# Patient Record
Sex: Female | Born: 1955 | Race: Black or African American | Hispanic: No | Marital: Married | Smoking: Never smoker
Health system: Southern US, Community
[De-identification: ages and names within clinical notes are randomized; demographics above are authoritative.]

## PROBLEM LIST (undated history)

## (undated) DIAGNOSIS — Z8739 Personal history of other diseases of the musculoskeletal system and connective tissue: Secondary | ICD-10-CM

## (undated) DIAGNOSIS — J45909 Unspecified asthma, uncomplicated: Secondary | ICD-10-CM

## (undated) DIAGNOSIS — Z1371 Encounter for nonprocreative screening for genetic disease carrier status: Secondary | ICD-10-CM

## (undated) DIAGNOSIS — D759 Disease of blood and blood-forming organs, unspecified: Secondary | ICD-10-CM

## (undated) DIAGNOSIS — N189 Chronic kidney disease, unspecified: Secondary | ICD-10-CM

## (undated) DIAGNOSIS — D693 Immune thrombocytopenic purpura: Secondary | ICD-10-CM

## (undated) DIAGNOSIS — Z803 Family history of malignant neoplasm of breast: Secondary | ICD-10-CM

## (undated) HISTORY — DX: Immune thrombocytopenic purpura: D69.3

## (undated) HISTORY — PX: HAND SURGERY: SHX662

## (undated) HISTORY — DX: Personal history of other diseases of the musculoskeletal system and connective tissue: Z87.39

## (undated) HISTORY — PX: BREAST EXCISIONAL BIOPSY: SUR124

## (undated) HISTORY — PX: TUBAL LIGATION: SHX77

## (undated) HISTORY — DX: Chronic kidney disease, unspecified: N18.9

## (undated) HISTORY — DX: Encounter for nonprocreative screening for genetic disease carrier status: Z13.71

## (undated) HISTORY — DX: Family history of malignant neoplasm of breast: Z80.3

---

## 1989-08-11 HISTORY — PX: OTHER SURGICAL HISTORY: SHX169

## 2004-05-11 ENCOUNTER — Ambulatory Visit: Payer: Self-pay | Admitting: Internal Medicine

## 2004-06-24 ENCOUNTER — Ambulatory Visit: Payer: Self-pay | Admitting: Internal Medicine

## 2004-07-11 ENCOUNTER — Ambulatory Visit: Payer: Self-pay | Admitting: Internal Medicine

## 2004-08-21 ENCOUNTER — Ambulatory Visit: Payer: Self-pay | Admitting: Internal Medicine

## 2004-09-11 ENCOUNTER — Ambulatory Visit: Payer: Self-pay | Admitting: Internal Medicine

## 2004-09-13 ENCOUNTER — Ambulatory Visit: Payer: Self-pay | Admitting: Unknown Physician Specialty

## 2004-10-21 ENCOUNTER — Ambulatory Visit: Payer: Self-pay | Admitting: Internal Medicine

## 2004-11-09 ENCOUNTER — Ambulatory Visit: Payer: Self-pay | Admitting: Internal Medicine

## 2004-12-19 ENCOUNTER — Ambulatory Visit: Payer: Self-pay | Admitting: Internal Medicine

## 2005-01-09 ENCOUNTER — Ambulatory Visit: Payer: Self-pay | Admitting: Internal Medicine

## 2005-02-21 ENCOUNTER — Ambulatory Visit: Payer: Self-pay | Admitting: Internal Medicine

## 2005-03-11 ENCOUNTER — Ambulatory Visit: Payer: Self-pay | Admitting: Internal Medicine

## 2005-05-22 ENCOUNTER — Ambulatory Visit: Payer: Self-pay | Admitting: Internal Medicine

## 2005-06-11 ENCOUNTER — Ambulatory Visit: Payer: Self-pay | Admitting: Internal Medicine

## 2005-08-20 ENCOUNTER — Ambulatory Visit: Payer: Self-pay | Admitting: Internal Medicine

## 2005-09-11 ENCOUNTER — Ambulatory Visit: Payer: Self-pay | Admitting: Internal Medicine

## 2005-10-13 ENCOUNTER — Ambulatory Visit: Payer: Self-pay | Admitting: Unknown Physician Specialty

## 2005-11-18 ENCOUNTER — Ambulatory Visit: Payer: Self-pay | Admitting: Internal Medicine

## 2005-12-09 ENCOUNTER — Ambulatory Visit: Payer: Self-pay | Admitting: Internal Medicine

## 2006-02-27 ENCOUNTER — Ambulatory Visit: Payer: Self-pay | Admitting: Internal Medicine

## 2006-03-11 ENCOUNTER — Ambulatory Visit: Payer: Self-pay | Admitting: Internal Medicine

## 2006-05-28 ENCOUNTER — Ambulatory Visit: Payer: Self-pay | Admitting: Internal Medicine

## 2006-06-11 ENCOUNTER — Ambulatory Visit: Payer: Self-pay | Admitting: Internal Medicine

## 2006-09-03 ENCOUNTER — Ambulatory Visit: Payer: Self-pay | Admitting: Internal Medicine

## 2006-09-11 ENCOUNTER — Ambulatory Visit: Payer: Self-pay | Admitting: Internal Medicine

## 2006-10-26 ENCOUNTER — Ambulatory Visit: Payer: Self-pay | Admitting: Unknown Physician Specialty

## 2006-11-23 ENCOUNTER — Ambulatory Visit: Payer: Self-pay | Admitting: Internal Medicine

## 2006-12-10 ENCOUNTER — Ambulatory Visit: Payer: Self-pay | Admitting: Internal Medicine

## 2007-02-09 ENCOUNTER — Ambulatory Visit: Payer: Self-pay | Admitting: Internal Medicine

## 2007-02-22 ENCOUNTER — Ambulatory Visit: Payer: Self-pay | Admitting: Internal Medicine

## 2007-03-12 ENCOUNTER — Ambulatory Visit: Payer: Self-pay | Admitting: Internal Medicine

## 2007-06-21 ENCOUNTER — Ambulatory Visit: Payer: Self-pay | Admitting: Internal Medicine

## 2007-07-12 ENCOUNTER — Ambulatory Visit: Payer: Self-pay | Admitting: Internal Medicine

## 2007-10-19 ENCOUNTER — Ambulatory Visit: Payer: Self-pay | Admitting: Internal Medicine

## 2007-11-01 ENCOUNTER — Ambulatory Visit: Payer: Self-pay | Admitting: Unknown Physician Specialty

## 2007-11-10 ENCOUNTER — Ambulatory Visit: Payer: Self-pay | Admitting: Internal Medicine

## 2008-02-09 ENCOUNTER — Ambulatory Visit: Payer: Self-pay | Admitting: Internal Medicine

## 2008-02-22 ENCOUNTER — Ambulatory Visit: Payer: Self-pay | Admitting: Internal Medicine

## 2008-03-11 ENCOUNTER — Ambulatory Visit: Payer: Self-pay | Admitting: Internal Medicine

## 2008-05-31 ENCOUNTER — Ambulatory Visit: Payer: Self-pay | Admitting: Internal Medicine

## 2008-06-11 ENCOUNTER — Ambulatory Visit: Payer: Self-pay | Admitting: Internal Medicine

## 2008-08-29 ENCOUNTER — Ambulatory Visit: Payer: Self-pay | Admitting: Internal Medicine

## 2008-09-06 ENCOUNTER — Ambulatory Visit: Payer: Self-pay | Admitting: Internal Medicine

## 2008-09-11 ENCOUNTER — Ambulatory Visit: Payer: Self-pay | Admitting: Internal Medicine

## 2008-09-18 HISTORY — PX: COLONOSCOPY: SHX174

## 2008-10-09 ENCOUNTER — Ambulatory Visit: Payer: Self-pay | Admitting: Internal Medicine

## 2008-11-05 ENCOUNTER — Ambulatory Visit: Payer: Self-pay | Admitting: Unknown Physician Specialty

## 2008-11-06 ENCOUNTER — Ambulatory Visit: Payer: Self-pay | Admitting: Unknown Physician Specialty

## 2008-11-09 ENCOUNTER — Ambulatory Visit: Payer: Self-pay | Admitting: Internal Medicine

## 2008-11-13 ENCOUNTER — Ambulatory Visit: Payer: Self-pay | Admitting: Unknown Physician Specialty

## 2008-12-09 ENCOUNTER — Ambulatory Visit: Payer: Self-pay | Admitting: Internal Medicine

## 2009-01-09 ENCOUNTER — Ambulatory Visit: Payer: Self-pay | Admitting: Internal Medicine

## 2009-02-08 ENCOUNTER — Ambulatory Visit: Payer: Self-pay | Admitting: Internal Medicine

## 2009-03-11 ENCOUNTER — Ambulatory Visit: Payer: Self-pay | Admitting: Internal Medicine

## 2009-04-11 ENCOUNTER — Ambulatory Visit: Payer: Self-pay | Admitting: Internal Medicine

## 2009-05-11 ENCOUNTER — Ambulatory Visit: Payer: Self-pay | Admitting: Internal Medicine

## 2009-06-11 ENCOUNTER — Ambulatory Visit: Payer: Self-pay | Admitting: Internal Medicine

## 2009-07-11 ENCOUNTER — Ambulatory Visit: Payer: Self-pay | Admitting: Internal Medicine

## 2009-08-11 ENCOUNTER — Ambulatory Visit: Payer: Self-pay | Admitting: Internal Medicine

## 2009-09-11 ENCOUNTER — Ambulatory Visit: Payer: Self-pay | Admitting: Internal Medicine

## 2009-10-09 ENCOUNTER — Ambulatory Visit: Payer: Self-pay | Admitting: Internal Medicine

## 2009-11-09 ENCOUNTER — Ambulatory Visit: Payer: Self-pay | Admitting: Internal Medicine

## 2009-12-03 ENCOUNTER — Ambulatory Visit: Payer: Self-pay | Admitting: Unknown Physician Specialty

## 2009-12-09 ENCOUNTER — Ambulatory Visit: Payer: Self-pay | Admitting: Internal Medicine

## 2010-01-09 ENCOUNTER — Ambulatory Visit: Payer: Self-pay | Admitting: Internal Medicine

## 2010-01-23 ENCOUNTER — Ambulatory Visit: Payer: Self-pay | Admitting: Internal Medicine

## 2010-02-08 ENCOUNTER — Ambulatory Visit: Payer: Self-pay | Admitting: Internal Medicine

## 2010-03-11 ENCOUNTER — Ambulatory Visit: Payer: Self-pay | Admitting: Internal Medicine

## 2010-04-11 ENCOUNTER — Ambulatory Visit: Payer: Self-pay | Admitting: Internal Medicine

## 2010-05-11 ENCOUNTER — Ambulatory Visit: Payer: Self-pay | Admitting: Internal Medicine

## 2010-06-11 ENCOUNTER — Ambulatory Visit: Payer: Self-pay | Admitting: Internal Medicine

## 2010-07-11 ENCOUNTER — Ambulatory Visit: Payer: Self-pay | Admitting: Internal Medicine

## 2010-08-11 ENCOUNTER — Ambulatory Visit: Payer: Self-pay | Admitting: Internal Medicine

## 2010-09-11 ENCOUNTER — Ambulatory Visit: Payer: Self-pay | Admitting: Internal Medicine

## 2010-10-10 ENCOUNTER — Ambulatory Visit: Payer: Self-pay | Admitting: Internal Medicine

## 2010-11-10 ENCOUNTER — Ambulatory Visit: Payer: Self-pay | Admitting: Internal Medicine

## 2010-12-10 ENCOUNTER — Ambulatory Visit: Payer: Self-pay | Admitting: Internal Medicine

## 2010-12-11 ENCOUNTER — Ambulatory Visit: Payer: Self-pay | Admitting: Unknown Physician Specialty

## 2011-01-10 ENCOUNTER — Ambulatory Visit: Payer: Self-pay | Admitting: Internal Medicine

## 2011-02-09 ENCOUNTER — Ambulatory Visit: Payer: Self-pay | Admitting: Internal Medicine

## 2011-03-12 ENCOUNTER — Ambulatory Visit: Payer: Self-pay | Admitting: Internal Medicine

## 2011-04-12 ENCOUNTER — Ambulatory Visit: Payer: Self-pay | Admitting: Internal Medicine

## 2011-05-12 ENCOUNTER — Ambulatory Visit: Payer: Self-pay | Admitting: Internal Medicine

## 2011-06-12 ENCOUNTER — Ambulatory Visit: Payer: Self-pay | Admitting: Internal Medicine

## 2011-07-12 ENCOUNTER — Ambulatory Visit: Payer: Self-pay | Admitting: Internal Medicine

## 2011-08-12 ENCOUNTER — Ambulatory Visit: Payer: Self-pay | Admitting: Internal Medicine

## 2011-08-21 LAB — PLATELET COUNT: Platelet: 123 10*3/uL — ABNORMAL LOW (ref 150–440)

## 2011-09-12 ENCOUNTER — Ambulatory Visit: Payer: Self-pay | Admitting: Internal Medicine

## 2011-09-23 LAB — CBC CANCER CENTER
Basophil %: 1.2 %
Comment - H1-Com1: NORMAL
Eosinophil #: 0.2 x10 3/mm (ref 0.0–0.7)
Eosinophil %: 3.9 %
HCT: 39.3 % (ref 35.0–47.0)
Lymphocyte #: 1.8 x10 3/mm (ref 1.0–3.6)
Lymphocyte %: 30.9 %
Lymphocytes: 37 %
MCH: 30.3 pg (ref 26.0–34.0)
MCHC: 32.6 g/dL (ref 32.0–36.0)
Monocyte #: 0.4 x10 3/mm (ref 0.0–0.7)
Neutrophil %: 57 %
Platelet: 123 x10 3/mm — ABNORMAL LOW (ref 150–440)
RBC: 4.23 10*6/uL (ref 3.80–5.20)
Segmented Neutrophils: 53 %

## 2011-10-10 ENCOUNTER — Ambulatory Visit: Payer: Self-pay | Admitting: Internal Medicine

## 2011-10-22 LAB — CANCER CTR PLATELET CT: Platelet: 142 x10 3/mm — ABNORMAL LOW (ref 150–440)

## 2011-11-10 ENCOUNTER — Ambulatory Visit: Payer: Self-pay | Admitting: Internal Medicine

## 2011-12-10 ENCOUNTER — Ambulatory Visit: Payer: Self-pay | Admitting: Internal Medicine

## 2011-12-16 ENCOUNTER — Ambulatory Visit: Payer: Self-pay | Admitting: Unknown Physician Specialty

## 2011-12-22 ENCOUNTER — Ambulatory Visit: Payer: Self-pay | Admitting: Unknown Physician Specialty

## 2011-12-23 LAB — CBC CANCER CENTER
Basophil %: 0.8 %
HCT: 35.6 % (ref 35.0–47.0)
Lymphocyte #: 2.6 x10 3/mm (ref 1.0–3.6)
Lymphocyte %: 43.2 %
MCH: 30.8 pg (ref 26.0–34.0)
MCHC: 33.7 g/dL (ref 32.0–36.0)
Monocyte #: 0.6 x10 3/mm (ref 0.2–0.9)
Monocyte %: 9.3 %
Neutrophil %: 41.6 %
Platelet: 123 x10 3/mm — ABNORMAL LOW (ref 150–440)
RDW: 13.5 % (ref 11.5–14.5)
WBC: 6 x10 3/mm (ref 3.6–11.0)

## 2012-01-10 ENCOUNTER — Ambulatory Visit: Payer: Self-pay | Admitting: Internal Medicine

## 2012-02-09 ENCOUNTER — Ambulatory Visit: Payer: Self-pay | Admitting: Internal Medicine

## 2012-02-27 LAB — CANCER CTR PLATELET CT: Platelet: 36 x10 3/mm — ABNORMAL LOW (ref 150–440)

## 2012-03-02 LAB — CANCER CTR PLATELET CT: Platelet: 94 x10 3/mm — ABNORMAL LOW (ref 150–440)

## 2012-03-09 LAB — CANCER CTR PLATELET CT: Platelet: 151 x10 3/mm (ref 150–440)

## 2012-03-11 ENCOUNTER — Ambulatory Visit: Payer: Self-pay | Admitting: Internal Medicine

## 2012-03-30 ENCOUNTER — Ambulatory Visit: Payer: Self-pay | Admitting: Internal Medicine

## 2012-03-30 LAB — CBC CANCER CENTER
Basophil #: 0.1 x10 3/mm (ref 0.0–0.1)
Eosinophil #: 0.3 x10 3/mm (ref 0.0–0.7)
HCT: 36.8 % (ref 35.0–47.0)
HGB: 12.3 g/dL (ref 12.0–16.0)
Lymphocyte %: 30.7 %
MCH: 30.6 pg (ref 26.0–34.0)
MCHC: 33.4 g/dL (ref 32.0–36.0)
Neutrophil %: 55.6 %
Platelet: 97 x10 3/mm — ABNORMAL LOW (ref 150–440)
RDW: 13.7 % (ref 11.5–14.5)
WBC: 5.5 x10 3/mm (ref 3.6–11.0)

## 2012-03-30 LAB — COMPREHENSIVE METABOLIC PANEL
Alkaline Phosphatase: 105 U/L (ref 50–136)
Anion Gap: 8 (ref 7–16)
Bilirubin,Total: 0.4 mg/dL (ref 0.2–1.0)
Calcium, Total: 9.4 mg/dL (ref 8.5–10.1)
Chloride: 106 mmol/L (ref 98–107)
Creatinine: 1.13 mg/dL (ref 0.60–1.30)
EGFR (African American): >60
Glucose: 119 mg/dL — ABNORMAL HIGH (ref 65–99)
Osmolality: 285 (ref 275–301)
Potassium: 3.9 mmol/L (ref 3.5–5.1)
SGOT(AST): 28 U/L (ref 15–37)
Sodium: 142 mmol/L (ref 136–145)
Total Protein: 7.2 g/dL (ref 6.4–8.2)

## 2012-04-11 ENCOUNTER — Ambulatory Visit: Payer: Self-pay | Admitting: Internal Medicine

## 2012-04-27 LAB — CBC CANCER CENTER
Basophil %: 1.6 %
Eosinophil #: 0.2 x10 3/mm (ref 0.0–0.7)
Eosinophil %: 4.4 %
HGB: 12.4 g/dL (ref 12.0–16.0)
Lymphocyte %: 25.6 %
Monocyte %: 12.8 %
Neutrophil %: 55.6 %
RBC: 3.98 10*6/uL (ref 3.80–5.20)
WBC: 5.3 x10 3/mm (ref 3.6–11.0)

## 2012-05-11 ENCOUNTER — Ambulatory Visit: Payer: Self-pay | Admitting: Internal Medicine

## 2012-05-11 LAB — CANCER CTR PLATELET CT: Platelet: 170 x10 3/mm (ref 150–440)

## 2012-06-08 LAB — CANCER CTR PLATELET CT: Platelet: 106 x10 3/mm — ABNORMAL LOW (ref 150–440)

## 2012-06-11 ENCOUNTER — Ambulatory Visit: Payer: Self-pay | Admitting: Internal Medicine

## 2012-07-11 ENCOUNTER — Ambulatory Visit: Payer: Self-pay | Admitting: Internal Medicine

## 2012-08-11 ENCOUNTER — Ambulatory Visit: Payer: Self-pay | Admitting: Internal Medicine

## 2012-09-11 ENCOUNTER — Ambulatory Visit: Payer: Self-pay | Admitting: Internal Medicine

## 2012-10-05 LAB — CBC CANCER CENTER
Basophil #: 0.1 x10 3/mm (ref 0.0–0.1)
Basophil %: 1.2 %
Eosinophil #: 0.3 x10 3/mm (ref 0.0–0.7)
Eosinophil %: 4.7 %
HCT: 36.2 % (ref 35.0–47.0)
HGB: 12.3 g/dL (ref 12.0–16.0)
Lymphocyte #: 1.7 x10 3/mm (ref 1.0–3.6)
MCH: 30.2 pg (ref 26.0–34.0)
MCHC: 33.9 g/dL (ref 32.0–36.0)
MCV: 89 fL (ref 80–100)
RBC: 4.06 10*6/uL (ref 3.80–5.20)
RDW: 13.4 % (ref 11.5–14.5)
WBC: 5.9 x10 3/mm (ref 3.6–11.0)

## 2012-10-09 ENCOUNTER — Ambulatory Visit: Payer: Self-pay | Admitting: Internal Medicine

## 2012-11-02 LAB — CANCER CTR PLATELET CT: Platelet: 85 x10 3/mm — ABNORMAL LOW (ref 150–440)

## 2012-11-09 ENCOUNTER — Ambulatory Visit: Payer: Self-pay | Admitting: Internal Medicine

## 2012-12-09 ENCOUNTER — Ambulatory Visit: Payer: Self-pay | Admitting: Internal Medicine

## 2012-12-21 ENCOUNTER — Ambulatory Visit: Payer: Self-pay | Admitting: Unknown Physician Specialty

## 2013-01-09 ENCOUNTER — Ambulatory Visit: Payer: Self-pay | Admitting: Internal Medicine

## 2013-02-22 ENCOUNTER — Ambulatory Visit: Payer: Self-pay | Admitting: Internal Medicine

## 2013-02-22 LAB — CANCER CTR PLATELET CT: Platelet: 108 x10 3/mm — ABNORMAL LOW (ref 150–440)

## 2013-02-24 ENCOUNTER — Ambulatory Visit: Payer: Self-pay | Admitting: Physical Medicine and Rehabilitation

## 2013-03-11 ENCOUNTER — Ambulatory Visit: Payer: Self-pay | Admitting: Internal Medicine

## 2013-03-29 LAB — CBC CANCER CENTER
Basophil #: 0.1 x10 3/mm (ref 0.0–0.1)
Basophil %: 1.3 %
Eosinophil %: 2.4 %
HCT: 35.9 % (ref 35.0–47.0)
Lymphocyte #: 2.3 x10 3/mm (ref 1.0–3.6)
Lymphocyte %: 34.5 %
MCH: 29.9 pg (ref 26.0–34.0)
MCV: 88 fL (ref 80–100)
Neutrophil #: 3.5 x10 3/mm (ref 1.4–6.5)
Neutrophil %: 51.9 %
Platelet: 84 x10 3/mm — ABNORMAL LOW (ref 150–440)
RBC: 4.07 10*6/uL (ref 3.80–5.20)

## 2013-04-11 ENCOUNTER — Ambulatory Visit: Payer: Self-pay | Admitting: Internal Medicine

## 2013-05-11 ENCOUNTER — Ambulatory Visit: Payer: Self-pay | Admitting: Internal Medicine

## 2013-06-11 ENCOUNTER — Ambulatory Visit: Payer: Self-pay | Admitting: Internal Medicine

## 2013-06-28 LAB — CANCER CTR PLATELET CT: Platelet: 231 x10 3/mm (ref 150–440)

## 2013-07-11 ENCOUNTER — Ambulatory Visit: Payer: Self-pay | Admitting: Internal Medicine

## 2013-08-09 LAB — CANCER CTR PLATELET CT: Platelet: 124 x10 3/mm — ABNORMAL LOW (ref 150–440)

## 2013-08-11 ENCOUNTER — Ambulatory Visit: Payer: Self-pay | Admitting: Internal Medicine

## 2013-09-19 ENCOUNTER — Ambulatory Visit: Payer: Self-pay | Admitting: Internal Medicine

## 2013-09-20 LAB — CANCER CTR PLATELET CT: PLATELETS: 100 x10 3/mm — AB (ref 150–440)

## 2013-10-09 ENCOUNTER — Ambulatory Visit: Payer: Self-pay | Admitting: Internal Medicine

## 2013-10-20 ENCOUNTER — Ambulatory Visit: Payer: Self-pay | Admitting: Emergency Medicine

## 2013-10-20 LAB — PREGNANCY, URINE: Pregnancy Test, Urine: NEGATIVE m[IU]/mL

## 2013-10-26 ENCOUNTER — Other Ambulatory Visit: Payer: Self-pay | Admitting: Orthopedic Surgery

## 2013-11-01 LAB — CANCER CTR PLATELET CT: PLATELETS: 87 x10 3/mm — AB (ref 150–440)

## 2013-11-09 ENCOUNTER — Ambulatory Visit: Payer: Self-pay | Admitting: Internal Medicine

## 2013-11-15 ENCOUNTER — Encounter (HOSPITAL_COMMUNITY): Payer: Self-pay | Admitting: Pharmacy Technician

## 2013-11-18 ENCOUNTER — Encounter (HOSPITAL_COMMUNITY): Payer: Self-pay

## 2013-11-18 ENCOUNTER — Encounter (HOSPITAL_COMMUNITY)
Admission: RE | Admit: 2013-11-18 | Discharge: 2013-11-18 | Disposition: A | Discharge status: Home / Self Care | Payer: 59 | Source: Ambulatory Visit | Attending: Orthopedic Surgery | Admitting: Orthopedic Surgery

## 2013-11-18 DIAGNOSIS — Z01810 Encounter for preprocedural cardiovascular examination: Secondary | ICD-10-CM | POA: Insufficient documentation

## 2013-11-18 DIAGNOSIS — Z01812 Encounter for preprocedural laboratory examination: Secondary | ICD-10-CM | POA: Insufficient documentation

## 2013-11-18 HISTORY — DX: Disease of blood and blood-forming organs, unspecified: D75.9

## 2013-11-18 HISTORY — DX: Unspecified asthma, uncomplicated: J45.909

## 2013-11-18 LAB — SURGICAL PCR SCREEN
MRSA, PCR: NEGATIVE
STAPHYLOCOCCUS AUREUS: NEGATIVE

## 2013-11-18 LAB — URINALYSIS, ROUTINE W REFLEX MICROSCOPIC
Bilirubin Urine: NEGATIVE
GLUCOSE, UA: NEGATIVE mg/dL
Hgb urine dipstick: NEGATIVE
Ketones, ur: NEGATIVE mg/dL
Nitrite: NEGATIVE
PH: 6 (ref 5.0–8.0)
Protein, ur: NEGATIVE mg/dL
SPECIFIC GRAVITY, URINE: 1.016 (ref 1.005–1.030)
Urobilinogen, UA: 0.2 mg/dL (ref 0.0–1.0)

## 2013-11-18 LAB — COMPREHENSIVE METABOLIC PANEL
ALT: 27 U/L (ref 0–35)
AST: 25 U/L (ref 0–37)
Albumin: 3.7 g/dL (ref 3.5–5.2)
Alkaline Phosphatase: 80 U/L (ref 39–117)
BUN: 18 mg/dL (ref 6–23)
CO2: 25 meq/L (ref 19–32)
CREATININE: 1.03 mg/dL (ref 0.50–1.10)
Calcium: 10 mg/dL (ref 8.4–10.5)
Chloride: 104 mEq/L (ref 96–112)
GFR, EST AFRICAN AMERICAN: 70 mL/min — AB (ref >90)
GFR, EST NON AFRICAN AMERICAN: 60 mL/min — AB (ref >90)
GLUCOSE: 83 mg/dL (ref 70–99)
Potassium: 4 mEq/L (ref 3.7–5.3)
SODIUM: 142 meq/L (ref 137–147)
TOTAL PROTEIN: 6.9 g/dL (ref 6.0–8.3)
Total Bilirubin: 0.4 mg/dL (ref 0.3–1.2)

## 2013-11-18 LAB — CBC WITH DIFFERENTIAL/PLATELET
BASOS PCT: 1 % (ref 0–1)
Basophils Absolute: 0.1 10*3/uL (ref 0.0–0.1)
Eosinophils Absolute: 0.2 10*3/uL (ref 0.0–0.7)
Eosinophils Relative: 4 % (ref 0–5)
HEMATOCRIT: 32.9 % — AB (ref 36.0–46.0)
Hemoglobin: 11.7 g/dL — ABNORMAL LOW (ref 12.0–15.0)
LYMPHS ABS: 2 10*3/uL (ref 0.7–4.0)
Lymphocytes Relative: 37 % (ref 12–46)
MCH: 30.5 pg (ref 26.0–34.0)
MCHC: 35.6 g/dL (ref 30.0–36.0)
MCV: 85.7 fL (ref 78.0–100.0)
MONO ABS: 0.6 10*3/uL (ref 0.1–1.0)
MONOS PCT: 10 % (ref 3–12)
NEUTROS ABS: 2.6 10*3/uL (ref 1.7–7.7)
Neutrophils Relative %: 48 % (ref 43–77)
PLATELETS: 84 10*3/uL — AB (ref 150–400)
RBC: 3.84 MIL/uL — ABNORMAL LOW (ref 3.87–5.11)
RDW: 14.1 % (ref 11.5–15.5)
WBC: 5.5 10*3/uL (ref 4.0–10.5)

## 2013-11-18 LAB — APTT: aPTT: 27 seconds (ref 24–37)

## 2013-11-18 LAB — PROTIME-INR
INR: 0.99 (ref 0.00–1.49)
Prothrombin Time: 12.9 seconds (ref 11.6–15.2)

## 2013-11-18 LAB — ABO/RH: ABO/RH(D): O POS

## 2013-11-18 LAB — URINE MICROSCOPIC-ADD ON

## 2013-11-18 NOTE — Progress Notes (Addendum)
Pt has dx of ITP--she just saw her hematologist, Dr. Heinz Knucklesobt Gittin, MD from [facility] [place] [facility] I called platelet count (84)  results back to Dr. Luiz BlareGraves' office.   DA

## 2013-11-21 NOTE — Progress Notes (Addendum)
Anesthesia Chart Review: Patient is a 58 year old female scheduled for right THA, anterior approach on 11/28/13 by Dr. Luiz BlareGraves.   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 Hematologist is Dr. Benita Gutterobert Gittin. No PCP is listed.  EKG on 11/18/13 showed NSR.  CXR on 11/18/13 showed no acute cardiopulmonary disease.  Preoperative labs noted. PLT count 84K. H/H 11.7/32.9. PT/PTT WNL.  UA showed moderate leukocytes, negative nitrites.  I spoke with Darl PikesSusan at Dr. Luiz BlareGraves' office earlier this week.  I notified her of patient's UA and PLT results. They have already spoken with patient who reported that usual PLT count runs ~ 80-120K.  I also received records from Dr. Neale BurlyGitten, last visit was in 06/2013.  Her ITP has been steroid responsive in the past. Her last two PLT count results there were 100K on 09/20/13 and 87K on 11/01/13. Dr. Luiz BlareGraves is comfortable proceeding with PLT count at current level.  I updated anesthesiologist Dr. Chaney MallingHodierne.     Velna Ochsllison Chrisie Jankovich, PA-C [facility]esthesiology Phone [phone] 11/24/2013 3:41 PM

## 2013-11-27 MED ORDER — CEFAZOLIN SODIUM-DEXTROSE 2-3 GM-% IV SOLR: 2.0000 g | INTRAVENOUS | Status: DC

## 2013-11-27 MED ORDER — POVIDONE-IODINE 7.5 % EX SOLN
Freq: Once | CUTANEOUS | Status: DC
Start: 2013-11-27 — End: 2013-11-28
  Filled 2013-11-27: qty 118

## 2013-11-28 ENCOUNTER — Encounter (HOSPITAL_COMMUNITY): Payer: 59 | Admitting: Vascular Surgery

## 2013-11-28 ENCOUNTER — Inpatient Hospital Stay (HOSPITAL_COMMUNITY): Payer: 59 | Admitting: Anesthesiology

## 2013-11-28 ENCOUNTER — Encounter (HOSPITAL_COMMUNITY): Payer: Self-pay | Admitting: *Deleted

## 2013-11-28 ENCOUNTER — Inpatient Hospital Stay (HOSPITAL_COMMUNITY)
Admission: RE | Admit: 2013-11-28 | Discharge: 2013-11-30 | DRG: 470 | Disposition: A | Discharge status: Home / Self Care | Payer: 59 | Source: Ambulatory Visit | Attending: Orthopedic Surgery | Admitting: Orthopedic Surgery

## 2013-11-28 ENCOUNTER — Inpatient Hospital Stay (HOSPITAL_COMMUNITY): Payer: 59

## 2013-11-28 ENCOUNTER — Encounter (HOSPITAL_COMMUNITY)
Admission: RE | Disposition: A | Discharge status: Home / Self Care | Payer: Self-pay | Source: Ambulatory Visit | Attending: Orthopedic Surgery

## 2013-11-28 DIAGNOSIS — M1610 Unilateral primary osteoarthritis, unspecified hip: Principal | ICD-10-CM | POA: Diagnosis present

## 2013-11-28 DIAGNOSIS — D696 Thrombocytopenia, unspecified: Secondary | ICD-10-CM | POA: Diagnosis present

## 2013-11-28 DIAGNOSIS — M169 Osteoarthritis of hip, unspecified: Principal | ICD-10-CM | POA: Diagnosis present

## 2013-11-28 DIAGNOSIS — J45909 Unspecified asthma, uncomplicated: Secondary | ICD-10-CM | POA: Diagnosis present

## 2013-11-28 DIAGNOSIS — M1611 Unilateral primary osteoarthritis, right hip: Secondary | ICD-10-CM | POA: Diagnosis present

## 2013-11-28 DIAGNOSIS — D62 Acute posthemorrhagic anemia: Secondary | ICD-10-CM | POA: Diagnosis not present

## 2013-11-28 HISTORY — PX: TOTAL HIP ARTHROPLASTY: SHX124

## 2013-11-28 LAB — CBC
HCT: 31.2 % — ABNORMAL LOW (ref 36.0–46.0)
HEMOGLOBIN: 11 g/dL — AB (ref 12.0–15.0)
MCH: 30.2 pg (ref 26.0–34.0)
MCHC: 35.3 g/dL (ref 30.0–36.0)
MCV: 85.7 fL (ref 78.0–100.0)
Platelets: 66 10*3/uL — ABNORMAL LOW (ref 150–400)
RBC: 3.64 MIL/uL — AB (ref 3.87–5.11)
RDW: 13.7 % (ref 11.5–15.5)
WBC: 14.9 10*3/uL — ABNORMAL HIGH (ref 4.0–10.5)

## 2013-11-28 LAB — PREPARE RBC (CROSSMATCH)

## 2013-11-28 SURGERY — ARTHROPLASTY, HIP, TOTAL, ANTERIOR APPROACH
Anesthesia: General | Site: Hip | Laterality: Right

## 2013-11-28 MED ORDER — DEXAMETHASONE SODIUM PHOSPHATE 10 MG/ML IJ SOLN
10.0000 mg | Freq: Three times a day (TID) | INTRAMUSCULAR | Status: AC
  Filled 2013-11-28 (×3): qty 1

## 2013-11-28 MED ORDER — OXYCODONE-ACETAMINOPHEN 5-325 MG PO TABS: 1.0000 | ORAL_TABLET | Freq: Four times a day (QID) | ORAL | Status: DC | PRN

## 2013-11-28 MED ORDER — TRANEXAMIC ACID 100 MG/ML IV SOLN
1000.0000 mg | INTRAVENOUS | Status: DC
  Filled 2013-11-28: qty 10

## 2013-11-28 MED ORDER — TRANEXAMIC ACID 100 MG/ML IV SOLN
1000.0000 mg | INTRAVENOUS | Status: DC | PRN
  Administered 2013-11-28: 1000 mg via INTRAVENOUS

## 2013-11-28 MED ORDER — METHOCARBAMOL 100 MG/ML IJ SOLN
500.0000 mg | Freq: Four times a day (QID) | INTRAMUSCULAR | Status: DC | PRN
  Filled 2013-11-28: qty 5

## 2013-11-28 MED ORDER — POLYETHYLENE GLYCOL 3350 17 G PO PACK: 17.0000 g | PACK | Freq: Every day | ORAL | Status: DC | PRN

## 2013-11-28 MED ORDER — 0.9 % SODIUM CHLORIDE (POUR BTL) OPTIME
TOPICAL | Status: DC | PRN
  Administered 2013-11-28: 1000 mL

## 2013-11-28 MED ORDER — CEFAZOLIN SODIUM-DEXTROSE 2-3 GM-% IV SOLR
2.0000 g | Freq: Four times a day (QID) | INTRAVENOUS | Status: AC
  Administered 2013-11-28 – 2013-11-29 (×2): 2 g via INTRAVENOUS
  Filled 2013-11-28 (×3): qty 50

## 2013-11-28 MED ORDER — LIDOCAINE HCL (CARDIAC) 20 MG/ML IV SOLN
INTRAVENOUS | Status: DC | PRN
  Administered 2013-11-28: 60 mg via INTRAVENOUS

## 2013-11-28 MED ORDER — FERROUS SULFATE 325 (65 FE) MG PO TABS
325.0000 mg | ORAL_TABLET | Freq: Two times a day (BID) | ORAL | Status: DC
  Administered 2013-11-29 – 2013-11-30 (×3): 325 mg via ORAL
  Filled 2013-11-28 (×5): qty 1

## 2013-11-28 MED ORDER — DOCUSATE SODIUM 100 MG PO CAPS
100.0000 mg | ORAL_CAPSULE | Freq: Two times a day (BID) | ORAL | Status: DC
  Administered 2013-11-28 – 2013-11-30 (×4): 100 mg via ORAL
  Filled 2013-11-28 (×5): qty 1

## 2013-11-28 MED ORDER — PROPOFOL 10 MG/ML IV BOLUS
INTRAVENOUS | Status: AC
Start: 2013-11-28 — End: 2013-11-28
  Filled 2013-11-28: qty 20

## 2013-11-28 MED ORDER — DEXAMETHASONE 4 MG PO TABS
10.0000 mg | ORAL_TABLET | Freq: Three times a day (TID) | ORAL | Status: AC
  Administered 2013-11-28 – 2013-11-29 (×3): 10 mg via ORAL
  Filled 2013-11-28 (×3): qty 1

## 2013-11-28 MED ORDER — ONDANSETRON HCL 4 MG/2ML IJ SOLN: 4.0000 mg | Freq: Four times a day (QID) | INTRAMUSCULAR | Status: DC | PRN

## 2013-11-28 MED ORDER — FENTANYL CITRATE 0.05 MG/ML IJ SOLN
INTRAMUSCULAR | Status: DC | PRN
  Administered 2013-11-28: 100 ug via INTRAVENOUS
  Administered 2013-11-28: 50 ug via INTRAVENOUS
  Administered 2013-11-28: 100 ug via INTRAVENOUS
  Administered 2013-11-28: 150 ug via INTRAVENOUS
  Administered 2013-11-28: 100 ug via INTRAVENOUS

## 2013-11-28 MED ORDER — HYDROMORPHONE HCL PF 1 MG/ML IJ SOLN
0.5000 mg | INTRAMUSCULAR | Status: DC | PRN
  Administered 2013-11-28: 0.5 mg via INTRAVENOUS

## 2013-11-28 MED ORDER — ROCURONIUM BROMIDE 100 MG/10ML IV SOLN
INTRAVENOUS | Status: DC | PRN
  Administered 2013-11-28 (×2): 10 mg via INTRAVENOUS
  Administered 2013-11-28: 20 mg via INTRAVENOUS
  Administered 2013-11-28: 10 mg via INTRAVENOUS
  Administered 2013-11-28: 50 mg via INTRAVENOUS

## 2013-11-28 MED ORDER — MENTHOL 3 MG MT LOZG
1.0000 | LOZENGE | OROMUCOSAL | Status: DC | PRN
  Administered 2013-11-29: 3 mg via ORAL
  Filled 2013-11-28 (×2): qty 9

## 2013-11-28 MED ORDER — PHENOL 1.4 % MT LIQD: 1.0000 | OROMUCOSAL | Status: DC | PRN

## 2013-11-28 MED ORDER — SODIUM CHLORIDE 0.9 % IV SOLN
INTRAVENOUS | Status: DC
Start: 2013-11-28 — End: 2013-11-30
  Administered 2013-11-28: 21:00:00 via INTRAVENOUS

## 2013-11-28 MED ORDER — OXYCODONE HCL 5 MG/5ML PO SOLN: 5.0000 mg | Freq: Once | ORAL | Status: DC | PRN

## 2013-11-28 MED ORDER — LACTATED RINGERS IV SOLN
INTRAVENOUS | Status: DC | PRN
  Administered 2013-11-28 (×4): via INTRAVENOUS

## 2013-11-28 MED ORDER — METHOCARBAMOL 500 MG PO TABS
500.0000 mg | ORAL_TABLET | Freq: Four times a day (QID) | ORAL | Status: DC | PRN
  Administered 2013-11-28 – 2013-11-30 (×3): 500 mg via ORAL
  Filled 2013-11-28 (×3): qty 1

## 2013-11-28 MED ORDER — ACETAMINOPHEN 650 MG RE SUPP: 650.0000 mg | Freq: Four times a day (QID) | RECTAL | Status: DC | PRN

## 2013-11-28 MED ORDER — HYDROCHLOROTHIAZIDE 25 MG PO TABS
25.0000 mg | ORAL_TABLET | Freq: Every day | ORAL | Status: DC | PRN
  Filled 2013-11-28: qty 1

## 2013-11-28 MED ORDER — ALUM & MAG HYDROXIDE-SIMETH 200-200-20 MG/5ML PO SUSP: 30.0000 mL | ORAL | Status: DC | PRN

## 2013-11-28 MED ORDER — ONDANSETRON HCL 4 MG/2ML IJ SOLN: 4.0000 mg | Freq: Once | INTRAMUSCULAR | Status: DC | PRN

## 2013-11-28 MED ORDER — HYDROMORPHONE HCL PF 1 MG/ML IJ SOLN
0.2500 mg | INTRAMUSCULAR | Status: DC | PRN
  Administered 2013-11-28 (×4): 0.5 mg via INTRAVENOUS

## 2013-11-28 MED ORDER — HYDROMORPHONE HCL PF 1 MG/ML IJ SOLN
INTRAMUSCULAR | Status: AC
  Administered 2013-11-28: 0.5 mg via INTRAVENOUS
  Filled 2013-11-28: qty 1

## 2013-11-28 MED ORDER — GLYCOPYRROLATE 0.2 MG/ML IJ SOLN
INTRAMUSCULAR | Status: DC | PRN
  Administered 2013-11-28: .4 mg via INTRAVENOUS

## 2013-11-28 MED ORDER — PROPOFOL 10 MG/ML IV BOLUS
INTRAVENOUS | Status: DC | PRN
  Administered 2013-11-28: 120 mg via INTRAVENOUS

## 2013-11-28 MED ORDER — OXYCODONE-ACETAMINOPHEN 5-325 MG PO TABS
1.0000 | ORAL_TABLET | ORAL | Status: DC | PRN
  Administered 2013-11-28 – 2013-11-29 (×2): 1 via ORAL
  Administered 2013-11-29 – 2013-11-30 (×3): 2 via ORAL
  Filled 2013-11-28 (×2): qty 2
  Filled 2013-11-28: qty 1
  Filled 2013-11-28 (×2): qty 2

## 2013-11-28 MED ORDER — PROMETHAZINE HCL 25 MG/ML IJ SOLN: 12.5000 mg | Freq: Four times a day (QID) | INTRAMUSCULAR | Status: DC | PRN

## 2013-11-28 MED ORDER — HYDROMORPHONE HCL PF 1 MG/ML IJ SOLN: 1.0000 mg | INTRAMUSCULAR | Status: DC | PRN

## 2013-11-28 MED ORDER — NEOSTIGMINE METHYLSULFATE 1 MG/ML IJ SOLN
INTRAMUSCULAR | Status: DC | PRN
  Administered 2013-11-28: 3 mg via INTRAVENOUS

## 2013-11-28 MED ORDER — ONDANSETRON HCL 4 MG/2ML IJ SOLN
INTRAMUSCULAR | Status: DC | PRN
  Administered 2013-11-28: 4 mg via INTRAVENOUS

## 2013-11-28 MED ORDER — FENTANYL CITRATE 0.05 MG/ML IJ SOLN
INTRAMUSCULAR | Status: AC
  Filled 2013-11-28: qty 5

## 2013-11-28 MED ORDER — ARTIFICIAL TEARS OP OINT
TOPICAL_OINTMENT | OPHTHALMIC | Status: DC | PRN
  Administered 2013-11-28: 1 via OPHTHALMIC

## 2013-11-28 MED ORDER — METHOCARBAMOL 750 MG PO TABS: 750.0000 mg | ORAL_TABLET | Freq: Three times a day (TID) | ORAL | Status: DC | PRN

## 2013-11-28 MED ORDER — ACETAMINOPHEN 10 MG/ML IV SOLN
INTRAVENOUS | Status: AC
  Administered 2013-11-28: 1000 mg via INTRAVENOUS
  Filled 2013-11-28: qty 100

## 2013-11-28 MED ORDER — ALBUMIN HUMAN 5 % IV SOLN
INTRAVENOUS | Status: DC | PRN
  Administered 2013-11-28 (×3): via INTRAVENOUS

## 2013-11-28 MED ORDER — SODIUM CHLORIDE 0.9 % IV SOLN
INTRAVENOUS | Status: DC | PRN
  Administered 2013-11-28: 16:00:00 via INTRAVENOUS

## 2013-11-28 MED ORDER — LACTATED RINGERS IV SOLN
INTRAVENOUS | Status: DC
  Administered 2013-11-28: 11:00:00 via INTRAVENOUS

## 2013-11-28 MED ORDER — CEFAZOLIN SODIUM-DEXTROSE 2-3 GM-% IV SOLR
INTRAVENOUS | Status: AC
  Administered 2013-11-28: 2 g via INTRAVENOUS
  Filled 2013-11-28: qty 50

## 2013-11-28 MED ORDER — BISACODYL 5 MG PO TBEC: 5.0000 mg | DELAYED_RELEASE_TABLET | Freq: Every day | ORAL | Status: DC | PRN

## 2013-11-28 MED ORDER — MIDAZOLAM HCL 5 MG/5ML IJ SOLN
INTRAMUSCULAR | Status: DC | PRN
  Administered 2013-11-28: 2 mg via INTRAVENOUS

## 2013-11-28 MED ORDER — OXYCODONE HCL 5 MG PO TABS: 5.0000 mg | ORAL_TABLET | Freq: Once | ORAL | Status: DC | PRN

## 2013-11-28 MED ORDER — ACETAMINOPHEN 10 MG/ML IV SOLN
1000.0000 mg | Freq: Once | INTRAVENOUS | Status: AC
  Administered 2013-11-28: 1000 mg via INTRAVENOUS

## 2013-11-28 MED ORDER — MIDAZOLAM HCL 2 MG/2ML IJ SOLN
INTRAMUSCULAR | Status: AC
  Filled 2013-11-28: qty 2

## 2013-11-28 MED ORDER — ONDANSETRON HCL 4 MG PO TABS: 4.0000 mg | ORAL_TABLET | Freq: Four times a day (QID) | ORAL | Status: DC | PRN

## 2013-11-28 MED ORDER — ASPIRIN EC 325 MG PO TBEC
325.0000 mg | DELAYED_RELEASE_TABLET | Freq: Two times a day (BID) | ORAL | Status: DC
  Administered 2013-11-29 – 2013-11-30 (×3): 325 mg via ORAL
  Filled 2013-11-28 (×5): qty 1

## 2013-11-28 MED ORDER — BUPIVACAINE HCL (PF) 0.5 % IJ SOLN
INTRAMUSCULAR | Status: DC | PRN
  Administered 2013-11-28: 20 mL

## 2013-11-28 MED ORDER — ACETAMINOPHEN 325 MG PO TABS: 650.0000 mg | ORAL_TABLET | Freq: Four times a day (QID) | ORAL | Status: DC | PRN

## 2013-11-28 SURGICAL SUPPLY — 51 items
BANDAGE GAUZE ELAST BULKY 4 IN (GAUZE/BANDAGES/DRESSINGS) IMPLANT
BENZOIN TINCTURE PRP APPL 2/3 (GAUZE/BANDAGES/DRESSINGS) ×2 IMPLANT
BLADE SAW SGTL 18X1.27X75 (BLADE) ×2 IMPLANT
BLADE SURG ROTATE 9660 (MISCELLANEOUS) IMPLANT
BNDG COHESIVE 6X5 TAN STRL LF (GAUZE/BANDAGES/DRESSINGS) IMPLANT
CAPT HIP PF COP ×2 IMPLANT
CELLS DAT CNTRL 66122 CELL SVR (MISCELLANEOUS) ×1 IMPLANT
CLSR STERI-STRIP ANTIMIC 1/2X4 (GAUZE/BANDAGES/DRESSINGS) IMPLANT
COVER SURGICAL LIGHT HANDLE (MISCELLANEOUS) ×2 IMPLANT
DRAPE C-ARM 42X72 X-RAY (DRAPES) ×2 IMPLANT
DRAPE STERI IOBAN 125X83 (DRAPES) ×2 IMPLANT
DRAPE U-SHAPE 47X51 STRL (DRAPES) ×6 IMPLANT
DRSG AQUACEL AG ADV 3.5X10 (GAUZE/BANDAGES/DRESSINGS) ×2 IMPLANT
DRSG MEPILEX BORDER 4X8 (GAUZE/BANDAGES/DRESSINGS) ×2 IMPLANT
DURAPREP 26ML APPLICATOR (WOUND CARE) ×2 IMPLANT
ELECT BLADE 4.0 EZ CLEAN MEGAD (MISCELLANEOUS)
ELECT CAUTERY BLADE 6.4 (BLADE) ×2 IMPLANT
ELECT REM PT RETURN 9FT ADLT (ELECTROSURGICAL) ×2
ELECTRODE BLDE 4.0 EZ CLN MEGD (MISCELLANEOUS) IMPLANT
ELECTRODE REM PT RTRN 9FT ADLT (ELECTROSURGICAL) ×1 IMPLANT
GAUZE XEROFORM 1X8 LF (GAUZE/BANDAGES/DRESSINGS) ×2 IMPLANT
GLOVE BIOGEL PI IND STRL 8 (GLOVE) ×2 IMPLANT
GLOVE BIOGEL PI INDICATOR 8 (GLOVE) ×2
GLOVE ECLIPSE 7.5 STRL STRAW (GLOVE) ×4 IMPLANT
GOWN STRL REUS W/ TWL LRG LVL3 (GOWN DISPOSABLE) ×2 IMPLANT
GOWN STRL REUS W/ TWL XL LVL3 (GOWN DISPOSABLE) ×2 IMPLANT
GOWN STRL REUS W/TWL LRG LVL3 (GOWN DISPOSABLE) ×2
GOWN STRL REUS W/TWL XL LVL3 (GOWN DISPOSABLE) ×2
GUIDEWIRE BEAD TIP (WIRE) ×2 IMPLANT
HOOD PEEL AWAY FACE SHEILD DIS (HOOD) ×4 IMPLANT
KIT BASIN OR (CUSTOM PROCEDURE TRAY) ×2 IMPLANT
KIT ROOM TURNOVER OR (KITS) ×2 IMPLANT
MANIFOLD NEPTUNE II (INSTRUMENTS) ×2 IMPLANT
NS IRRIG 1000ML POUR BTL (IV SOLUTION) ×2 IMPLANT
PACK TOTAL JOINT (CUSTOM PROCEDURE TRAY) ×2 IMPLANT
PAD ARMBOARD 7.5X6 YLW CONV (MISCELLANEOUS) ×4 IMPLANT
RTRCTR WOUND ALEXIS 18CM MED (MISCELLANEOUS) ×2
SPONGE LAP 18X18 X RAY DECT (DISPOSABLE) IMPLANT
STAPLER VISISTAT 35W (STAPLE) IMPLANT
SUCTION FRAZIER TIP 10 FR DISP (SUCTIONS) ×2 IMPLANT
SUT MNCRL AB 3-0 PS2 18 (SUTURE) IMPLANT
SUT VIC AB 0 CT1 27 (SUTURE) ×1
SUT VIC AB 0 CT1 27XBRD ANBCTR (SUTURE) ×1 IMPLANT
SUT VIC AB 1 CT1 27 (SUTURE) ×4
SUT VIC AB 1 CT1 27XBRD ANBCTR (SUTURE) ×4 IMPLANT
SUT VIC AB 2-0 CT1 27 (SUTURE) ×1
SUT VIC AB 2-0 CT1 TAPERPNT 27 (SUTURE) ×1 IMPLANT
TOWEL OR 17X24 6PK STRL BLUE (TOWEL DISPOSABLE) ×2 IMPLANT
TOWEL OR 17X26 10 PK STRL BLUE (TOWEL DISPOSABLE) ×2 IMPLANT
TRAY FOLEY CATH 16FR SILVER (SET/KITS/TRAYS/PACK) IMPLANT
WATER STERILE IRR 1000ML POUR (IV SOLUTION) ×4 IMPLANT

## 2013-11-28 NOTE — Discharge Instructions (Signed)
Total Hip Replacement °Care After °Refer to this sheet in the next few weeks. These instructions provide you with information on caring for yourself after your procedure. Your caregiver also may give you specific instructions. Your treatment has been planned according to the most current medical practices, but problems sometimes occur. Call your caregiver if you have any problems or questions after your procedure. °HOME CARE INSTRUCTIONS  °Your caregiver will give you specific precautions for certain types of movement. Additional instructions include: °· Take over-the-counter or prescription medicines for pain, discomfort, or fever only as directed by your caregiver. °· Take quick showers (3 5 min) rather than bathe until your caregiver tells you that you can take baths again. °· Avoid lifting until your caregiver instructs you otherwise. °· Use a raised toilet seat and avoid sitting in low chairs as instructed by your caregiver. °· Use crutches or a walker as instructed by your caregiver. °SEEK MEDICAL CARE IF: °· You have difficulty breathing. °· Your wound is red, swollen, or has become increasingly painful. °· You have pus draining from your wound. °· You have a bad smell coming from your wound. °· You have persistent bleeding from your wound. °· Your wound breaks open after sutures (stitches) or staples have been removed. °SEEK IMMEDIATE MEDICAL CARE IF:  °· You have a fever. °· You have a rash. °· You have pain or swelling in your calf or thigh. °· You have shortness of breath or chest pain. °MAKE SURE YOU: °· Understand these instructions. °· Will watch your condition. °· Will get help right away if you are not doing well or get worse. °Document Released: 02/14/2005 Document Revised: 01/27/2012 Document Reviewed: 09/14/2011 °ExitCare® Patient Information ©2014 [employer]. ° °

## 2013-11-28 NOTE — Anesthesia Postprocedure Evaluation (Signed)
  Anesthesia Post-op Note  Patient: Victoria Morgan  Procedure(s) Performed: Procedure(s) with comments: TOTAL HIP ARTHROPLASTY ANTERIOR APPROACH (Right) - Right total hip arthroplasty anterior approach  Patient Location: PACU  Anesthesia Type:General  Level of Consciousness: awake and alert   Airway and Oxygen Therapy: Patient Spontanous Breathing and Patient connected to nasal cannula oxygen  Post-op Pain: mild  Post-op Assessment: Post-op Vital signs reviewed, Patient's Cardiovascular Status Stable, Respiratory Function Stable, Patent Airway, No signs of Nausea or vomiting and Pain level controlled  Post-op Vital Signs: Reviewed and stable  Last Vitals:  Filed Vitals:   11/28/13 1842  BP: 146/80  Pulse: 52  Temp:   Resp: 14    Complications: No apparent anesthesia complications

## 2013-11-28 NOTE — Anesthesia Procedure Notes (Signed)
Procedure Name: Intubation Date/Time: 11/28/2013 1:06 PM Performed by: Tyrone NineSAUVE, Kenard Morawski F Pre-anesthesia Checklist: Patient identified, Timeout performed, Emergency Drugs available, Suction available and Patient being monitored Patient Re-evaluated:Patient Re-evaluated prior to inductionOxygen Delivery Method: Circle system utilized Preoxygenation: Pre-oxygenation with 100% oxygen Intubation Type: IV induction Ventilation: Mask ventilation without difficulty Laryngoscope Size: Mac and 3 Grade View: Grade II Tube type: Oral Tube size: 7.5 mm Number of attempts: 1 Airway Equipment and Method: Stylet Placement Confirmation: ETT inserted through vocal cords under direct vision,  breath sounds checked- equal and bilateral and positive ETCO2 Secured at: 23 cm Tube secured with: Tape Dental Injury: Teeth and Oropharynx as per pre-operative assessment

## 2013-11-28 NOTE — Transfer of Care (Signed)
Immediate Anesthesia Transfer of Care Note  Patient: Victoria Morgan  Procedure(s) Performed: Procedure(s) with comments: TOTAL HIP ARTHROPLASTY ANTERIOR APPROACH (Right) - Right total hip arthroplasty anterior approach  Patient Location: PACU  Anesthesia Type:General  Level of Consciousness: awake, alert , oriented and patient cooperative  Airway & Oxygen Therapy: Patient Spontanous Breathing and Patient connected to nasal cannula oxygen  Post-op Assessment: Report given to PACU RN and Post -op Vital signs reviewed and stable  Post vital signs: Reviewed and stable  Complications: No apparent anesthesia complications

## 2013-11-28 NOTE — Anesthesia Preprocedure Evaluation (Addendum)
Anesthesia Evaluation  Patient identified by MRN, date of birth, ID band Patient awake    Reviewed: Allergy & Precautions, H&P , NPO status , Patient's Chart, lab work & pertinent test results  Airway Mallampati: II TM Distance: >3 FB Neck ROM: Full    Dental  (+) Teeth Intact, Dental Advisory Given   Pulmonary asthma ,  11-18-13 Chest x-ray IMPRESSION: No acute cardiopulmonary disease. breath sounds clear to auscultation  Pulmonary exam normal       Cardiovascular Exercise Tolerance: Good Rhythm:Regular Rate:Normal  18-Nov-2013  Normal sinus rhythm Normal ECG No old tracing to compare   Neuro/Psych negative neurological ROS     GI/Hepatic negative GI ROS, Neg liver ROS,   Endo/Other    Renal/GU negative Renal ROS     Musculoskeletal  (+) Arthritis -, Osteoarthritis,    Abdominal Normal abdominal exam  (+) + obese,   Peds  Hematology negative hematology ROS (+) Hx of Spleenectomy   11/18/2013 16:03 WBC: 5.5 RBC: 3.84 (L) Hemoglobin: 11.7 (L) HCT: 32.9 (L) MCV: 85.7 MCH: 30.5 MCHC: 35.6 RDW: 14.1 Platelets: 84 (L)        Anesthesia Other Findings   Reproductive/Obstetrics negative OB ROS                      Anesthesia Physical Anesthesia Plan  ASA: III  Anesthesia Plan: General   Post-op Pain Management:    Induction: Intravenous  Airway Management Planned: Oral ETT  Additional Equipment:   Intra-op Plan:   Post-operative Plan: Extubation in OR  Informed Consent: I have reviewed the patients History and Physical, chart, labs and discussed the procedure including the risks, benefits and alternatives for the proposed anesthesia with the patient or authorized representative who has indicated his/her understanding and acceptance.   Dental advisory given  Plan Discussed with: CRNA and Anesthesiologist  Anesthesia Plan Comments: (DJD R. Hip ITP chronic Platelet count  84,000  Plan GA with oral ETT  Kipp Broodavid Joslin, MD)       Anesthesia Quick Evaluation

## 2013-11-28 NOTE — Brief Op Note (Signed)
11/28/2013  4:16 PM  PATIENT:  Victoria Morgan  58 y.o. female  PRE-OPERATIVE DIAGNOSIS:  Severe degenerative joint disease right hip  POST-OPERATIVE DIAGNOSIS:  Severe degenerative joint disease right hip  PROCEDURE:  Procedure(s) with comments: TOTAL HIP ARTHROPLASTY ANTERIOR APPROACH (Right) - Right total hip arthroplasty anterior approach  SURGEON:  Surgeon(s) and Role:    * Harvie JuniorJohn L Skylinn Vialpando, MD - Primary  PHYSICIAN ASSISTANT:   ASSISTANTS: bethune   ANESTHESIA:   general  EBL:  Total I/O In: 3750 [I.V.:3000; IV Piggyback:750] Out: 3300 [Urine:1400; Blood:1900]  BLOOD ADMINISTERED:none  DRAINS: none   LOCAL MEDICATIONS USED:  MARCAINE     SPECIMEN:  No Specimen  DISPOSITION OF SPECIMEN:  N/A  COUNTS:  YES  TOURNIQUET:  * No tourniquets in log *  DICTATION: .Other Dictation: Dictation Number [id number]  PLAN OF CARE: Admit to inpatient   PATIENT DISPOSITION:  PACU - hemodynamically stable.   Delay start of Pharmacological VTE agent (>24hrs) due to surgical blood loss or risk of bleeding: no

## 2013-11-28 NOTE — H&P (Signed)
TOTAL HIP ADMISSION H&P  Patient is admitted for right total hip arthroplasty.  Subjective:  Chief Complaint: right hip pain  HPI: Victoria Morgan, 58 y.o. female, has a history of pain and functional disability in the right hip(s) due to arthritis and patient has failed non-surgical conservative treatments for greater than 12 weeks to include NSAID's and/or analgesics, weight reduction as appropriate and activity modification.  Onset of symptoms was gradual starting 8 years ago with gradually worsening course since that time.The patient noted no past surgery on the right hip(s).  Patient currently rates pain in the right hip at 9 out of 10 with activity. Patient has night pain, worsening of pain with activity and weight bearing, trendelenberg gait, pain that interfers with activities of daily living, pain with passive range of motion, crepitus and joint swelling. Patient has evidence of subchondral cysts, subchondral sclerosis, joint subluxation and joint space narrowing by imaging studies. This condition presents safety issues increasing the risk of falls. This patient has had failure of conservative care.  There is no current active infection.  There are no active problems to display for this patient.  Past Medical History  Diagnosis Date  . Asthma   . Blood dyscrasia     ITP   dx in 1990    Past Surgical History  Procedure Laterality Date  . Spleenectomy      x 2  . Tubal ligation    . Ctr      right hand    Prescriptions prior to admission  Medication Sig Dispense Refill  . acetaminophen (TYLENOL ARTHRITIS PAIN) 650 MG CR tablet Take 650 mg by mouth 2 (two) times daily as needed for pain.      . Calcium Carbonate-Vitamin D (CALCIUM + D PO) Take 1 tablet by mouth daily.      Marland Kitchen GARCINIA CAMBOGIA-CHROMIUM PO Take 1 tablet by mouth daily.      . hydrochlorothiazide (HYDRODIURIL) 25 MG tablet Take 25 mg by mouth daily as needed (for fluid).      . Melatonin 10 MG CAPS  Take 10 mg by mouth at bedtime.      . meloxicam (MOBIC) 15 MG tablet Take 7.5 mg by mouth 2 (two) times daily.      . Multiple Vitamin (MULTIVITAMIN WITH MINERALS) TABS tablet Take 1 tablet by mouth daily.      . Naphazoline HCl (CLEAR EYES OP) Place 1 drop into both eyes daily.      . traMADol (ULTRAM) 50 MG tablet Take 50 mg by mouth 2 (two) times daily.      . cetirizine (ZYRTEC) 10 MG tablet Take 10 mg by mouth daily.       Allergies  Allergen Reactions  . Aspirin     History  Substance Use Topics  . Smoking status: Never Smoker   . Smokeless tobacco: Not on file  . Alcohol Use: Yes     Comment: occasionally    History reviewed. No pertinent family history.   ROS ROS: I have reviewed the patient's review of systems thoroughly and there are no positive responses as relates to the HPI. Objective:  Physical Exam  Vital signs in last 24 hours: Temp:  [97.9 F (36.6 C)] 97.9 F (36.6 C) (04/20 1025) Pulse Rate:  [58] 58 (04/20 1025) Resp:  [20] 20 (04/20 1025) BP: (146)/(67) 146/67 mmHg (04/20 1025) SpO2:  [100 %] 100 % (04/20 1025) Weight:  [219 lb (99.338 kg)] 219 lb (99.338 kg) (04/20 1025) Well-developed  well-nourished patient in no acute distress. Alert and oriented x3 HEENT:within normal limits Cardiac: Regular rate and rhythm Pulmonary: Lungs clear to auscultation Abdomen: Soft and nontender.  Normal active bowel sounds  Musculoskeletal: r hip: limited rom and pain a;ll rom Labs: Recent Results (from the past 2160 hour(s))  URINALYSIS, ROUTINE W REFLEX MICROSCOPIC     Status: Abnormal   Collection Time    11/18/13  3:58 PM      Result Value Ref Range   Color, Urine YELLOW  YELLOW   APPearance HAZY (*) CLEAR   Specific Gravity, Urine 1.016  1.005 - 1.030   pH 6.0  5.0 - 8.0   Glucose, UA NEGATIVE  NEGATIVE mg/dL   Hgb urine dipstick NEGATIVE  NEGATIVE   Bilirubin Urine NEGATIVE  NEGATIVE   Ketones, ur NEGATIVE  NEGATIVE mg/dL   Protein, ur NEGATIVE   NEGATIVE mg/dL   Urobilinogen, UA 0.2  0.0 - 1.0 mg/dL   Nitrite NEGATIVE  NEGATIVE   Leukocytes, UA MODERATE (*) NEGATIVE  URINE MICROSCOPIC-ADD ON     Status: None   Collection Time    11/18/13  3:58 PM      Result Value Ref Range   Squamous Epithelial / LPF RARE  RARE   WBC, UA 3-6  <3 WBC/hpf   Bacteria, UA RARE  RARE  APTT     Status: None   Collection Time    11/18/13  4:03 PM      Result Value Ref Range   aPTT 27  24 - 37 seconds  CBC WITH DIFFERENTIAL     Status: Abnormal   Collection Time    11/18/13  4:03 PM      Result Value Ref Range   WBC 5.5  4.0 - 10.5 K/uL   RBC 3.84 (*) 3.87 - 5.11 MIL/uL   Hemoglobin 11.7 (*) 12.0 - 15.0 g/dL   HCT 32.9 (*) 36.0 - 46.0 %   MCV 85.7  78.0 - 100.0 fL   MCH 30.5  26.0 - 34.0 pg   MCHC 35.6  30.0 - 36.0 g/dL   RDW 14.1  11.5 - 15.5 %   Platelets 84 (*) 150 - 400 K/uL   Comment: SPECIMEN CHECKED FOR CLOTS     REPEATED TO VERIFY     PLATELET COUNT CONFIRMED BY SMEAR     CORRECTED ON 04/10 AT 1717: PREVIOUSLY REPORTED AS 84   Neutrophils Relative % 48  43 - 77 %   Lymphocytes Relative 37  12 - 46 %   Monocytes Relative 10  3 - 12 %   Eosinophils Relative 4  0 - 5 %   Basophils Relative 1  0 - 1 %   Neutro Abs 2.6  1.7 - 7.7 K/uL   Lymphs Abs 2.0  0.7 - 4.0 K/uL   Monocytes Absolute 0.6  0.1 - 1.0 K/uL   Eosinophils Absolute 0.2  0.0 - 0.7 K/uL   Basophils Absolute 0.1  0.0 - 0.1 K/uL  COMPREHENSIVE METABOLIC PANEL     Status: Abnormal   Collection Time    11/18/13  4:03 PM      Result Value Ref Range   Sodium 142  137 - 147 mEq/L   Potassium 4.0  3.7 - 5.3 mEq/L   Chloride 104  96 - 112 mEq/L   CO2 25  19 - 32 mEq/L   Glucose, Bld 83  70 - 99 mg/dL   BUN 18  6 - 23 mg/dL  Creatinine, Ser 1.03  0.50 - 1.10 mg/dL   Calcium 10.0  8.4 - 10.5 mg/dL   Total Protein 6.9  6.0 - 8.3 g/dL   Albumin 3.7  3.5 - 5.2 g/dL   AST 25  0 - 37 U/L   ALT 27  0 - 35 U/L   Alkaline Phosphatase 80  39 - 117 U/L   Total Bilirubin 0.4   0.3 - 1.2 mg/dL   GFR calc non Af Amer 60 (*) >90 mL/min   GFR calc Af Amer 70 (*) >90 mL/min   Comment: (NOTE)     The eGFR has been calculated using the CKD EPI equation.     This calculation has not been validated in all clinical situations.     eGFR's persistently <90 mL/min signify possible Chronic Kidney     Disease.  PROTIME-INR     Status: None   Collection Time    11/18/13  4:03 PM      Result Value Ref Range   Prothrombin Time 12.9  11.6 - 15.2 seconds   INR 0.99  0.00 - 1.49  SURGICAL PCR SCREEN     Status: None   Collection Time    11/18/13  4:05 PM      Result Value Ref Range   MRSA, PCR NEGATIVE  NEGATIVE   Staphylococcus aureus NEGATIVE  NEGATIVE   Comment:            The Xpert SA Assay (FDA     approved for NASAL specimens     in patients over 72 years of age),     is one component of     a comprehensive surveillance     program.  Test performance has     been validated by [employer] for patients greater     than or equal to 78 year old.     It is not intended     to diagnose infection nor to     guide or monitor treatment.  TYPE AND SCREEN     Status: None   Collection Time    11/18/13  4:12 PM      Result Value Ref Range   ABO/RH(D) O POS     Antibody Screen NEG     Sample Expiration 12/02/2013    ABO/RH     Status: None   Collection Time    11/18/13  4:12 PM      Result Value Ref Range   ABO/RH(D) O POS      Estimated body mass index is 36.44 kg/(m^2) as calculated from the following:   Height as of 11/18/13: _0  (1.651 m).   Weight as of this encounter: 219 lb (99.338 kg).   Imaging Review Plain radiographs demonstrate severe degenerative joint disease of the right hip(s). The bone quality appears to be good for age and reported activity level.  Assessment/Plan:  End stage arthritis, right hip(s)  The patient history, physical examination, clinical judgement of the provider and imaging studies are consistent with end stage  degenerative joint disease of the right hip(s) and total hip arthroplasty is deemed medically necessary. The treatment options including medical management, injection therapy, arthroscopy and arthroplasty were discussed at length. The risks and benefits of total hip arthroplasty were presented and reviewed. The risks due to aseptic loosening, infection, stiffness, dislocation/subluxation,  thromboembolic complications and other imponderables were discussed.  The patient acknowledged the explanation, agreed to proceed with the plan and consent  was signed. Patient is being admitted for inpatient treatment for surgery, pain control, PT, OT, prophylactic antibiotics, VTE prophylaxis, progressive ambulation and ADL's and discharge planning.The patient is planning to be discharged home with home health services

## 2013-11-29 ENCOUNTER — Encounter (HOSPITAL_COMMUNITY): Payer: Self-pay | Admitting: Orthopedic Surgery

## 2013-11-29 LAB — CBC
HCT: 29.2 % — ABNORMAL LOW (ref 36.0–46.0)
Hemoglobin: 10.4 g/dL — ABNORMAL LOW (ref 12.0–15.0)
MCH: 30.4 pg (ref 26.0–34.0)
MCHC: 35.6 g/dL (ref 30.0–36.0)
MCV: 85.4 fL (ref 78.0–100.0)
Platelets: 77 10*3/uL — ABNORMAL LOW (ref 150–400)
RBC: 3.42 MIL/uL — AB (ref 3.87–5.11)
RDW: 13.9 % (ref 11.5–15.5)
WBC: 13.6 10*3/uL — AB (ref 4.0–10.5)

## 2013-11-29 LAB — BASIC METABOLIC PANEL
BUN: 10 mg/dL (ref 6–23)
CHLORIDE: 106 meq/L (ref 96–112)
CO2: 23 meq/L (ref 19–32)
CREATININE: 0.79 mg/dL (ref 0.50–1.10)
Calcium: 9.4 mg/dL (ref 8.4–10.5)
GFR calc Af Amer: >90 mL/min (ref >90)
GFR calc non Af Amer: >90 mL/min (ref >90)
Glucose, Bld: 116 mg/dL — ABNORMAL HIGH (ref 70–99)
Potassium: 4.2 mEq/L (ref 3.7–5.3)
SODIUM: 141 meq/L (ref 137–147)

## 2013-11-29 LAB — PROTIME-INR
INR: 1.15 (ref 0.00–1.49)
Prothrombin Time: 14.5 seconds (ref 11.6–15.2)

## 2013-11-29 NOTE — Progress Notes (Signed)
Patient came up form PACU with 2nd unit of prbc infusing at 1930. Second unit of blood ended at 2050. Patient tolerated transfusion well. Unable to document stop time on charting due to previous documentation. VS post transfusion 97.8, 123/73, 56, 18. Will continue to monitor.

## 2013-11-29 NOTE — Care Management Note (Signed)
CARE MANAGEMENT NOTE 11/29/2013  Patient:  JONES-DEGREAFFENREIDT,Mirabel   Account Number:  [id number]  Date Initiated:  11/29/2013  Documentation initiated by:  Vance PeperBRADY,Karol Liendo  Subjective/Objective Assessment:   [age] yr old female s/p right anterior hip replacement.     Action/Plan:   Case manager spoke with patient concerning home health and DME needs at discharge. Patient preoperatively setup with [facility], no changes. Patient states she has rolling walker, will need tub transfer bench and 3in1.   Anticipated DC Date:  11/30/2013   Anticipated DC Plan:  HOME W HOME HEALTH SERVICES      DC Planning Services  CM consult      PAC Choice  DURABLE MEDICAL EQUIPMENT  HOME HEALTH   Choice offered to / List presented to:  C-1 Patient   DME arranged  3-N-1  TUB BENCH      DME agency  [employer]     HH arranged  HH-2 PT      HH agency  [facility]   Status of service:  Completed, signed off Medicare Important Message given?   (If response is "NO", the following Medicare IM given date fields will be blank) Date Medicare IM given:   Date Additional Medicare IM given:    Discharge Disposition:  HOME W HOME HEALTH SERVICES  Per UR Regulation:

## 2013-11-29 NOTE — Evaluation (Signed)
Physical Therapy Evaluation Patient Details Name: Victoria PillarShelia Morgan MRN: [id number] DOB: 02/23/1956 Today's Date: 11/29/2013   History of Present Illness  Pt is s/p R THA - Direct anterior approach  Clinical Impression  Patient is s/p right total hip arthroplasty resulting in functional limitations due to the deficits listed below (see PT Problem List). Pt tolerate treatment well and ambulated up to 125 feet with min guard for safety. Gait training, education, and therapeutic exercises provided. Patient will benefit from skilled PT to increase their independence and safety with mobility to allow discharge to the venue listed below.       Follow Up Recommendations Home health PT;Supervision for mobility/OOB    Equipment Recommendations  3in1 (PT)    Recommendations for Other Services OT consult     Precautions / Restrictions Precautions Precautions: None Precaution Comments: Direct anterior approach - no precautions Restrictions Weight Bearing Restrictions: Yes RLE Weight Bearing: Weight bearing as tolerated      Mobility  Bed Mobility               General bed mobility comments:  (Pt up in chair upon OT arrival)  Transfers Overall transfer level: Needs assistance Equipment used: Rolling walker (2 wheeled);1 person hand held assist Transfers: Sit to/from UGI CorporationStand;Stand Pivot Transfers Sit to Stand: Min guard Stand pivot transfers: Min guard       General transfer comment: VC's for safety and sequencing w/ RW as well as hand placement during transfers.  Ambulation/Gait Ambulation/Gait assistance: Min guard Ambulation Distance (Feet): 125 Feet Assistive device: Rolling walker (2 wheeled) Gait Pattern/deviations: Step-to pattern;Step-through pattern;Decreased step length - left;Decreased stance time - right;Decreased weight shift to right;Antalgic     General Gait Details: Pt ambulating generally well. Verbal cues for sequencing of gait and RW, pt able to  demonstrate quickly. PT focused on increasing left step length for step-through gait pattern.   Stairs            Wheelchair Mobility    Modified Rankin (Stroke Patients Only)       Balance Overall balance assessment: No apparent balance deficits (not formally assessed) Sitting-balance support: No upper extremity supported;Feet supported Sitting balance-Leahy Scale: Good Sitting balance - Comments: sits edge of recliner; good limits of stability   Standing balance support: No upper extremity supported Standing balance-Leahy Scale: Fair Standing balance comment: Able to stand without RW momentarily                              Pertinent Vitals/Pain 2/10 pain Pt repositioned in chair for comfort    Home Living Family/patient expects to be discharged to:: Private residence Living Arrangements: Spouse/significant other Available Help at Discharge: Family;Available 24 hours/day Type of Home: House Home Access: Stairs to enter Entrance Stairs-Rails: Right;Left Entrance Stairs-Number of Steps: 3 STE or  6 STE from garage. Home Layout: One level Home Equipment: Cane - single point;Walker - 2 wheels      Prior Function Level of Independence: Independent with assistive device(s)   Gait / Transfers Assistance Needed: Used SPC for mobility at home and work  ADL's / [employer] CorporationHomemaking Assistance Needed: Ocassional A for donning/doffing shoes/socks        Hand Dominance   Dominant Hand: Right    Extremity/Trunk Assessment   Upper Extremity Assessment: Overall WFL for tasks assessed           Lower Extremity Assessment: Defer to PT evaluation RLE Deficits / Details: Decreased strength/rom  of R hip    Cervical / Trunk Assessment: Normal  Communication   Communication: No difficulties  Cognition Arousal/Alertness: Awake/alert Behavior During Therapy: WFL for tasks assessed/performed Overall Cognitive Status: Within Functional Limits for tasks assessed                       General Comments General comments (skin integrity, edema, etc.): Pt educated on safe mobility techniques and exercises. Answered questions concerning d/c home and support she may need.    Exercises Total Joint Exercises Ankle Circles/Pumps: AROM;Both;10 reps;Seated Gluteal Sets: AROM;Both;10 reps;Seated Straight Leg Raises: AAROM;Right;10 reps;Seated Long Arc Quad: AROM;Right;10 reps;Seated      Assessment/Plan    PT Assessment Patient needs continued PT services  PT Diagnosis Difficulty walking;Abnormality of gait;Acute pain   PT Problem List Decreased strength;Decreased range of motion;Decreased activity tolerance;Decreased balance;Decreased mobility;Decreased knowledge of use of DME;Pain  PT Treatment Interventions DME instruction;Gait training;Stair training;Functional mobility training;Therapeutic activities;Therapeutic exercise;Balance training;Neuromuscular re-education;Patient/family education;Modalities   PT Goals (Current goals can be found in the Care Plan section) Acute Rehab PT Goals Patient Stated Goal: Home when able w/ family PRN assist PT Goal Formulation: With patient Time For Goal Achievement: 12/06/13 Potential to Achieve Goals: Good    Frequency 7X/week   Barriers to discharge        Co-evaluation               End of Session Equipment Utilized During Treatment: Gait belt Activity Tolerance: Patient tolerated treatment well Patient left: in chair;with call bell/phone within reach;with family/visitor present Nurse Communication: Mobility status         Time: 1131-1156 PT Time Calculation (min): 25 min   Charges:   PT Evaluation $Initial PT Evaluation Tier I: 1 Procedure PT Treatments $Gait Training: 8-22 mins   PT G CodesSunday Spillers:        Boykin Baetz Secor Bir[place]bour, [place] CarolinaPT [phone]   Berton MountLogan S Cathern Tahir 11/29/2013, 2:43 PM

## 2013-11-29 NOTE — Evaluation (Signed)
Occupational Therapy Evaluation Patient Details Name: Victoria Morgan MRN: [id number] DOB: 09/25/1955 Today's Date: 11/29/2013    History of Present Illness Pt is s/p R THA - Direct anterior approach   Clinical Impression   Pt presents s/p R THA w/ deficits in areas of ADL's and functional mobility/transfers. She should benefit from acute OT to assist in maximizing independence w/ ADL's and functional transfers prior to d/c home w/ family PRN assist. Will perform tub transfers and review a/e use next visit.    Follow Up Recommendations  No OT follow up    Equipment Recommendations  Other (comment);None recommended by OT (Pt reports that she has A/E and DME. Will need 3:1 if she does not have one at home.)    Recommendations for Other Services       Precautions / Restrictions Precautions Precautions: None Precaution Comments: Direct anterior approach - no precautions Restrictions Weight Bearing Restrictions: Yes RLE Weight Bearing: Weight bearing as tolerated      Mobility Bed Mobility               General bed mobility comments:  (Pt up in chair upon OT arrival)  Transfers Overall transfer level: Needs assistance Equipment used: Rolling walker (2 wheeled);1 person hand held assist Transfers: Sit to/from [employer] Sit to Stand: Min guard Stand pivot transfers: Min guard       General transfer comment: VC's for safety and sequencing w/ RW as well as hand placement during transfers.    Balance Overall balance assessment: No apparent balance deficits (not formally assessed)                                          ADL Overall ADL's : Needs assistance/impaired     Grooming: Wash/dry hands;Wash/dry face;Oral care;Set up;Sitting   Upper Body Bathing: Set up;Sitting   Lower Body Bathing: Minimal assistance;Sit to/from stand   Upper Body Dressing : Set up;Sitting   Lower Body Dressing: Min  guard;Supervision/safety;Sit to/from stand;With adaptive equipment Lower Body Dressing Details (indicate cue type and reason): Pt used long handled a/e for don/doffing socks today. She has hip kit at home. Toilet Transfer: Supervision/safety;Ambulation;BSC;RW Toilet Transfer Details (indicate cue type and reason): 3:1 over toilet in bathroom today. Toileting- Clothing Manipulation and Hygiene: Supervision/safety;Sit to/from stand;Cueing for safety;Cueing for sequencing       Functional mobility during ADLs: Supervision/safety;Rolling walker;Cueing for safety;Cueing for sequencing General ADL Comments: Pt is motivated and plans to d/c home w/ family PRN assist. She has a hip kit and should benfit from acute OT to address deficits in LB dressing, tub/toilet transfers and a/e education.     Vision  Wears glasses PRN.                   Perception     Praxis      Pertinent Vitals/Pain Pt denies pain upon assessment, stating "It feels better now than it did before the surgery, less pain." Re: R hip.     Hand Dominance Right   Extremity/Trunk Assessment Upper Extremity Assessment Upper Extremity Assessment: Overall WFL for tasks assessed   Lower Extremity Assessment Lower Extremity Assessment: Defer to PT evaluation   Cervical / Trunk Assessment Cervical / Trunk Assessment: Normal   Communication Communication Communication: No difficulties   Cognition Arousal/Alertness: Awake/alert Behavior During Therapy: WFL for tasks assessed/performed Overall Cognitive Status: Within Functional Limits for  tasks assessed                     General Comments       Exercises       Shoulder Instructions      Home Living Family/patient expects to be discharged to:: Private residence Living Arrangements: Spouse/significant other Available Help at Discharge: Family;Available 24 hours/day Type of Home: House Home Access: Stairs to enter [employer] of Steps: 3  STE or  6 STE from garage. Entrance Stairs-Rails: Right;Left Home Layout: One level     Bathroom Shower/Tub: Tub/shower unit Shower/tub characteristics: Curtain Biochemist, clinical:  (Comfort height toilet)     Home Equipment: Cane - single point;Walker - 2 wheels          Prior Functioning/Environment Level of Independence: Independent with assistive device(s)  Gait / Transfers Assistance Needed: Used SPC for mobility at home and work ADL's / [employer] Assistance Needed: Ocassional A for donning/doffing shoes/socks        OT Diagnosis: Generalized weakness;Acute pain   OT Problem List: Decreased knowledge of use of DME or AE;Decreased activity tolerance   OT Treatment/Interventions: Self-care/ADL training;DME and/or AE instruction;Patient/family education;Therapeutic activities    OT Goals(Current goals can be found in the care plan section) Acute Rehab OT Goals Patient Stated Goal: Home when able w/ family PRN assist OT Goal Formulation: With patient Time For Goal Achievement: 12/06/13 Potential to Achieve Goals: Good  OT Frequency: Min 2X/week   Barriers to D/C:            Co-evaluation              End of Session Equipment Utilized During Treatment: Gait belt;Rolling walker Nurse Communication: Mobility status  Activity Tolerance: Patient tolerated treatment well Patient left: in chair;with call bell/phone within reach;with family/visitor present   Time: 0940-1006 OT Time Calculation (min): 26 min Charges:  OT General Charges $OT Visit: 1 Procedure OT Evaluation $Initial OT Evaluation Tier I: 1 Procedure OT Treatments $Self Care/Home Management : 23-37 mins G-Codes:    Josedaniel Haye B Markian Glockner Dec 11, 2013, 11:47 AM

## 2013-11-29 NOTE — Progress Notes (Signed)
Physical Therapy Treatment Patient Details Name: Victoria PillarShelia Morgan MRN: [id number] DOB: 12/13/1955 Today's Date: 11/29/2013    History of Present Illness Pt is s/p R THA - Direct anterior approach    PT Comments    Pt tolerated treatment well and has completed stair training, safely demonstrating ability to navigate steps for d/c home. Education and exercises reviewed and pt has no further questions at this time concerning mobility. Expect pt to continue to progress well with HHPT focusing on improving independence with functional mobility. Feel that pt is appropriate for d/c when medically able.   Follow Up Recommendations  Home health PT;Supervision for mobility/OOB     Equipment Recommendations  3in1 (PT)    Recommendations for Other Services OT consult     Precautions / Restrictions Precautions Precautions: None Precaution Comments: Direct anterior approach - no precautions Restrictions Weight Bearing Restrictions: Yes RLE Weight Bearing: Weight bearing as tolerated    Mobility  Bed Mobility               General bed mobility comments: Pt in reclining chair when PT entered room  Transfers Overall transfer level: Needs assistance Equipment used: Rolling walker (2 wheeled) Transfers: Sit to/from Stand Sit to Stand: Supervision Stand pivot transfers: Min guard       General transfer comment: Supervision for safety. No cues needed. Pt safely performed with correct technique.  Ambulation/Gait Ambulation/Gait assistance: Supervision Ambulation Distance (Feet): 85 Feet Assistive device: Rolling walker (2 wheeled) Gait Pattern/deviations: Step-through pattern;Decreased step length - left;Decreased stance time - right;Antalgic;Decreased weight shift to right     General Gait Details: Pt continues to ambulate generally well. PT focused on decreasing left lateral list and increase WS to right during right stance phase. Cues for right knee flexion and  right heel strike.    Stairs Stairs: Yes Stairs assistance: Min guard Stair Management: Two rails;Step to pattern;Forwards Number of Stairs: 2 (x2) General stair comments: Pt ascend/descend 2 steps x 2 with min guard. cues for sequencing. pt able to demonstrate safely and reports the technique she was taught today is similar to how she has been navigating steps prior to surgery. She reports she feels safe with this task and has no further questions.  Wheelchair Mobility    Modified Rankin (Stroke Patients Only)       Balance Overall balance assessment: No apparent balance deficits (not formally assessed)                                  Cognition Arousal/Alertness: Awake/alert Behavior During Therapy: WFL for tasks assessed/performed Overall Cognitive Status: Within Functional Limits for tasks assessed                      Exercises Total Joint Exercises Ankle Circles/Pumps: AROM;Both;10 reps;Seated    General Comments General comments (skin integrity, edema, etc.): Reviewed stair navigation and exercises. Discussed use of ice intermittently as a modality for swelling and pain.      Pertinent Vitals/Pain 2/10 pain Pt repositioned in chair for comfort    Home Living Family/patient expects to be discharged to:: Private residence Living Arrangements: Spouse/significant other Available Help at Discharge: Family;Available 24 hours/day Type of Home: House Home Access: Stairs to enter Entrance Stairs-Rails: Right;Left Home Layout: One level Home Equipment: Cane - single point;Walker - 2 wheels      Prior Function Level of Independence: Independent with assistive device(s)  Gait /  Transfers Assistance Needed: Used SPC for mobility at home and work ADL's / [employer] CorporationHomemaking Assistance Needed: Ocassional A for donning/doffing shoes/socks     PT Goals (current goals can now be found in the care plan section) Acute Rehab PT Goals Patient Stated Goal: "Get  better so I can walk and exercise again" PT Goal Formulation: With patient Time For Goal Achievement: 12/06/13 Potential to Achieve Goals: Good Progress towards PT goals: Progressing toward goals    Frequency  7X/week    PT Plan Current plan remains appropriate    Co-evaluation             End of Session Equipment Utilized During Treatment: Gait belt Activity Tolerance: Patient tolerated treatment well Patient left: in chair;with call bell/phone within reach     Time: [id number] PT Time Calculation (min): 15 min  Charges:  $Gait Training: 8-22 mins                    G Codes:      Victoria SpillersLogan Secor Morgan, [place]PT [phone]  Berton MountLogan S Taziah Morgan 11/29/2013, 3:06 PM

## 2013-11-29 NOTE — Op Note (Signed)
NAMKelton Pillar:  JONES-DEGREAFFENREIDT, Tyshawn ACCOUNT NO.:  [id number]  MEDICAL RECORD NO.:  [id number]  LOCATION:  [id number]                        FACILITY:  [facility]  PHYSICIAN:  Harvie JuniorJohn L. Maryjayne Kleven, M.D.   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  PRINCIPAL PROCEDURE:  Right total hip replacement with a Corail stem, size 10, a 52-mm Pinnacle cup with a +4 neutral polyethylene liner, and a 36-mm +0 ceramic 36-mm hip ball.  SURGEON:  Harvie JuniorJohn L. Tulsi Crossett, M.D.  ASSISTANT:  Marshia LyJames Bethune, P.A.  ANESTHESIA:  General.  BRIEF HISTORY:  Ms. Ula LingoJones-Degreaffenreidt is a 58 year old female with a long history of having had severe pain in her right hip.  She presented to our office with severe lateralization and collapse, and after long discussion about treatment options, we felt that the only reasonable course of action would be right total hip replacement.  We discussed anterior versus posterior approach, but given her young age, we felt that anterior approach with a ceramic hip will be appropriate.  The patient did have known low platelet count preoperatively and she had seen her hematologist, who had no significant recommendations a month prior to surgery.  She was brought to the operating room for total hip replacement.  DESCRIPTION OF PROCEDURE:  The patient was taken to the operating room. After adequate anesthesia was obtained with general anesthetic, the patient was placed supine on the operating table, moved onto the Hana bed and preoperative images were taken.  Once this was done, the right hip was prepped and draped in usual sterile fashion, and following this, a small incision was made  for an anterior approach to the hip subcutaneous tissue down the level of the tensor fascia.  The tensor fascia was divided in line with its fibers and retractors were put in place superior to the femoral neck, one inferior to the neck.  At this point, the circumflex vessels were identified and cauterized, and the fascia from the vastus was opened slightly distally.  There was significant amount of oozing encountered in the dissection through the fat layer and we were careful to really gain hemostasis in all these areas.  Once this was completed, attention was turned towards the back to the hip and a rent was made between the anterior capsule and superior capsule, and this was taken down the shoulder of the hip and around anteriorly towards the lesser trochanter.  Once this was done, the head was identified, traction was placed and some levering of the head was done.  A corkscrew was then advanced into the femoral head and the external rotation was undertaken and a hip skid was used to lateralize and dislocate the hip.  Hip was then put back in place, and provisional neck cut was made, and the head was removed.  Attention was then turned to the acetabulum, which was sequentially reamed to a level of 51 and a 52-mm Pinnacle cup with central hole only was used and this was put with 20 degrees of  anteversion and 45 degrees of lateral opening.  Once this put in place, a hole eliminator was used and a +4 neutral liner was then placed at this point.  Once this was completed, attention was turned towards this stem side.  The plate was held in neutral and a femoral hook was used.  The leg was then externally rotated to 90 and moved down and over.  The trochanteric retractor was put in place.  The posterior hip capsule was released as well as the piriformis and this gave access into the hip, sequentially rasped to a level of 10, 10 went in quite a lot.  We tried to put 11 and 11 was too long, went  back to the 10, sunk it in and used a calcar planer and then did a trial reduction, which was pretty close.  At this point, we countersunk the hip a little bit more, calcar plane down the little bit further and then put in a size 10 Corail stem with a +0 36-mm ceramic hip ball and got a reduction, got our final films at this point and this showed excellent alignment and fit and fill of the stem with symmetrical leg length.  At this point, the wound was copiously irrigated, suctioned dry.  The anterior capsule was closed with 1 Vicryl running, the tensor fascia was closed with 0 Vicryl running and the skin with 0 and 2-0 Vicryl and skin staples. Sterile compressive dressing was applied and the patient was taken to the recovery room.  The estimated blood loss for the procedure was 1900 mL, which we had discussed with Anesthesia intraoperatively.  She did seem to be a little bit oozy.  Her intraoperative hemoglobin was 9 and we felt satisfied with that, but a repeat towards the very end of the case showed her hemoglobin had dropped to 7, so I do think that this was real based on her low platelet count, the oozy nature of the case, so we are going to start transfusing her in the recovery room and we will keep a close monitoring of that.     Harvie JuniorJohn L. Tamre Cass, M.D.     Ranae PlumberJLG/MEDQ  D:  11/28/2013  T:  11/29/2013  Job:  [id number]

## 2013-11-29 NOTE — Progress Notes (Signed)
Subjective: 1 Day Post-Op Procedure(s) (LRB): TOTAL HIP ARTHROPLASTY ANTERIOR APPROACH (Right) Patient reports pain as 3 on 0-10 scale.  No dizziness or SOB. Up to chair.   Objective: Vital signs in last 24 hours: Temp:  [97.3 F (36.3 C)-98.7 F (37.1 C)] 98.7 F (37.1 C) (04/21 0645) Pulse Rate:  [47-75] 73 (04/21 0645) Resp:  [11-20] 18 (04/21 0645) BP: (119-170)/(55-83) 128/55 mmHg (04/21 0645) SpO2:  [98 %-100 %] 98 % (04/21 0645) Weight:  [99.338 kg (219 lb)] 99.338 kg (219 lb) (04/20 1025)  Intake/Output from previous day: 04/20 0701 - 04/21 0700 In: 6010 [P.O.:360; I.V.:4900; IV Piggyback:750] Out: 7250 [Urine:5350; Blood:1900] Intake/Output this shift:     Recent Labs  11/28/13 2305 11/29/13 0550  HGB 11.0* 10.4*    Recent Labs  11/28/13 2305 11/29/13 0550  WBC 14.9* 13.6*  RBC 3.64* 3.42*  HCT 31.2* 29.2*  PLT 66* 77*    Recent Labs  11/29/13 0550  NA 141  K 4.2  CL 106  CO2 23  BUN 10  CREATININE 0.79  GLUCOSE 116*  CALCIUM 9.4    Recent Labs  11/29/13 0550  INR 1.15  Right knee exam:  Neurovascular intact Sensation intact distally Intact pulses distally Dorsiflexion/Plantar flexion intact Incision: dressing C/D/I Compartment soft  Assessment/Plan: 1 Day Post-Op Procedure(s) (LRB): TOTAL HIP ARTHROPLASTY ANTERIOR APPROACH (Right) Thrombocytopenia    Stable ABLA   Given 2 units of PRBC's yesterday in PACU    Plan: Cont oral Iron Up with therapy Discharge home with home health tomorrow. Check CBC/platelets in AM ASA 325mg  BID with Scd's for VTE prophylaxis WBAT Matthew FolksJames G Kearia Yin 11/29/2013, 9:33 AM

## 2013-11-29 NOTE — Progress Notes (Signed)
Utilization review completed.  

## 2013-11-30 LAB — POCT I-STAT 4, (NA,K, GLUC, HGB,HCT)
Glucose, Bld: 125 mg/dL — ABNORMAL HIGH (ref 70–99)
Glucose, Bld: 130 mg/dL — ABNORMAL HIGH (ref 70–99)
HCT: 21 % — ABNORMAL LOW (ref 36.0–46.0)
HEMATOCRIT: 27 % — AB (ref 36.0–46.0)
HEMOGLOBIN: 9.2 g/dL — AB (ref 12.0–15.0)
Hemoglobin: 7.1 g/dL — ABNORMAL LOW (ref 12.0–15.0)
POTASSIUM: 3.3 meq/L — AB (ref 3.7–5.3)
Potassium: 3.4 mEq/L — ABNORMAL LOW (ref 3.7–5.3)
SODIUM: 140 meq/L (ref 137–147)
SODIUM: 140 meq/L (ref 137–147)

## 2013-11-30 LAB — CBC
HCT: 25.7 % — ABNORMAL LOW (ref 36.0–46.0)
HEMOGLOBIN: 9.1 g/dL — AB (ref 12.0–15.0)
MCH: 30.1 pg (ref 26.0–34.0)
MCHC: 35.4 g/dL (ref 30.0–36.0)
MCV: 85.1 fL (ref 78.0–100.0)
Platelets: 117 10*3/uL — ABNORMAL LOW (ref 150–400)
RBC: 3.02 MIL/uL — AB (ref 3.87–5.11)
RDW: 14.2 % (ref 11.5–15.5)
WBC: 23.4 10*3/uL — ABNORMAL HIGH (ref 4.0–10.5)

## 2013-11-30 LAB — PROTIME-INR
INR: 1.05 (ref 0.00–1.49)
PROTHROMBIN TIME: 13.5 s (ref 11.6–15.2)

## 2013-11-30 MED ORDER — ASPIRIN EC 325 MG PO TBEC: 325.0000 mg | DELAYED_RELEASE_TABLET | Freq: Two times a day (BID) | ORAL | Status: DC

## 2013-11-30 NOTE — Progress Notes (Signed)
Physical Therapy Treatment Patient Details Name: Victoria PillarShelia Jones-Degreaffenreidt MRN: [id number] DOB: 03/03/1956 Today's Date: 11/30/2013    History of Present Illness Pt is s/p R THA - Direct anterior approach    PT Comments    Pt has completed stair training and is ambulating with supervision. Pt has strong family support and will benefit from continued skilled PT in home setting to improve independence with functional mobility. All education and exercises have been reviewed and pt has no further questions at this time. Pt adequate for d/c from PT standpoint.  Follow Up Recommendations  Home health PT;Supervision for mobility/OOB     Equipment Recommendations  3in1 (PT)    Recommendations for Other Services OT consult     Precautions / Restrictions Precautions Precautions: None Precaution Comments: Direct anterior approach - no precautions Restrictions Weight Bearing Restrictions: Yes RLE Weight Bearing: Weight bearing as tolerated    Mobility  Bed Mobility                  Transfers Overall transfer level: Modified independent Equipment used: Rolling walker (2 wheeled) Transfers: Sit to/from Stand Sit to Stand: Modified independent (Device/Increase time)         General transfer comment: Mod I with sit<>stand from recliner. Pt requires extra time but demonstrates safe technique.  Ambulation/Gait Ambulation/Gait assistance: Supervision Ambulation Distance (Feet): 85 Feet Assistive device: Rolling walker (2 wheeled) Gait Pattern/deviations: Step-through pattern;Decreased step length - left;Decreased stance time - right;Antalgic;Decreased weight shift to right     General Gait Details: Gait training focusing on increased left step length, advancing RLE forward and prventing exaggerated left lateral list.  Overall, ambulating safely with no loss of balance. Supervision only for safety   Stairs Stairs: Yes Stairs assistance: Supervision Stair Management:  Two rails;Step to pattern;Forwards Number of Stairs: 2 General stair comments: Daughter was present and educated on how pt is to safely navigate steps. Pt needed min cues for sequencing and was able to teach back correctly. Pt and daugther have no further questions about this task and state they feel comfortable with ascend/descend steps.  Wheelchair Mobility    Modified Rankin (Stroke Patients Only)       Balance                                    Cognition Arousal/Alertness: Awake/alert Behavior During Therapy: WFL for tasks assessed/performed Overall Cognitive Status: Within Functional Limits for tasks assessed                      Exercises      General Comments General comments (skin integrity, edema, etc.): Answered a list of questions pt has writtent for PT concerning mobility at home. Pt educated on daily mobility and exercise to progress strength and functional mobility at home.      Pertinent Vitals/Pain No numerical pain value- states she is a little more sore today. Pt repositioned in chair for comfort    Home Living                      Prior Function            PT Goals (current goals can now be found in the care plan section) Progress towards PT goals: Progressing toward goals    Frequency  7X/week    PT Plan Current plan remains appropriate    Co-evaluation  End of Session Equipment Utilized During Treatment: Gait belt Activity Tolerance: Patient tolerated treatment well Patient left: in chair;with call bell/phone within reach     Time: 1046-1056 PT Time Calculation (min): 10 min  Charges:  $Gait Training: 8-22 mins                    G Codes:      Sunday SpillersLogan Secor Limestone[place]bour, [place]PT [phone]  Berton MountLogan S Lynford Espinoza 11/30/2013, 11:42 AM

## 2013-11-30 NOTE — Progress Notes (Signed)
OT Cancellation and Discharge Note  Patient Details Name: Victoria Morgan MRN: [id number] DOB: 05/18/1956   Cancelled Treatment:    Reason Eval/Treat Not Completed: Patient declined, no reason specified. OT made visit this date to address tub transfer and LB ADLs. Pt reports no concerns and she has a tub bench at home. Pt doing wellw with PT at supervision level. Acute OT to sign off at this time.   Rae LipsLeeann M Kempton Milne [phone] 11/30/2013, 12:06 PM

## 2013-11-30 NOTE — Progress Notes (Signed)
Subjective: 2 Days Post-Op Procedure(s) (LRB): TOTAL HIP ARTHROPLASTY ANTERIOR APPROACH (Right) Patient reports pain as mild.    Objective: Vital signs in last 24 hours: Temp:  [97.9 F (36.6 C)-98.7 F (37.1 C)] 97.9 F (36.6 C) (04/22 0628) Pulse Rate:  [82-91] 82 (04/22 0628) Resp:  [18] 18 (04/22 0628) BP: (124-130)/(55-59) 130/59 mmHg (04/22 0628) SpO2:  [97 %-99 %] 99 % (04/22 0628)  Intake/Output from previous day: 04/21 0701 - 04/22 0700 In: 1080 [P.O.:1080] Out: 400 [Urine:400] Intake/Output this shift:     Recent Labs  11/28/13 2305 11/29/13 0550 11/30/13 0432  HGB 11.0* 10.4* 9.1*    Recent Labs  11/29/13 0550 11/30/13 0432  WBC 13.6* 23.4*  RBC 3.42* 3.02*  HCT 29.2* 25.7*  PLT 77* 117*    Recent Labs  11/29/13 0550  NA 141  K 4.2  CL 106  CO2 23  BUN 10  CREATININE 0.79  GLUCOSE 116*  CALCIUM 9.4    Recent Labs  11/29/13 0550 11/30/13 0432  INR 1.15 1.05    Neurologically intact ABD soft Neurovascular intact Sensation intact distally Intact pulses distally Dorsiflexion/Plantar flexion intact No cellulitis present Compartment soft  Assessment/Plan: 2 Days Post-Op Procedure(s) (LRB): TOTAL HIP ARTHROPLASTY ANTERIOR APPROACH (Right) Advance diet Up with therapy Discharge home with home health after therapy today  Harvie JuniorJohn L Carthel Castille 11/30/2013, 7:12 AM

## 2013-11-30 NOTE — Discharge Summary (Signed)
Patient ID: Victoria Morgan MRN: [id number] DOB/AGE: 56-Jun-1957 56 y.o.  Admit date: 11/28/2013 Discharge date: 11/30/2013  Admission Diagnoses:  Principal Problem:   Osteoarthritis of right hip Active Problems:   Acute blood loss anemia   Thrombocytopenia, unspecified   Discharge Diagnoses:  Same  Past Medical History  Diagnosis Date  . Asthma   . Blood dyscrasia     ITP   dx in 1990  . Arthritis     Surgeries: Procedure(s):RIGHT TOTAL HIP ARTHROPLASTY ANTERIOR APPROACH on 11/28/2013      Discharged Condition: Improved  Hospital Course: Victoria Morgan is an 58 y.o. female who was admitted 11/28/2013 for operative treatment ofOsteoarthritis of right hip. Patient has severe unremitting pain that affects sleep, daily activities, and work/hobbies. After pre-op clearance the patient was taken to the operating room on 11/28/2013 and underwent  Procedure(s):Right TOTAL HIP ARTHROPLASTY ANTERIOR APPROACH.    Patient was given perioperative antibiotics: Anti-infectives   Start     Dose/Rate Route Frequency Ordered Stop   11/28/13 2100  ceFAZolin (ANCEF) IVPB 2 g/50 mL premix     2 g 100 mL/hr over 30 Minutes Intravenous Every 6 hours 11/28/13 1955 11/29/13 0449   11/28/13 1050  ceFAZolin (ANCEF) 2-3 GM-% IVPB SOLR    Comments:  Elliott, Beth   : cabinet override      11/28/13 1050 11/28/13 1252   11/27/13 1137  ceFAZolin (ANCEF) IVPB 2 g/50 mL premix  Status:  Discontinued     2 g 100 mL/hr over 30 Minutes Intravenous On call to O.R. 11/27/13 1137 11/28/13 1955       Patient was given sequential compression devices, early ambulation, and chemoprophylaxis to prevent DVT.She had significant blood loss intra op and hgb was found to be 7.0.  2 units of pRBC'S were transfused in the recovery room.  Hgb and platelet counts were followed daily and the pt was prescribed iron while in the hospital.  Patient benefited maximally from hospital stay and there were  no complications.    Recent vital signs: Patient Vitals for the past 24 hrs:  BP Temp Pulse Resp SpO2  11/30/13 0628 130/59 mmHg 97.9 F (36.6 C) 82 18 99 %  11/29/13 2111 125/56 mmHg 98.7 F (37.1 C) 84 18 99 %     Recent laboratory studies:  Recent Labs  11/29/13 0550 11/30/13 0432  WBC 13.6* 23.4*  HGB 10.4* 9.1*  HCT 29.2* 25.7*  PLT 77* 117*  NA 141  --   K 4.2  --   CL 106  --   CO2 23  --   BUN 10  --   CREATININE 0.79  --   GLUCOSE 116*  --   INR 1.15 1.05  CALCIUM 9.4  --      Discharge Medications:     Medication List    STOP taking these medications       meloxicam 15 MG tablet  Commonly known as:  MOBIC      TAKE these medications       aspirin EC 325 MG tablet  Take 1 tablet (325 mg total) by mouth 2 (two) times daily after a meal. Take x 3 weeks post op to decrease risk of blood clots.     CALCIUM + D PO  Take 1 tablet by mouth daily.     cetirizine 10 MG tablet  Commonly known as:  ZYRTEC  Take 10 mg by mouth daily.     CLEAR EYES OP  Place  1 drop into both eyes daily.     GARCINIA CAMBOGIA-CHROMIUM PO  Take 1 tablet by mouth daily.     hydrochlorothiazide 25 MG tablet  Commonly known as:  HYDRODIURIL  Take 25 mg by mouth daily as needed (for fluid).     Melatonin 10 MG Caps  Take 10 mg by mouth at bedtime.     methocarbamol 750 MG tablet  Commonly known as:  ROBAXIN-750  Take 1 tablet (750 mg total) by mouth every 8 (eight) hours as needed for muscle spasms.     multivitamin with minerals Tabs tablet  Take 1 tablet by mouth daily.     oxyCODONE-acetaminophen 5-325 MG per tablet  Commonly known as:  PERCOCET/ROXICET  Take 1-2 tablets by mouth every 6 (six) hours as needed for severe pain.     traMADol 50 MG tablet  Commonly known as:  ULTRAM  Take 50 mg by mouth 2 (two) times daily.     TYLENOL ARTHRITIS PAIN 650 MG CR tablet  Generic drug:  acetaminophen  Take 650 mg by mouth 2 (two) times daily as needed for pain.         Diagnostic Studies: Dg Chest 2 View  11/18/2013   CLINICAL DATA:  Preop right hip surgery.  EXAM: CHEST  2 VIEW  COMPARISON:  None.  FINDINGS: Heart is upper limits normal in size. Lungs are clear. No effusions. No acute bony abnormality. 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  IMPRESSION: No acute cardiopulmonary disease.   Electronically Signed   By: Charlett NoseKevin  Dover M.D.   On: 11/18/2013 16:42   Dg Hip Operative Right  11/28/2013   CLINICAL DATA:  Hip replacement.  EXAM: DG OPERATIVE RIGHT HIP  TECHNIQUE: A single spot fluoroscopic AP image of the right hip is submitted.  COMPARISON:  None.  FINDINGS: We are provided with 2 fluoroscopic spot views of the right hip. Images demonstrate a total arthroplasty device in place. No fracture or dislocation is identified.  IMPRESSION: Right hip replacement.  No acute finding.   Electronically Signed   By: Drusilla Kannerhomas  Dalessio M.D.   On: 11/28/2013 16:43   Dg Pelvis Portable  11/28/2013   CLINICAL DATA:  postop  EXAM: PORTABLE PELVIS 1-2 VIEWS  COMPARISON:  DG HIP OPERATIVE*R* dated 11/28/2013  FINDINGS: Patient is status post right hip arthroplasty. Hardware native osseous structures are intact. Postsurgical changes are appreciated within the soft tissues of the right hip.  IMPRESSION: Patient is status post total right hip arthroplasty.   Electronically Signed   By: Salome HolmesHector  Cooper M.D.   On: 11/28/2013 18:01   Dg Hip Portable 1 View Right  11/28/2013   CLINICAL DATA:  Postop right total hip.  EXAM: PORTABLE RIGHT HIP - 1 VIEW  COMPARISON:  Intraoperative imaging performed today.  FINDINGS: Cross-table lateral view is limited. There appears to be normal alignment. No visible hardware or bony complicating feature.  IMPRESSION: Normal alignment on the cross-table lateral view.   Electronically Signed   By: Charlett NoseKevin  Dover M.D.   On: 11/28/2013 17:40    Disposition: Home with HHPT      Discharge Orders   Future Orders Complete  By Expires   Call MD / Call 911  As directed    Constipation Prevention  As directed    Diet general  As directed    Follow the hip precautions as taught in Physical Therapy  As directed    Increase activity slowly as  tolerated  As directed    Weight bearing as tolerated  As directed    Questions:     Laterality:  right   Extremity:  Lower   Weight bearing as tolerated  As directed    Questions:     Laterality:  right   Extremity:  Lower      Follow-up Information   Follow up with GRAVES,JOHN L, MD. Schedule an appointment as soon as possible for a visit in 2 weeks.   Specialty:  Orthopedic Surgery   Contact information:   [address] [place] [address] [phone]       Follow up with [facility]. (Someone from [facility] will contact you concerning start date and time for physical therapy.)    Contact information:   [address] [place] [address] [phone]       Follow up with GRAVES,JOHN L, MD In 2 weeks.   Specialty:  Orthopedic Surgery   Contact information:   [address] [place] [address] [phone]        Signed: Matthew FolksJames G Andilyn Bettcher 11/30/2013, 3:38 PM

## 2013-12-01 LAB — TYPE AND SCREEN
ABO/RH(D): O POS
Antibody Screen: NEGATIVE
UNIT DIVISION: 0
Unit division: 0
Unit division: 0
Unit division: 0

## 2013-12-12 ENCOUNTER — Ambulatory Visit: Payer: Self-pay | Admitting: Internal Medicine

## 2013-12-12 LAB — CANCER CTR PLATELET CT: Platelet: 247 x10 3/mm (ref 150–440)

## 2013-12-28 LAB — CANCER CTR PLATELET CT: PLATELETS: 93 x10 3/mm — AB (ref 150–440)

## 2014-01-09 ENCOUNTER — Ambulatory Visit: Payer: Self-pay | Admitting: Internal Medicine

## 2014-01-10 ENCOUNTER — Ambulatory Visit: Payer: Self-pay | Admitting: Unknown Physician Specialty

## 2014-01-24 LAB — CANCER CTR PLATELET CT: Platelet: 48 x10 3/mm — ABNORMAL LOW (ref 150–440)

## 2014-01-27 LAB — PLATELET COUNT: PLATELETS: 62 10*3/uL — AB (ref 150–440)

## 2014-02-08 ENCOUNTER — Ambulatory Visit: Payer: Self-pay | Admitting: Internal Medicine

## 2014-03-05 ENCOUNTER — Ambulatory Visit: Payer: Self-pay | Admitting: Physician Assistant

## 2014-03-09 DIAGNOSIS — G8929 Other chronic pain: Secondary | ICD-10-CM | POA: Insufficient documentation

## 2014-03-09 DIAGNOSIS — M545 Low back pain, unspecified: Secondary | ICD-10-CM | POA: Insufficient documentation

## 2014-03-09 DIAGNOSIS — M5416 Radiculopathy, lumbar region: Secondary | ICD-10-CM | POA: Insufficient documentation

## 2014-03-09 DIAGNOSIS — Z96643 Presence of artificial hip joint, bilateral: Secondary | ICD-10-CM | POA: Insufficient documentation

## 2014-03-09 DIAGNOSIS — D573 Sickle-cell trait: Secondary | ICD-10-CM | POA: Insufficient documentation

## 2014-03-09 DIAGNOSIS — R609 Edema, unspecified: Secondary | ICD-10-CM | POA: Insufficient documentation

## 2014-03-09 DIAGNOSIS — L309 Dermatitis, unspecified: Secondary | ICD-10-CM | POA: Insufficient documentation

## 2014-03-09 DIAGNOSIS — E7800 Pure hypercholesterolemia, unspecified: Secondary | ICD-10-CM | POA: Insufficient documentation

## 2014-03-14 ENCOUNTER — Ambulatory Visit: Payer: Self-pay | Admitting: Internal Medicine

## 2014-03-14 LAB — CANCER CTR PLATELET CT: Platelet: 74 x10 3/mm — ABNORMAL LOW (ref 150–440)

## 2014-04-11 ENCOUNTER — Ambulatory Visit: Payer: Self-pay | Admitting: Internal Medicine

## 2014-04-25 ENCOUNTER — Other Ambulatory Visit: Payer: Self-pay | Admitting: Orthopedic Surgery

## 2014-05-01 NOTE — Pre-Procedure Instructions (Addendum)
Victoria Morgan  05/01/2014   Your procedure is scheduled on:  May 15 2014  Report to [facility] Admitting at 1030 AM.  Call this number if you have problems the morning of surgery: [phone]   Remember:   Do not eat food or drink liquids after midnight.   Take these medicines the morning of surgery with A SIP OF WATE, eye drops,pain med if needed  Stop taking Aspirin, Ibuprofen, BC's, Goody's, Aleve, Herbal medications, and Fish Oil,garcinia-cambogia, melatonin,vitamins,meloxicam on 05/10/14   Do not wear jewelry, make-up or nail polish.  Do not wear lotions, powders, or perfumes. You may wear deodorant.  Do not shave 48 hours prior to surgery. Men may shave face and neck.  Do not bring valuables to the hospital.  [facility] is not responsible  for any belongings or valuables.               Contacts, dentures or bridgework may not be worn into surgery.  Leave suitcase in the car. After surgery it may be brought to your room.  For patients admitted to the hospital, discharge time is determined by your treatment team.               Patients discharged the day of surgery will not be allowed to drive  home.    Special Instructions: .[place] - Preparing for Surgery  Before surgery, you can play an important role.  Because skin is not sterile, your skin needs to be as free of germs as possible.  You can reduce the number of germs on you skin by washing with CHG (chlorahexidine gluconate) soap before surgery.  CHG is an antiseptic cleaner which kills germs and bonds with the skin to continue killing germs even after washing.  Please DO NOT use if you have an allergy to CHG or antibacterial soaps.  If your skin becomes reddened/irritated stop using the CHG and inform your nurse when you arrive at Short Stay.  Do not shave (including legs and underarms) for at least 48 hours prior to the first CHG shower.  You may shave your face.  Please follow these  instructions carefully:   1.  Shower with CHG Soap the night before surgery and the                                morning of Surgery.  2.  If you choose to wash your hair, wash your hair first as usual with your normal shampoo.  3.  After you shampoo, rinse your hair and body thoroughly to remove the    Shampoo.  4.  Use CHG as you would any other liquid soap.  You can apply chg directly  to the skin and wash gently with scrungie or a clean washcloth.  5.  Apply the CHG Soap to your body ONLY FROM THE NECK DOWN.    Do not use on open wounds or open sores.  Avoid contact with your eyes,       ears, mouth and genitals (private parts).  Wash genitals (private parts) with your normal soap.  6.  Wash thoroughly, paying special attention to the area where your surgery will be performed.  7.  Thoroughly rinse your body with warm water from the neck down.  8.  DO NOT shower/wash with your normal soap after using and rinsing off  the CHG Soap.  9.  Pat yourself dry  with a clean towel.            10.  Wear clean pajamas.            11.  Place clean sheets on your bed the night of your first shower and do not sleep with pets.  Day of Surgery  Do not apply any lotions/deoderants the morning of surgery.  Please wear clean clothes to the hospital/surgery center.      Please read over the following fact sheets that you were given: Pain Booklet, Coughing and Deep Breathing, Blood Transfusion Information and Surgical Site Infection Prevention

## 2014-05-02 ENCOUNTER — Encounter (HOSPITAL_COMMUNITY): Payer: Self-pay

## 2014-05-02 ENCOUNTER — Encounter (HOSPITAL_COMMUNITY)
Admission: RE | Admit: 2014-05-02 | Discharge: 2014-05-02 | Disposition: A | Discharge status: Home / Self Care | Payer: 59 | Source: Ambulatory Visit | Attending: Orthopedic Surgery | Admitting: Orthopedic Surgery

## 2014-05-02 DIAGNOSIS — Z01812 Encounter for preprocedural laboratory examination: Secondary | ICD-10-CM | POA: Diagnosis present

## 2014-05-02 DIAGNOSIS — M1610 Unilateral primary osteoarthritis, unspecified hip: Secondary | ICD-10-CM | POA: Diagnosis present

## 2014-05-02 DIAGNOSIS — M169 Osteoarthritis of hip, unspecified: Secondary | ICD-10-CM | POA: Insufficient documentation

## 2014-05-02 LAB — CBC WITH DIFFERENTIAL/PLATELET
BASOS PCT: 1 % (ref 0–1)
Basophils Absolute: 0 10*3/uL (ref 0.0–0.1)
Eosinophils Absolute: 0.2 10*3/uL (ref 0.0–0.7)
Eosinophils Relative: 4 % (ref 0–5)
HEMATOCRIT: 33.7 % — AB (ref 36.0–46.0)
HEMOGLOBIN: 11.5 g/dL — AB (ref 12.0–15.0)
LYMPHS ABS: 1.6 10*3/uL (ref 0.7–4.0)
Lymphocytes Relative: 26 % (ref 12–46)
MCH: 29.8 pg (ref 26.0–34.0)
MCHC: 34.1 g/dL (ref 30.0–36.0)
MCV: 87.3 fL (ref 78.0–100.0)
MONO ABS: 0.6 10*3/uL (ref 0.1–1.0)
MONOS PCT: 9 % (ref 3–12)
NEUTROS ABS: 3.7 10*3/uL (ref 1.7–7.7)
Neutrophils Relative %: 60 % (ref 43–77)
Platelets: 67 10*3/uL — ABNORMAL LOW (ref 150–400)
RBC: 3.86 MIL/uL — AB (ref 3.87–5.11)
RDW: 14.6 % (ref 11.5–15.5)
WBC: 6.2 10*3/uL (ref 4.0–10.5)

## 2014-05-02 LAB — URINALYSIS, ROUTINE W REFLEX MICROSCOPIC
Bilirubin Urine: NEGATIVE
GLUCOSE, UA: NEGATIVE mg/dL
HGB URINE DIPSTICK: NEGATIVE
Ketones, ur: NEGATIVE mg/dL
LEUKOCYTES UA: NEGATIVE
Nitrite: NEGATIVE
PROTEIN: NEGATIVE mg/dL
SPECIFIC GRAVITY, URINE: 1.014 (ref 1.005–1.030)
Urobilinogen, UA: 0.2 mg/dL (ref 0.0–1.0)
pH: 6 (ref 5.0–8.0)

## 2014-05-02 LAB — COMPREHENSIVE METABOLIC PANEL
ALBUMIN: 4.1 g/dL (ref 3.5–5.2)
ALT: 26 U/L (ref 0–35)
AST: 29 U/L (ref 0–37)
Alkaline Phosphatase: 97 U/L (ref 39–117)
Anion gap: 9 (ref 5–15)
BILIRUBIN TOTAL: 0.4 mg/dL (ref 0.3–1.2)
BUN: 18 mg/dL (ref 6–23)
CO2: 27 mEq/L (ref 19–32)
CREATININE: 1.14 mg/dL — AB (ref 0.50–1.10)
Calcium: 9.9 mg/dL (ref 8.4–10.5)
Chloride: 103 mEq/L (ref 96–112)
GFR calc Af Amer: 62 mL/min — ABNORMAL LOW (ref >90)
GFR, EST NON AFRICAN AMERICAN: 53 mL/min — AB (ref >90)
Glucose, Bld: 83 mg/dL (ref 70–99)
Potassium: 4 mEq/L (ref 3.7–5.3)
Sodium: 139 mEq/L (ref 137–147)
Total Protein: 7.2 g/dL (ref 6.0–8.3)

## 2014-05-02 LAB — PROTIME-INR
INR: 1 (ref 0.00–1.49)
PROTHROMBIN TIME: 13.2 s (ref 11.6–15.2)

## 2014-05-02 LAB — SURGICAL PCR SCREEN
MRSA, PCR: NEGATIVE
Staphylococcus aureus: NEGATIVE

## 2014-05-02 LAB — APTT: APTT: 30 s (ref 24–37)

## 2014-05-03 NOTE — Progress Notes (Addendum)
Anesthesia Chart Review:  Pt is 58 year old female scheduled for L anterior hip replacement on 05/15/14 with Dr. Luiz Blare.   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 BMI 38.  Hematologist is Dr. Benita Gutter. PCP Dr. Mickey Farber.   EKG on 11/18/13 showed NSR.   CXR on 11/18/13 showed no acute cardiopulmonary disease.   Preoperative labs noted. PLT count 67K. H/H 11.5/33.7. PT/PTT WNL. UA normal.   In previous anesthesia chart review by Shonna Chock, dated 11/24/13 pre-op for R hip replacement notes patient reported that usual PLT count runs ~ 80-120K. In 11/2013, records from Dr. Neale Burly reviewed:  last visit was in 06/2013. Her ITP has been steroid responsive in the past. Her last two PLT count results there were 100K on 09/20/13 and 87K on 11/01/13.   Pt tolerated anesthesia without complications for previous R hip replacement 11/28/13.    Left message with Albin Felling in Dr. Luiz Blare' office regarding low platelets suggesting he speak with Dr. Neale Burly about any treatments the pt may need prior to surgery. Will recheck platelet count DOS.   Rica Mast, FNP-BC [facility] Short Stay Surgical Center/Anesthesiology Phone: [phone] 05/03/2014 3:23 PM  05/11/14: Shonna Chock, PA received call from Darl Pikes in Dr. Luiz Blare' office who reported pt was seen by Dr. Neale Burly today. Dr. Neale Burly requests pt have CBC day of surgery. Plt count originally ordered has been cancelled, CBC ordered.   Rica Mast, FNP-BC [facility] Short Stay Surgical Center/Anesthesiology Phone: [phone] 05/11/2014 2:41 PM

## 2014-05-08 IMAGING — CR DG CHEST 2V
2 series · 2 of 2 positions shown · non-contrast
Comparison: None.

CLINICAL DATA: Preop right hip surgery.

EXAM:
CHEST  2 VIEW

[w chest pa]
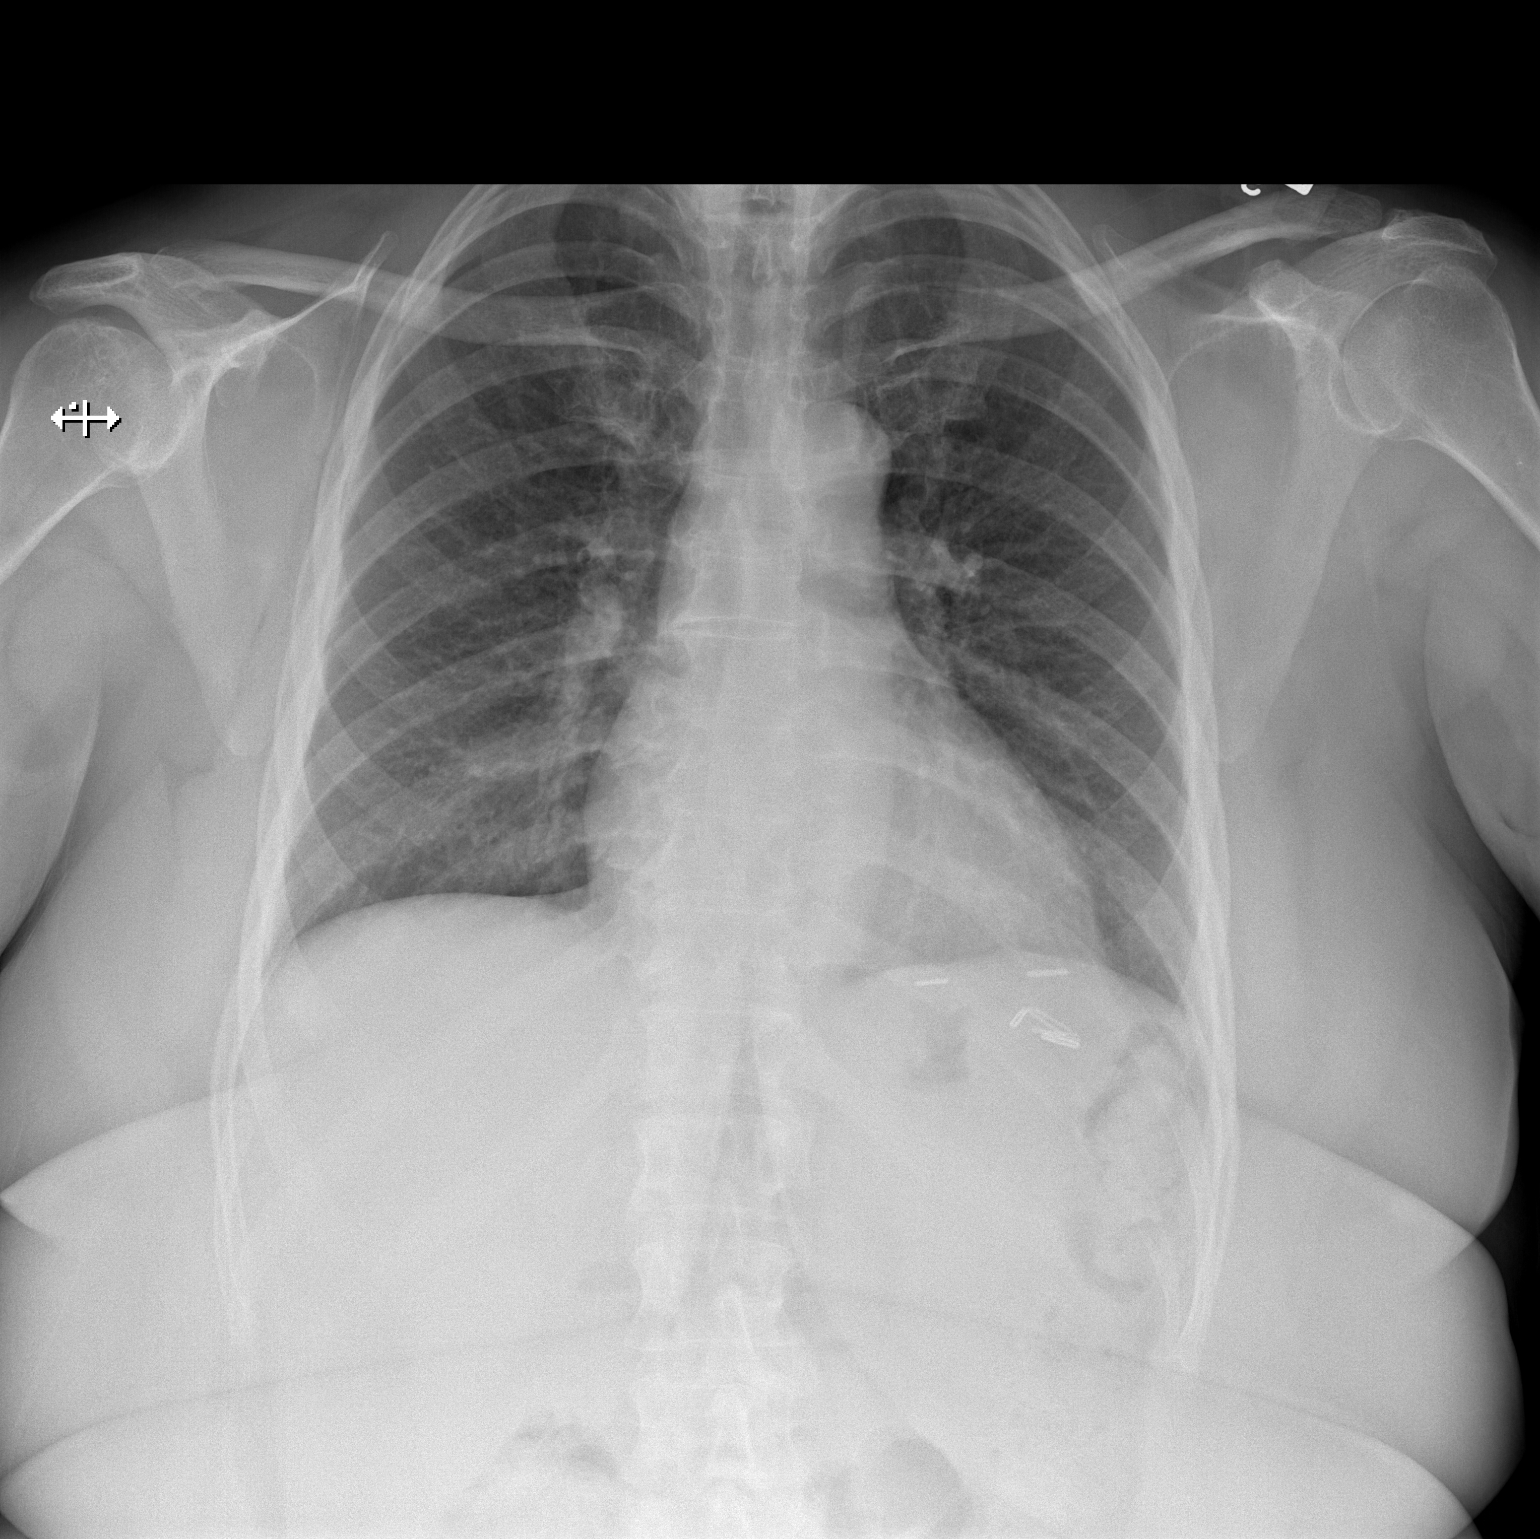

[w chest lat]
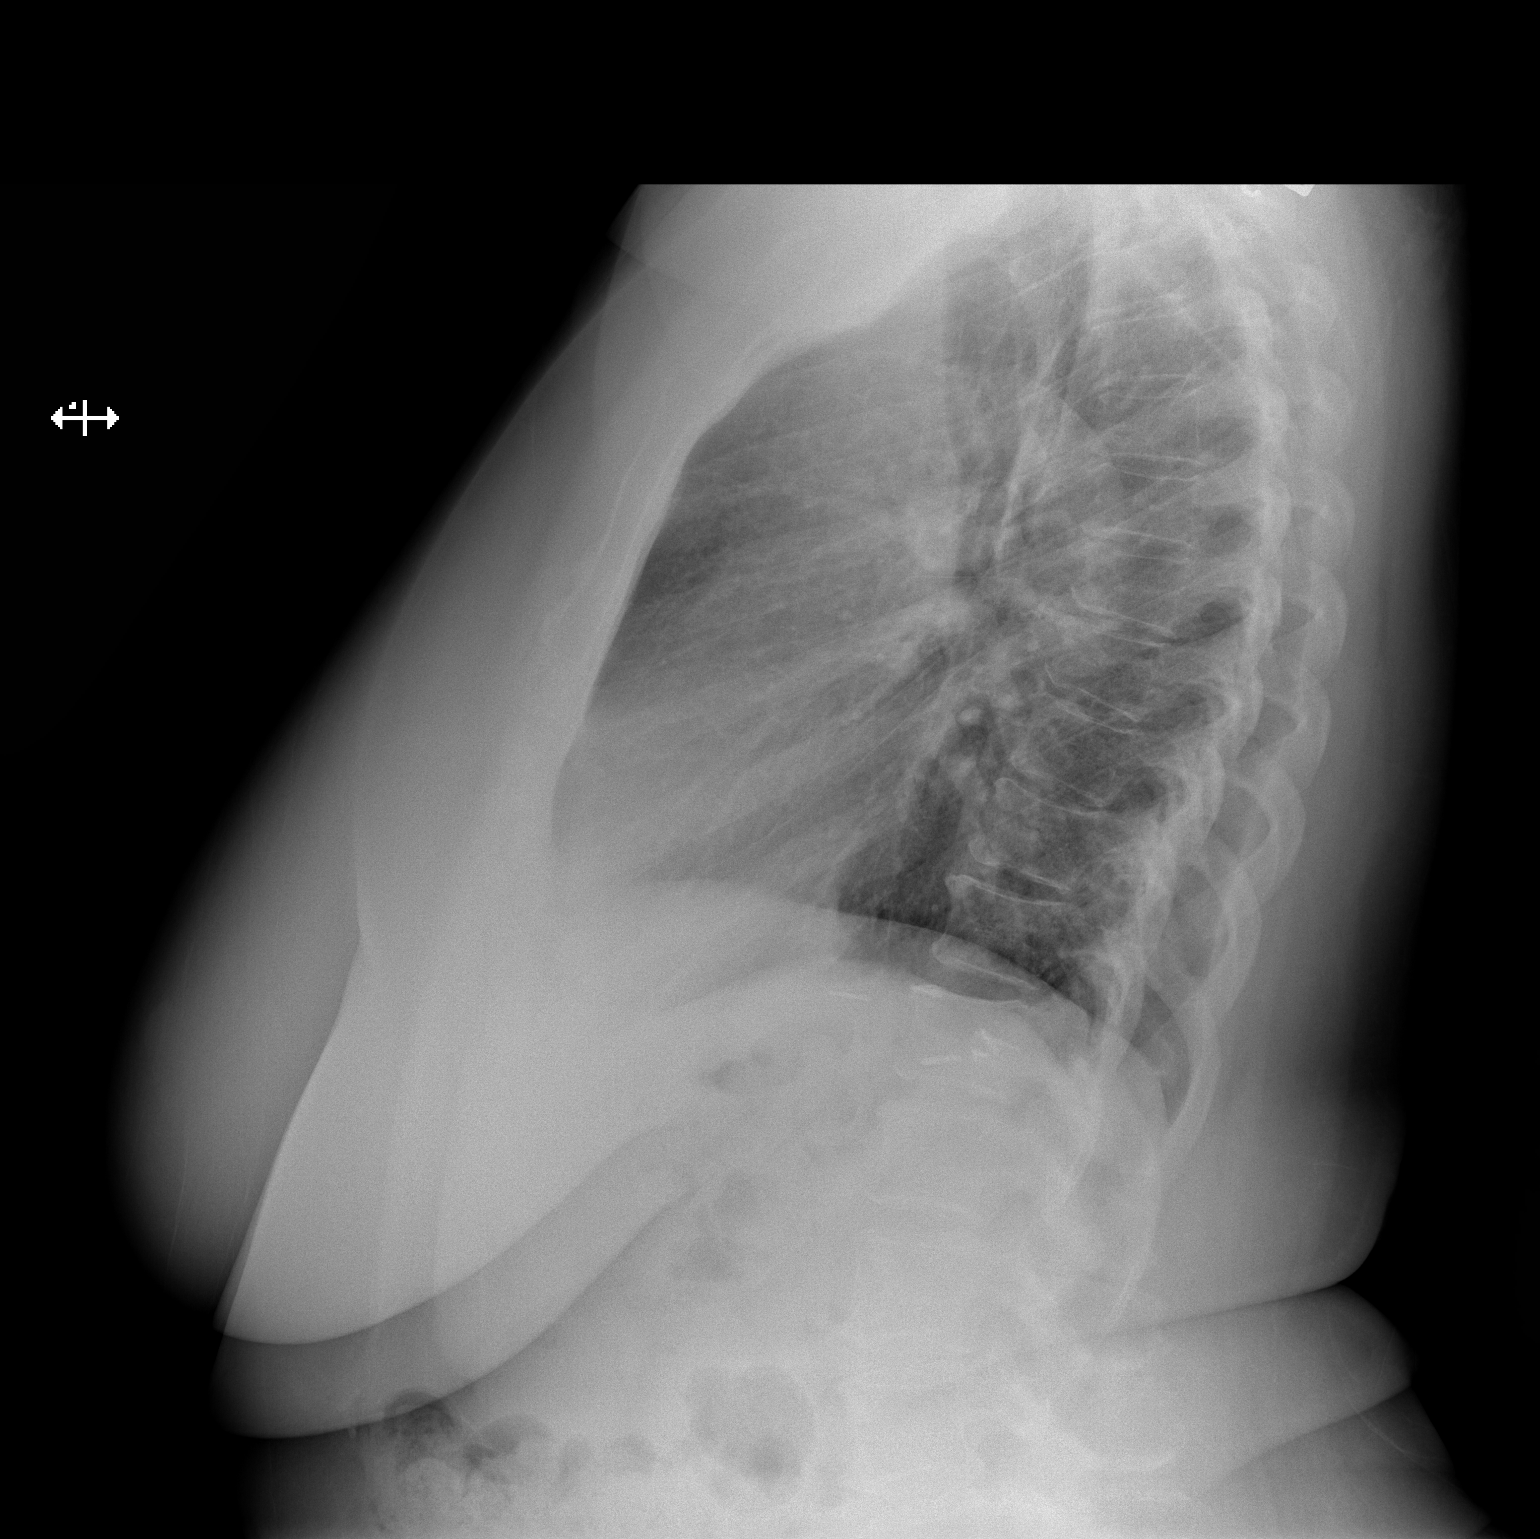

[2 of 2 positions shown; findings below may reference images not displayed]

FINDINGS: Heart is upper limits normal in size. Lungs are clear. No effusions.
No acute bony abnormality. Surgical clips in the left upper abdomen
likely from prior splenectomy.
IMPRESSION: No acute cardiopulmonary disease.

## 2014-05-09 LAB — CANCER CTR PLATELET CT: Platelet: 61 x10 3/mm — ABNORMAL LOW (ref 150–440)

## 2014-05-11 ENCOUNTER — Ambulatory Visit: Payer: Self-pay | Admitting: Internal Medicine

## 2014-05-12 LAB — PLATELET COUNT: Platelet: 140 10*3/uL — ABNORMAL LOW (ref 150–440)

## 2014-05-14 MED ORDER — CHLORHEXIDINE GLUCONATE 4 % EX LIQD
60.0000 mL | Freq: Once | CUTANEOUS | Status: DC
  Filled 2014-05-14: qty 60

## 2014-05-14 MED ORDER — CEFAZOLIN SODIUM-DEXTROSE 2-3 GM-% IV SOLR
2.0000 g | INTRAVENOUS | Status: AC
  Administered 2014-05-15: 2 g via INTRAVENOUS
  Filled 2014-05-14: qty 50

## 2014-05-15 ENCOUNTER — Encounter (HOSPITAL_COMMUNITY): Payer: 59 | Admitting: Vascular Surgery

## 2014-05-15 ENCOUNTER — Encounter (HOSPITAL_COMMUNITY)
Admission: RE | Disposition: A | Discharge status: Home Health | Payer: Self-pay | Source: Ambulatory Visit | Attending: Orthopedic Surgery

## 2014-05-15 ENCOUNTER — Encounter (HOSPITAL_COMMUNITY): Payer: Self-pay | Admitting: Anesthesiology

## 2014-05-15 ENCOUNTER — Inpatient Hospital Stay (HOSPITAL_COMMUNITY): Payer: 59

## 2014-05-15 ENCOUNTER — Inpatient Hospital Stay (HOSPITAL_COMMUNITY): Payer: 59 | Admitting: Vascular Surgery

## 2014-05-15 ENCOUNTER — Inpatient Hospital Stay (HOSPITAL_COMMUNITY)
Admission: RE | Admit: 2014-05-15 | Discharge: 2014-05-17 | DRG: 470 | Disposition: A | Discharge status: Home Health | Payer: 59 | Source: Ambulatory Visit | Attending: Orthopedic Surgery | Admitting: Orthopedic Surgery

## 2014-05-15 DIAGNOSIS — Z7952 Long term (current) use of systemic steroids: Secondary | ICD-10-CM

## 2014-05-15 DIAGNOSIS — M1612 Unilateral primary osteoarthritis, left hip: Principal | ICD-10-CM | POA: Diagnosis present

## 2014-05-15 DIAGNOSIS — Z6837 Body mass index (BMI) 37.0-37.9, adult: Secondary | ICD-10-CM | POA: Diagnosis not present

## 2014-05-15 DIAGNOSIS — Z96641 Presence of right artificial hip joint: Secondary | ICD-10-CM | POA: Diagnosis present

## 2014-05-15 DIAGNOSIS — D62 Acute posthemorrhagic anemia: Secondary | ICD-10-CM | POA: Diagnosis not present

## 2014-05-15 DIAGNOSIS — Z79899 Other long term (current) drug therapy: Secondary | ICD-10-CM | POA: Diagnosis not present

## 2014-05-15 DIAGNOSIS — J45909 Unspecified asthma, uncomplicated: Secondary | ICD-10-CM | POA: Diagnosis present

## 2014-05-15 DIAGNOSIS — D693 Immune thrombocytopenic purpura: Secondary | ICD-10-CM | POA: Diagnosis present

## 2014-05-15 DIAGNOSIS — D696 Thrombocytopenia, unspecified: Secondary | ICD-10-CM | POA: Diagnosis present

## 2014-05-15 DIAGNOSIS — Z7982 Long term (current) use of aspirin: Secondary | ICD-10-CM

## 2014-05-15 DIAGNOSIS — E669 Obesity, unspecified: Secondary | ICD-10-CM | POA: Diagnosis present

## 2014-05-15 HISTORY — PX: TOTAL HIP ARTHROPLASTY: SHX124

## 2014-05-15 LAB — CBC
HCT: 34.6 % — ABNORMAL LOW (ref 36.0–46.0)
Hemoglobin: 12.2 g/dL (ref 12.0–15.0)
MCH: 29.9 pg (ref 26.0–34.0)
MCHC: 35.3 g/dL (ref 30.0–36.0)
MCV: 84.8 fL (ref 78.0–100.0)
Platelets: 221 10*3/uL (ref 150–400)
RBC: 4.08 MIL/uL (ref 3.87–5.11)
RDW: 14.3 % (ref 11.5–15.5)
WBC: 11.7 10*3/uL — AB (ref 4.0–10.5)

## 2014-05-15 LAB — PREPARE RBC (CROSSMATCH)

## 2014-05-15 SURGERY — ARTHROPLASTY, HIP, TOTAL, ANTERIOR APPROACH
Anesthesia: General | Laterality: Left

## 2014-05-15 MED ORDER — ONDANSETRON HCL 4 MG/2ML IJ SOLN
INTRAMUSCULAR | Status: AC
  Filled 2014-05-15: qty 2

## 2014-05-15 MED ORDER — 0.9 % SODIUM CHLORIDE (POUR BTL) OPTIME
TOPICAL | Status: DC | PRN
  Administered 2014-05-15: 1000 mL

## 2014-05-15 MED ORDER — PROPOFOL 10 MG/ML IV BOLUS
INTRAVENOUS | Status: DC | PRN
  Administered 2014-05-15: 170 mg via INTRAVENOUS

## 2014-05-15 MED ORDER — ACETAMINOPHEN 650 MG RE SUPP: 650.0000 mg | Freq: Four times a day (QID) | RECTAL | Status: DC | PRN

## 2014-05-15 MED ORDER — ZOLPIDEM TARTRATE 5 MG PO TABS: 5.0000 mg | ORAL_TABLET | Freq: Every evening | ORAL | Status: DC | PRN

## 2014-05-15 MED ORDER — OXYCODONE-ACETAMINOPHEN 5-325 MG PO TABS: 1.0000 | ORAL_TABLET | Freq: Four times a day (QID) | ORAL | Status: DC | PRN

## 2014-05-15 MED ORDER — ONDANSETRON HCL 4 MG/2ML IJ SOLN: 4.0000 mg | Freq: Four times a day (QID) | INTRAMUSCULAR | Status: DC | PRN

## 2014-05-15 MED ORDER — ROCURONIUM BROMIDE 50 MG/5ML IV SOLN
INTRAVENOUS | Status: AC
  Filled 2014-05-15: qty 1

## 2014-05-15 MED ORDER — HYDROMORPHONE HCL 1 MG/ML IJ SOLN
INTRAMUSCULAR | Status: AC
  Filled 2014-05-15: qty 1

## 2014-05-15 MED ORDER — OXYCODONE-ACETAMINOPHEN 5-325 MG PO TABS
1.0000 | ORAL_TABLET | ORAL | Status: DC | PRN
  Administered 2014-05-15 – 2014-05-17 (×6): 2 via ORAL
  Filled 2014-05-15 (×6): qty 2

## 2014-05-15 MED ORDER — ONDANSETRON HCL 4 MG/2ML IJ SOLN
INTRAMUSCULAR | Status: DC | PRN
  Administered 2014-05-15: 4 mg via INTRAVENOUS

## 2014-05-15 MED ORDER — LIDOCAINE HCL (CARDIAC) 20 MG/ML IV SOLN
INTRAVENOUS | Status: DC | PRN
  Administered 2014-05-15: 70 mg via INTRAVENOUS

## 2014-05-15 MED ORDER — ONDANSETRON HCL 4 MG PO TABS
4.0000 mg | ORAL_TABLET | Freq: Four times a day (QID) | ORAL | Status: DC | PRN
Start: 2014-05-15 — End: 2014-05-17

## 2014-05-15 MED ORDER — BUPIVACAINE LIPOSOME 1.3 % IJ SUSP
20.0000 mL | INTRAMUSCULAR | Status: DC
  Filled 2014-05-15: qty 20

## 2014-05-15 MED ORDER — DOCUSATE SODIUM 100 MG PO CAPS
100.0000 mg | ORAL_CAPSULE | Freq: Two times a day (BID) | ORAL | Status: DC
  Administered 2014-05-15 – 2014-05-17 (×4): 100 mg via ORAL
  Filled 2014-05-15 (×5): qty 1

## 2014-05-15 MED ORDER — LORATADINE 10 MG PO TABS
10.0000 mg | ORAL_TABLET | Freq: Every day | ORAL | Status: DC
  Administered 2014-05-16: 10 mg via ORAL
  Filled 2014-05-15 (×3): qty 1

## 2014-05-15 MED ORDER — TRANEXAMIC ACID 100 MG/ML IV SOLN
1000.0000 mg | INTRAVENOUS | Status: AC
  Administered 2014-05-15: 1000 mg via INTRAVENOUS
  Filled 2014-05-15: qty 10

## 2014-05-15 MED ORDER — ROCURONIUM BROMIDE 100 MG/10ML IV SOLN
INTRAVENOUS | Status: DC | PRN
  Administered 2014-05-15: 10 mg via INTRAVENOUS
  Administered 2014-05-15: 5 mg via INTRAVENOUS
  Administered 2014-05-15: 50 mg via INTRAVENOUS
  Administered 2014-05-15 (×2): 10 mg via INTRAVENOUS

## 2014-05-15 MED ORDER — HYDROMORPHONE HCL 1 MG/ML IJ SOLN
0.2500 mg | INTRAMUSCULAR | Status: DC | PRN
  Administered 2014-05-15 (×6): 0.5 mg via INTRAVENOUS

## 2014-05-15 MED ORDER — PREDNISONE 10 MG PO TABS
10.0000 mg | ORAL_TABLET | Freq: Every day | ORAL | Status: DC
  Administered 2014-05-16 – 2014-05-17 (×2): 10 mg via ORAL
  Filled 2014-05-15 (×3): qty 1

## 2014-05-15 MED ORDER — LACTATED RINGERS IV SOLN
INTRAVENOUS | Status: DC | PRN
  Administered 2014-05-15 (×3): via INTRAVENOUS

## 2014-05-15 MED ORDER — PROMETHAZINE HCL 25 MG/ML IJ SOLN: 12.5000 mg | Freq: Four times a day (QID) | INTRAMUSCULAR | Status: DC | PRN

## 2014-05-15 MED ORDER — BUPIVACAINE LIPOSOME 1.3 % IJ SUSP
INTRAMUSCULAR | Status: DC | PRN
  Administered 2014-05-15: 20 mL

## 2014-05-15 MED ORDER — PHENYLEPHRINE HCL 10 MG/ML IJ SOLN
INTRAMUSCULAR | Status: DC | PRN
  Administered 2014-05-15 (×3): 40 ug via INTRAVENOUS

## 2014-05-15 MED ORDER — METHOCARBAMOL 1000 MG/10ML IJ SOLN
500.0000 mg | Freq: Four times a day (QID) | INTRAVENOUS | Status: DC | PRN
  Filled 2014-05-15: qty 5

## 2014-05-15 MED ORDER — FENTANYL CITRATE 0.05 MG/ML IJ SOLN
INTRAMUSCULAR | Status: AC
  Filled 2014-05-15: qty 5

## 2014-05-15 MED ORDER — SODIUM CHLORIDE 0.9 % IV SOLN
INTRAVENOUS | Status: DC
  Administered 2014-05-16: 06:00:00 via INTRAVENOUS

## 2014-05-15 MED ORDER — BUPIVACAINE HCL 0.5 % IJ SOLN
INTRAMUSCULAR | Status: DC | PRN
  Administered 2014-05-15: 20 mL

## 2014-05-15 MED ORDER — POLYETHYLENE GLYCOL 3350 17 G PO PACK
17.0000 g | PACK | Freq: Every day | ORAL | Status: DC | PRN
  Administered 2014-05-17: 17 g via ORAL
  Filled 2014-05-15: qty 1

## 2014-05-15 MED ORDER — ARTIFICIAL TEARS OP OINT
TOPICAL_OINTMENT | OPHTHALMIC | Status: AC
  Filled 2014-05-15: qty 3.5

## 2014-05-15 MED ORDER — GLYCOPYRROLATE 0.2 MG/ML IJ SOLN
INTRAMUSCULAR | Status: DC | PRN
  Administered 2014-05-15: .8 mg via INTRAVENOUS

## 2014-05-15 MED ORDER — MIDAZOLAM HCL 5 MG/5ML IJ SOLN
INTRAMUSCULAR | Status: DC | PRN
  Administered 2014-05-15: 1 mg via INTRAVENOUS

## 2014-05-15 MED ORDER — LACTATED RINGERS IV SOLN
INTRAVENOUS | Status: DC
  Administered 2014-05-15: 11:00:00 via INTRAVENOUS

## 2014-05-15 MED ORDER — PROPOFOL 10 MG/ML IV BOLUS
INTRAVENOUS | Status: AC
  Filled 2014-05-15: qty 20

## 2014-05-15 MED ORDER — OXYCODONE HCL 5 MG PO TABS
5.0000 mg | ORAL_TABLET | Freq: Once | ORAL | Status: AC
Start: 2014-05-15 — End: 2014-05-15
  Administered 2014-05-15: 5 mg via ORAL

## 2014-05-15 MED ORDER — MIDAZOLAM HCL 2 MG/2ML IJ SOLN
INTRAMUSCULAR | Status: AC
  Filled 2014-05-15: qty 2

## 2014-05-15 MED ORDER — ACETAMINOPHEN 325 MG PO TABS: 650.0000 mg | ORAL_TABLET | Freq: Four times a day (QID) | ORAL | Status: DC | PRN

## 2014-05-15 MED ORDER — ASPIRIN EC 325 MG PO TBEC
325.0000 mg | DELAYED_RELEASE_TABLET | Freq: Two times a day (BID) | ORAL | Status: DC
  Administered 2014-05-16 – 2014-05-17 (×3): 325 mg via ORAL
  Filled 2014-05-15 (×6): qty 1

## 2014-05-15 MED ORDER — PROMETHAZINE HCL 25 MG/ML IJ SOLN: 6.2500 mg | INTRAMUSCULAR | Status: DC | PRN

## 2014-05-15 MED ORDER — NEOSTIGMINE METHYLSULFATE 10 MG/10ML IV SOLN
INTRAVENOUS | Status: DC | PRN
  Administered 2014-05-15: 5 mg via INTRAVENOUS

## 2014-05-15 MED ORDER — ALUM & MAG HYDROXIDE-SIMETH 200-200-20 MG/5ML PO SUSP: 30.0000 mL | ORAL | Status: DC | PRN

## 2014-05-15 MED ORDER — ASPIRIN EC 325 MG PO TBEC: 325.0000 mg | DELAYED_RELEASE_TABLET | Freq: Two times a day (BID) | ORAL | Status: DC

## 2014-05-15 MED ORDER — FUROSEMIDE 20 MG PO TABS
20.0000 mg | ORAL_TABLET | Freq: Every day | ORAL | Status: DC | PRN
  Filled 2014-05-15: qty 1

## 2014-05-15 MED ORDER — GLYCOPYRROLATE 0.2 MG/ML IJ SOLN
INTRAMUSCULAR | Status: AC
  Filled 2014-05-15: qty 2

## 2014-05-15 MED ORDER — HYDROMORPHONE HCL 1 MG/ML IJ SOLN: 0.5000 mg | Freq: Once | INTRAMUSCULAR | Status: DC

## 2014-05-15 MED ORDER — FENTANYL CITRATE 0.05 MG/ML IJ SOLN
INTRAMUSCULAR | Status: DC | PRN
  Administered 2014-05-15 (×8): 50 ug via INTRAVENOUS

## 2014-05-15 MED ORDER — BISACODYL 5 MG PO TBEC: 5.0000 mg | DELAYED_RELEASE_TABLET | Freq: Every day | ORAL | Status: DC | PRN

## 2014-05-15 MED ORDER — METHOCARBAMOL 500 MG PO TABS
500.0000 mg | ORAL_TABLET | Freq: Four times a day (QID) | ORAL | Status: DC | PRN
  Administered 2014-05-15 – 2014-05-17 (×5): 500 mg via ORAL
  Filled 2014-05-15 (×5): qty 1

## 2014-05-15 MED ORDER — METHOCARBAMOL 750 MG PO TABS: 750.0000 mg | ORAL_TABLET | Freq: Three times a day (TID) | ORAL | Status: DC | PRN

## 2014-05-15 MED ORDER — DIPHENHYDRAMINE HCL 12.5 MG/5ML PO ELIX
12.5000 mg | ORAL_SOLUTION | ORAL | Status: DC | PRN
  Administered 2014-05-16: 25 mg via ORAL
  Filled 2014-05-15: qty 10

## 2014-05-15 MED ORDER — MENTHOL 3 MG MT LOZG
1.0000 | LOZENGE | OROMUCOSAL | Status: DC | PRN
  Administered 2014-05-16: 3 mg via ORAL
  Filled 2014-05-15 (×2): qty 9

## 2014-05-15 MED ORDER — CEFAZOLIN SODIUM-DEXTROSE 2-3 GM-% IV SOLR
2.0000 g | Freq: Four times a day (QID) | INTRAVENOUS | Status: AC
  Administered 2014-05-15 – 2014-05-16 (×2): 2 g via INTRAVENOUS
  Filled 2014-05-15 (×2): qty 50

## 2014-05-15 MED ORDER — SODIUM CHLORIDE 0.9 % IV SOLN: Freq: Once | INTRAVENOUS | Status: DC

## 2014-05-15 MED ORDER — HYDROMORPHONE HCL 1 MG/ML IJ SOLN: 1.0000 mg | INTRAMUSCULAR | Status: DC | PRN

## 2014-05-15 MED ORDER — METHOCARBAMOL 500 MG PO TABS
ORAL_TABLET | ORAL | Status: AC
  Filled 2014-05-15: qty 1

## 2014-05-15 MED ORDER — OXYCODONE HCL 5 MG PO TABS
ORAL_TABLET | ORAL | Status: AC
  Filled 2014-05-15: qty 1

## 2014-05-15 MED ORDER — PHENOL 1.4 % MT LIQD: 1.0000 | OROMUCOSAL | Status: DC | PRN

## 2014-05-15 SURGICAL SUPPLY — 54 items
BENZOIN TINCTURE PRP APPL 2/3 (GAUZE/BANDAGES/DRESSINGS) ×2 IMPLANT
BLADE SAW SGTL 18X1.27X75 (BLADE) ×2 IMPLANT
BLADE SURG ROTATE 9660 (MISCELLANEOUS) IMPLANT
BNDG COHESIVE 6X5 TAN STRL LF (GAUZE/BANDAGES/DRESSINGS) IMPLANT
BNDG GAUZE ELAST 4 BULKY (GAUZE/BANDAGES/DRESSINGS) IMPLANT
CAPT HIP PF COP ×2 IMPLANT
CELLS DAT CNTRL 66122 CELL SVR (MISCELLANEOUS) ×1 IMPLANT
CLSR STERI-STRIP ANTIMIC 1/2X4 (GAUZE/BANDAGES/DRESSINGS) ×2 IMPLANT
COVER SURGICAL LIGHT HANDLE (MISCELLANEOUS) ×2 IMPLANT
DRAPE C-ARM 42X72 X-RAY (DRAPES) ×2 IMPLANT
DRAPE STERI IOBAN 125X83 (DRAPES) ×2 IMPLANT
DRAPE U-SHAPE 47X51 STRL (DRAPES) ×6 IMPLANT
DRSG MEPILEX BORDER 4X8 (GAUZE/BANDAGES/DRESSINGS) ×2 IMPLANT
DURAPREP 26ML APPLICATOR (WOUND CARE) ×2 IMPLANT
ELECT BLADE 4.0 EZ CLEAN MEGAD (MISCELLANEOUS)
ELECT CAUTERY BLADE 6.4 (BLADE) ×2 IMPLANT
ELECT REM PT RETURN 9FT ADLT (ELECTROSURGICAL) ×2
ELECTRODE BLDE 4.0 EZ CLN MEGD (MISCELLANEOUS) IMPLANT
ELECTRODE REM PT RTRN 9FT ADLT (ELECTROSURGICAL) ×1 IMPLANT
GAUZE VASELINE 1X8 (GAUZE/BANDAGES/DRESSINGS) ×2 IMPLANT
GAUZE XEROFORM 1X8 LF (GAUZE/BANDAGES/DRESSINGS) ×2 IMPLANT
GLOVE BIOGEL M 7.0 STRL (GLOVE) ×2 IMPLANT
GLOVE BIOGEL PI IND STRL 7.5 (GLOVE) ×1 IMPLANT
GLOVE BIOGEL PI IND STRL 8 (GLOVE) ×2 IMPLANT
GLOVE BIOGEL PI INDICATOR 7.5 (GLOVE) ×1
GLOVE BIOGEL PI INDICATOR 8 (GLOVE) ×2
GLOVE ECLIPSE 7.5 STRL STRAW (GLOVE) ×10 IMPLANT
GOWN STRL REUS W/ TWL LRG LVL3 (GOWN DISPOSABLE) ×2 IMPLANT
GOWN STRL REUS W/ TWL XL LVL3 (GOWN DISPOSABLE) ×2 IMPLANT
GOWN STRL REUS W/TWL 2XL LVL3 (GOWN DISPOSABLE) ×2 IMPLANT
GOWN STRL REUS W/TWL LRG LVL3 (GOWN DISPOSABLE) ×2
GOWN STRL REUS W/TWL XL LVL3 (GOWN DISPOSABLE) ×2
HOOD PEEL AWAY FACE SHEILD DIS (HOOD) ×4 IMPLANT
KIT BASIN OR (CUSTOM PROCEDURE TRAY) ×2 IMPLANT
KIT ROOM TURNOVER OR (KITS) ×2 IMPLANT
MANIFOLD NEPTUNE II (INSTRUMENTS) ×2 IMPLANT
NS IRRIG 1000ML POUR BTL (IV SOLUTION) ×2 IMPLANT
PACK TOTAL JOINT (CUSTOM PROCEDURE TRAY) ×2 IMPLANT
PAD ARMBOARD 7.5X6 YLW CONV (MISCELLANEOUS) ×4 IMPLANT
RTRCTR WOUND ALEXIS 18CM MED (MISCELLANEOUS) ×2
SPONGE LAP 18X18 X RAY DECT (DISPOSABLE) IMPLANT
STAPLER VISISTAT 35W (STAPLE) IMPLANT
SUT ETHIBOND NAB CT1 #1 30IN (SUTURE) ×4 IMPLANT
SUT MNCRL AB 3-0 PS2 18 (SUTURE) IMPLANT
SUT VIC AB 0 CT1 27 (SUTURE) ×1
SUT VIC AB 0 CT1 27XBRD ANBCTR (SUTURE) ×1 IMPLANT
SUT VIC AB 1 CT1 27 (SUTURE) ×4
SUT VIC AB 1 CT1 27XBRD ANBCTR (SUTURE) ×4 IMPLANT
SUT VIC AB 2-0 CT1 27 (SUTURE) ×1
SUT VIC AB 2-0 CT1 TAPERPNT 27 (SUTURE) ×1 IMPLANT
TOWEL OR 17X24 6PK STRL BLUE (TOWEL DISPOSABLE) ×2 IMPLANT
TOWEL OR 17X26 10 PK STRL BLUE (TOWEL DISPOSABLE) ×2 IMPLANT
TRAY FOLEY CATH 16FR SILVER (SET/KITS/TRAYS/PACK) ×2 IMPLANT
WATER STERILE IRR 1000ML POUR (IV SOLUTION) IMPLANT

## 2014-05-15 NOTE — H&P (Signed)
TOTAL HIP ADMISSION H&P  Patient is admitted for left total hip arthroplasty.  Subjective:  Chief Complaint: left hip pain  HPI: Victoria Morgan, 58 y.o. female, has a history of pain and functional disability in the left hip(s) due to arthritis and patient has failed non-surgical conservative treatments for greater than 12 weeks to include NSAID's and/or analgesics, corticosteriod injections, flexibility and strengthening excercises, supervised PT with diminished ADL's post treatment, use of assistive devices, weight reduction as appropriate and activity modification.  Onset of symptoms was gradual starting 8 years ago with gradually worsening course since that time.The patient noted no past surgery on the left hip(s).  Patient currently rates pain in the left hip at 8 out of 10 with activity. Patient has night pain, worsening of pain with activity and weight bearing, trendelenberg gait, pain that interfers with activities of daily living, pain with passive range of motion, crepitus and joint swelling. Patient has evidence of subchondral sclerosis, periarticular osteophytes and joint space narrowing by imaging studies. This condition presents safety issues increasing the risk of falls. This patient has had failure of all reasonable conservative care..  There is no current active infection.The patient has a significant history of low platelet count.  She had a discussion with her hematologist who approved her surgery last time.  She had a significant blood loss around the time of her last surgery.  The patient is very much understanding of the concerns of her platelet count and blood loss related to surgery.  Patient Active Problem List   Diagnosis Date Noted  . Osteoarthritis of right hip 11/28/2013  . Acute blood loss anemia 11/28/2013  . Thrombocytopenia, unspecified 11/28/2013   Past Medical History  Diagnosis Date  . Asthma   . Blood dyscrasia     ITP   dx in 1990  . Arthritis      Past Surgical History  Procedure Laterality Date  . Spleenectomy      x 2  . Tubal ligation    . Ctr      right hand  . Total hip arthroplasty Right 11/28/2013    Procedure: TOTAL HIP ARTHROPLASTY ANTERIOR APPROACH;  Surgeon: Alta Corning, MD;  Location: [facility];  Service: Orthopedics;  Laterality: Right;  Right total hip arthroplasty anterior approach    Prescriptions prior to admission  Medication Sig Dispense Refill  . acetaminophen (TYLENOL ARTHRITIS PAIN) 650 MG CR tablet Take 650 mg by mouth 2 (two) times daily as needed for pain.      . Calcium Carbonate-Vitamin D (CALCIUM + D PO) Take 1 tablet by mouth daily.      . cetirizine (ZYRTEC) 10 MG tablet Take 10 mg by mouth daily as needed for allergies.       . furosemide (LASIX) 20 MG tablet Take 20 mg by mouth daily as needed for fluid (Ankle swelling).      Marland Kitchen GARCINIA CAMBOGIA-CHROMIUM PO Take 1 tablet by mouth daily.      . Melatonin 10 MG CAPS Take 10 mg by mouth at bedtime as needed.       . meloxicam (MOBIC) 15 MG tablet Take 15 mg by mouth daily.      . Multiple Vitamin (MULTIVITAMIN WITH MINERALS) TABS tablet Take 1 tablet by mouth daily.      . Naphazoline HCl (CLEAR EYES OP) Place 1 drop into both eyes daily.       Allergies  Allergen Reactions  . Aspirin     History of low  platelets  . Citrus     itch    History  Substance Use Topics  . Smoking status: Never Smoker   . Smokeless tobacco: Never Used  . Alcohol Use: Yes     Comment: occasionally    History reviewed. No pertinent family history.   ROS ROS: I have reviewed the patient's review of systems thoroughly and there are no positive responses as relates to the HPI. Objective:  Physical Exam  Vital signs in last 24 hours: Temp:  [98.3 F (36.8 C)] 98.3 F (36.8 C) (10/05 1049) Pulse Rate:  [66] 66 (10/05 1049) Resp:  [20] 20 (10/05 1049) BP: (138)/(58) 138/58 mmHg (10/05 1049) SpO2:  [99 %] 99 % (10/05 1049) Weight:  [228 lb (103.42 kg)] 228 lb  (103.42 kg) (10/05 1049) Well-developed well-nourished patient in no acute distress. Alert and oriented x3 HEENT:within normal limits Cardiac: Regular rate and rhythm Pulmonary: Lungs clear to auscultation Abdomen: Soft and nontender.  Normal active bowel sounds  Musculoskeletal: Left hip: Painful range of motion.  Limited range of motion.  No instability. Labs: Recent Results (from the past 2160 hour(s))  TYPE AND SCREEN     Status: None   Collection Time    05/02/14  9:44 AM      Result Value Ref Range   ABO/RH(D) O POS     Antibody Screen NEG     Sample Expiration 05/16/2014    APTT     Status: None   Collection Time    05/02/14  9:47 AM      Result Value Ref Range   aPTT 30  24 - 37 seconds  CBC WITH DIFFERENTIAL     Status: Abnormal   Collection Time    05/02/14  9:47 AM      Result Value Ref Range   WBC 6.2  4.0 - 10.5 K/uL   RBC 3.86 (*) 3.87 - 5.11 MIL/uL   Hemoglobin 11.5 (*) 12.0 - 15.0 g/dL   HCT 33.7 (*) 36.0 - 46.0 %   MCV 87.3  78.0 - 100.0 fL   MCH 29.8  26.0 - 34.0 pg   MCHC 34.1  30.0 - 36.0 g/dL   RDW 14.6  11.5 - 15.5 %   Platelets 67 (*) 150 - 400 K/uL   Comment: CONSISTENT WITH PREVIOUS RESULT   Neutrophils Relative % 60  43 - 77 %   Neutro Abs 3.7  1.7 - 7.7 K/uL   Lymphocytes Relative 26  12 - 46 %   Lymphs Abs 1.6  0.7 - 4.0 K/uL   Monocytes Relative 9  3 - 12 %   Monocytes Absolute 0.6  0.1 - 1.0 K/uL   Eosinophils Relative 4  0 - 5 %   Eosinophils Absolute 0.2  0.0 - 0.7 K/uL   Basophils Relative 1  0 - 1 %   Basophils Absolute 0.0  0.0 - 0.1 K/uL  COMPREHENSIVE METABOLIC PANEL     Status: Abnormal   Collection Time    05/02/14  9:47 AM      Result Value Ref Range   Sodium 139  137 - 147 mEq/L   Potassium 4.0  3.7 - 5.3 mEq/L   Chloride 103  96 - 112 mEq/L   CO2 27  19 - 32 mEq/L   Glucose, Bld 83  70 - 99 mg/dL   BUN 18  6 - 23 mg/dL   Creatinine, Ser 1.14 (*) 0.50 - 1.10 mg/dL   Calcium 9.9  8.4 - 10.5 mg/dL   Total Protein 7.2   6.0 - 8.3 g/dL   Albumin 4.1  3.5 - 5.2 g/dL   AST 29  0 - 37 U/L   ALT 26  0 - 35 U/L   Alkaline Phosphatase 97  39 - 117 U/L   Total Bilirubin 0.4  0.3 - 1.2 mg/dL   GFR calc non Af Amer 53 (*) >90 mL/min   GFR calc Af Amer 62 (*) >90 mL/min   Comment: (NOTE)     The eGFR has been calculated using the CKD EPI equation.     This calculation has not been validated in all clinical situations.     eGFR's persistently <90 mL/min signify possible Chronic Kidney     Disease.   Anion gap 9  5 - 15  PROTIME-INR     Status: None   Collection Time    05/02/14  9:47 AM      Result Value Ref Range   Prothrombin Time 13.2  11.6 - 15.2 seconds   INR 1.00  0.00 - 1.49  URINALYSIS, ROUTINE W REFLEX MICROSCOPIC     Status: None   Collection Time    05/02/14  9:49 AM      Result Value Ref Range   Color, Urine YELLOW  YELLOW   APPearance CLEAR  CLEAR   Specific Gravity, Urine 1.014  1.005 - 1.030   pH 6.0  5.0 - 8.0   Glucose, UA NEGATIVE  NEGATIVE mg/dL   Hgb urine dipstick NEGATIVE  NEGATIVE   Bilirubin Urine NEGATIVE  NEGATIVE   Ketones, ur NEGATIVE  NEGATIVE mg/dL   Protein, ur NEGATIVE  NEGATIVE mg/dL   Urobilinogen, UA 0.2  0.0 - 1.0 mg/dL   Nitrite NEGATIVE  NEGATIVE   Leukocytes, UA NEGATIVE  NEGATIVE   Comment: MICROSCOPIC NOT DONE ON URINES WITH NEGATIVE PROTEIN, BLOOD, LEUKOCYTES, NITRITE, OR GLUCOSE <1000 mg/dL.  SURGICAL PCR SCREEN     Status: None   Collection Time    05/02/14  9:50 AM      Result Value Ref Range   MRSA, PCR NEGATIVE  NEGATIVE   Staphylococcus aureus NEGATIVE  NEGATIVE   Comment:            The Xpert SA Assay (FDA     approved for NASAL specimens     in patients over 73 years of age),     is one component of     a comprehensive surveillance     program.  Test performance has     been validated by [employer] for patients greater     than or equal to 72 year old.     It is not intended     to diagnose infection nor to     guide or monitor  treatment.  CBC     Status: Abnormal   Collection Time    05/15/14 10:46 AM      Result Value Ref Range   WBC 11.7 (*) 4.0 - 10.5 K/uL   RBC 4.08  3.87 - 5.11 MIL/uL   Hemoglobin 12.2  12.0 - 15.0 g/dL   HCT 34.6 (*) 36.0 - 46.0 %   MCV 84.8  78.0 - 100.0 fL   MCH 29.9  26.0 - 34.0 pg   MCHC 35.3  30.0 - 36.0 g/dL   RDW 14.3  11.5 - 15.5 %   Platelets 221  150 - 400 K/uL  Estimated body mass index is 37.94 kg/(m^2) as calculated from the following:   Height as of 05/02/14: 5' 5" (1.651 m).   Weight as of this encounter: 228 lb (103.42 kg).   Imaging Review Plain radiographs demonstrate severe degenerative joint disease of the left hip(s). The bone quality appears to be good for age and reported activity level.  Assessment/Plan:  End stage arthritis, left hip(s)  The patient history, physical examination, clinical judgement of the provider and imaging studies are consistent with end stage degenerative joint disease of the left hip(s) and total hip arthroplasty is deemed medically necessary. The treatment options including medical management, injection therapy, arthroscopy and arthroplasty were discussed at length. The risks and benefits of total hip arthroplasty were presented and reviewed. The risks due to aseptic loosening, infection, stiffness, dislocation/subluxation,  thromboembolic complications and other imponderables were discussed.  The patient acknowledged the explanation, agreed to proceed with the plan and consent was signed. Patient is being admitted for inpatient treatment for surgery, pain control, PT, OT, prophylactic antibiotics, VTE prophylaxis, progressive ambulation and ADL's and discharge planning.The patient is planning to be discharged home with home health services

## 2014-05-15 NOTE — Brief Op Note (Signed)
05/15/2014  2:51 PM  PATIENT:  Victoria Morgan  58 y.o. female  PRE-OPERATIVE DIAGNOSIS:  DEGENERATIVE JOINT DISEASE LEFT HIP  POST-OPERATIVE DIAGNOSIS:  DEGENERATIVE JOINT DISEASE LEFT HIP  PROCEDURE:  Procedure(s): TOTAL HIP ARTHROPLASTY ANTERIOR APPROACH (Left)  SURGEON:  Surgeon(s) and Role:    * Harvie JuniorJohn L Chaston Bradburn, MD - Primary  PHYSICIAN ASSISTANT:   ASSISTANTS: bethune   ANESTHESIA:   general  EBL:  Total I/O In: 2500 [I.V.:2500] Out: 2000 [Urine:1400; Blood:600]  BLOOD ADMINISTERED:none  DRAINS: none   LOCAL MEDICATIONS USED:  OTHER experel  SPECIMEN:  No Specimen  DISPOSITION OF SPECIMEN:  N/A  COUNTS:  YES  TOURNIQUET:  * No tourniquets in log *  DICTATION: .Other Dictation: Dictation Number [id number]  PLAN OF CARE: Admit to inpatient   PATIENT DISPOSITION:  PACU - hemodynamically stable.   Delay start of Pharmacological VTE agent (>24hrs) due to surgical blood loss or risk of bleeding: no

## 2014-05-15 NOTE — Discharge Instructions (Signed)
Total Hip Replacement, Care After °Refer to this sheet in the next few weeks. These instructions provide you with information on caring for yourself after your procedure. Your health care provider may also give you specific instructions. Your treatment has been planned according to the most current medical practices, but problems sometimes occur. Call your health care provider if you have any problems or questions after your procedure. °HOME CARE INSTRUCTIONS  °Your health care provider will give you specific precautions for certain types of movement. Additional instructions include: °· Take medicines only as directed by your health care provider. °· Take quick showers (3-5 min) rather than bathe until your health care provider tells you that you can take baths again. °· Avoid lifting until your health care provider instructs you otherwise. °· Use a raised toilet seat and avoid sitting in low chairs as instructed by your health care provider. °· Use crutches or a walker as instructed by your health care provider. °SEEK MEDICAL CARE IF: °· You have difficulty breathing. °· You have drainage, redness, or swelling at your incision site. °· You have a bad smell coming from your incision site. °· You have persistent bleeding from your incision site. °· Your incision breaks open after sutures (stitches) or staples have been removed. °· You have a fever. °SEEK IMMEDIATE MEDICAL CARE IF:  °· You have a rash. °· You have pain or swelling in your calf or thigh. °· You have shortness of breath or chest pain. °MAKE SURE YOU: °· Understand these instructions. °· Will watch your condition. °· Will get help if you are not doing well or get worse. °Document Released: 02/14/2005 Document Revised: 12/12/2013 Document Reviewed: 09/28/2013 °[employer]® Patient Information ©2015 [employer]. This information is not intended to replace advice given to you by your health care provider. Make sure you discuss any questions you have with  your health care provider. ° °

## 2014-05-15 NOTE — Transfer of Care (Signed)
Immediate Anesthesia Transfer of Care Note  Patient: Victoria Morgan  Procedure(s) Performed: Procedure(s): TOTAL HIP ARTHROPLASTY ANTERIOR APPROACH (Left)  Patient Location: PACU  Anesthesia Type:General  Level of Consciousness: awake, alert  and oriented  Airway & Oxygen Therapy: Patient Spontanous Breathing and Patient connected to face mask oxygen  Post-op Assessment: Report given to PACU RN  Post vital signs: Reviewed and stable  Complications: No apparent anesthesia complications

## 2014-05-15 NOTE — Anesthesia Preprocedure Evaluation (Addendum)
Anesthesia Evaluation  Patient identified by MRN, date of birth, ID band Patient awake    Reviewed: Allergy & Precautions, H&P , NPO status , Patient's Chart, lab work & pertinent test results  Airway Mallampati: II TM Distance: >3 FB Neck ROM: Full    Dental no notable dental hx.    Pulmonary asthma ,  breath sounds clear to auscultation  Pulmonary exam normal       Cardiovascular negative cardio ROS  Rhythm:Regular Rate:Normal     Neuro/Psych negative neurological ROS  negative psych ROS   GI/Hepatic negative GI ROS, Neg liver ROS,   Endo/Other  negative endocrine ROS  Renal/GU negative Renal ROS  negative genitourinary   Musculoskeletal  (+) Arthritis -,   Abdominal (+) + obese,   Peds negative pediatric ROS (+)  Hematology  (+) Blood dyscrasia, anemia , H/O ITP s/p splenectomy with continuing thrombocytopenia.  Platelets 221K today after prednisone dose pack started last Tuesday.   Anesthesia Other Findings   Reproductive/Obstetrics negative OB ROS                          Anesthesia Physical Anesthesia Plan  ASA: III  Anesthesia Plan: General   Post-op Pain Management:    Induction: Intravenous  Airway Management Planned: Oral ETT  Additional Equipment:   Intra-op Plan:   Post-operative Plan: Extubation in OR  Informed Consent: I have reviewed the patients History and Physical, chart, labs and discussed the procedure including the risks, benefits and alternatives for the proposed anesthesia with the patient or authorized representative who has indicated his/her understanding and acceptance.   Dental advisory given  Plan Discussed with: CRNA  Anesthesia Plan Comments: (Required blood transfusion after last THA. May require blood and platelet transfusion today.)        Anesthesia Quick Evaluation

## 2014-05-16 ENCOUNTER — Encounter (HOSPITAL_COMMUNITY): Payer: Self-pay | Admitting: Orthopedic Surgery

## 2014-05-16 LAB — BASIC METABOLIC PANEL
Anion gap: 10 (ref 5–15)
BUN: 11 mg/dL (ref 6–23)
CHLORIDE: 106 meq/L (ref 96–112)
CO2: 27 meq/L (ref 19–32)
Calcium: 8.3 mg/dL — ABNORMAL LOW (ref 8.4–10.5)
Creatinine, Ser: 0.9 mg/dL (ref 0.50–1.10)
GFR calc Af Amer: 81 mL/min — ABNORMAL LOW (ref >90)
GFR calc non Af Amer: 70 mL/min — ABNORMAL LOW (ref >90)
Glucose, Bld: 86 mg/dL (ref 70–99)
Potassium: 3.7 mEq/L (ref 3.7–5.3)
Sodium: 143 mEq/L (ref 137–147)

## 2014-05-16 LAB — CBC
HEMATOCRIT: 28.1 % — AB (ref 36.0–46.0)
Hemoglobin: 9.8 g/dL — ABNORMAL LOW (ref 12.0–15.0)
MCH: 30.4 pg (ref 26.0–34.0)
MCHC: 34.9 g/dL (ref 30.0–36.0)
MCV: 87.3 fL (ref 78.0–100.0)
Platelets: 149 10*3/uL — ABNORMAL LOW (ref 150–400)
RBC: 3.22 MIL/uL — AB (ref 3.87–5.11)
RDW: 14.5 % (ref 11.5–15.5)
WBC: 10.2 10*3/uL (ref 4.0–10.5)

## 2014-05-16 MED ORDER — PREDNISONE 10 MG PO TABS
10.0000 mg | ORAL_TABLET | Freq: Every day | ORAL | Status: DC
Start: 2014-05-16 — End: 2015-03-21

## 2014-05-16 NOTE — Progress Notes (Signed)
Utilization review completed.  

## 2014-05-16 NOTE — Progress Notes (Signed)
Physical Therapy Treatment Patient Details Name: Victoria Morgan MRN: [id number] DOB: 1956-06-06 Today's Date: 05/16/2014    History of Present Illness pt presents post L THA and recent R THA in April 2015.      PT Comments    Pt continues to improve mobility and safety.  Husband present for session this pm and will be available to A pt at home.  Pt is ready for D/C from PT stand point.  Will continue to follow if remains on acute.    Follow Up Recommendations  Home health PT;Supervision - Intermittent     Equipment Recommendations  None recommended by PT    Recommendations for Other Services       Precautions / Restrictions Precautions Precautions: None Restrictions Weight Bearing Restrictions: Yes LLE Weight Bearing: Weight bearing as tolerated    Mobility  Bed Mobility               General bed mobility comments: pt sitting in recliner.    Transfers Overall transfer level: Needs assistance Equipment used: Rolling walker (2 wheeled) Transfers: Sit to/from Stand Sit to Stand: Supervision         General transfer comment: pt demos better technique this pm.    Ambulation/Gait Ambulation/Gait assistance: Min guard Ambulation Distance (Feet): 200 Feet Assistive device: Rolling walker (2 wheeled) Gait Pattern/deviations: Step-through pattern;Decreased stride length     General Gait Details: pt with improved ambulation this pm.  Able to perform step-through gait pattern.     Stairs Stairs: Yes Stairs assistance: Min guard Stair Management: Two rails;Step to pattern;Forwards Number of Stairs: 5 General stair comments: pt indicates front steps have Bil rails and she may use them instead of back steps.    Wheelchair Mobility    Modified Rankin (Stroke Patients Only)       Balance Overall balance assessment: No apparent balance deficits (not formally assessed)                                  Cognition  Arousal/Alertness: Awake/alert Behavior During Therapy: WFL for tasks assessed/performed Overall Cognitive Status: Within Functional Limits for tasks assessed                      Exercises      General Comments        Pertinent Vitals/Pain Pain Assessment: 0-10 Pain Score: 3  Pain Location: L Hip Pain Descriptors / Indicators: Aching Pain Intervention(s): Repositioned;Premedicated before session    Home Living                      Prior Function            PT Goals (current goals can now be found in the care plan section) Acute Rehab PT Goals Patient Stated Goal: Get back to water exercising.   PT Goal Formulation: With patient Time For Goal Achievement: 05/23/14 Potential to Achieve Goals: Good Progress towards PT goals: Progressing toward goals    Frequency  7X/week    PT Plan Current plan remains appropriate    Co-evaluation             End of Session Equipment Utilized During Treatment: Gait belt Activity Tolerance: Patient tolerated treatment well Patient left: in chair (in gym with OT.  )     Time: 1341-1400 PT Time Calculation (min): 19 min  Charges:  $Gait Training: 8-22 mins  G CodesSunny Schlein:      Addilyn Satterwhite F, South CarolinaPT [phone] 05/16/2014, 2:19 PM

## 2014-05-16 NOTE — Evaluation (Addendum)
Occupational Therapy Evaluation Patient Details Name: Victoria Morgan MRN: [id number] DOB: February 13, 1956 Today's Date: 05/16/2014    History of Present Illness pt presents post L THA and recent R THA in April 2015.     Clinical Impression   Pt at mod A level with LB ADLs and sup-min A level with ADL mobility. Pt's husband will provide assist/care at home.Pt had previous hip surgery in April 2015 and has ADL A/E kit at DME at home. All education completed and no further acute OT services indicated at this time. Pt to continue with acute PT services for functional mobility safety    Follow Up Recommendations  Supervision - Intermittent;No OT follow up    Equipment Recommendations  None recommended by OT    Recommendations for Other Services       Precautions / Restrictions Precautions Precautions: None Restrictions Weight Bearing Restrictions: Yes LLE Weight Bearing: Weight bearing as tolerated      Mobility Bed Mobility               General bed mobility comments: pt sitting in recliner.    Transfers Overall transfer level: Needs assistance Equipment used: Rolling walker (2 wheeled) Transfers: Sit to/from Stand Sit to Stand: Supervision         General transfer comment: pt demos better technique this pm.      Balance Overall balance assessment: No apparent balance deficits (not formally assessed)                                          ADL Overall ADL's : Needs assistance/impaired     Grooming: Wash/dry hands;Wash/dry face;Set up;Sitting;Supervision/safety;Standing   Upper Body Bathing: Set up;Sitting   Lower Body Bathing: Moderate assistance   Upper Body Dressing : Set up;Sitting   Lower Body Dressing: Moderate assistance   Toilet Transfer: Supervision/safety;RW;BSC   Toileting- Clothing Manipulation and Hygiene: Minimal assistance;Sit to/from stand   Tub/ Shower Transfer: Minimal assistance;Supervision/safety;Tub  bench   Functional mobility during ADLs: Supervision/safety General ADL Comments: pt has ADL A/E and DME at home from previous hip surgery April 2015     Vision  wears reading glasses                   Perception Perception Perception Tested?: No   Praxis Praxis Praxis tested?: Not tested    Pertinent Vitals/Pain Pain Assessment: 0-10 Pain Score: 4  Pain Location: L hip Pain Descriptors / Indicators: Aching;Sore Pain Intervention(s): Premedicated before session;Repositioned;Relaxation     Hand Dominance Right   Extremity/Trunk Assessment Upper Extremity Assessment Upper Extremity Assessment: Overall WFL for tasks assessed   Lower Extremity Assessment Lower Extremity Assessment: Defer to PT evaluation   Cervical / Trunk Assessment Cervical / Trunk Assessment: Normal   Communication Communication Communication: No difficulties   Cognition Arousal/Alertness: Awake/alert Behavior During Therapy: WFL for tasks assessed/performed Overall Cognitive Status: Within Functional Limits for tasks assessed                     General Comments   pt pleasant and cooperative                 Home Living Family/patient expects to be discharged to:: Private residence Living Arrangements: Spouse/significant other Available Help at Discharge: Family;Available 24 hours/day Type of Home: House Home Access: Stairs to enter [employer] of Steps: 6 Entrance Stairs-Rails: Right;Left Home  Layout: One level     Bathroom Shower/Tub: Teacher, early years/pre: Standard     Home Equipment: Cane - single point;Walker - 2 wheels;Toilet riser;Adaptive equipment Adaptive Equipment: Reacher;Sock aid;Long-handled shoe horn;Long-handled sponge Additional Comments: has ADL A/E kit form surgery April 2015      Prior Functioning/Environment Level of Independence: Independent             OT Diagnosis: Acute pain   OT Problem List:     OT  Treatment/Interventions:      OT Goals(Current goals can be found in the care plan section) Acute Rehab OT Goals Patient Stated Goal: Get back to water exercising.   OT Goal Formulation: With patient/family  OT Frequency:     Barriers to D/C:  none                        End of Session Equipment Utilized During Treatment: Rolling walker;Other (comment) (BSC, tub bench)  Activity Tolerance: Patient tolerated treatment well Patient left: with family/visitor present;in chair;with call bell/phone within reach   Time: 1355-1419 OT Time Calculation (min): 24 min Charges:  OT General Charges $OT Visit: 1 Procedure OT Evaluation $Initial OT Evaluation Tier I: 1 Procedure OT Treatments $Therapeutic Activity: 8-22 mins G-Codes:    Britt Bottom 05/16/2014, 2:35 PM

## 2014-05-16 NOTE — Op Note (Signed)
NAMEMILINDA, SWEENEY ACCOUNT NO.:  [id number]  MEDICAL RECORD NO.:  [id number]  LOCATION:  [id number]                        FACILITY:  [facility]  PHYSICIAN:  Harvie Junior, M.D.   DATE OF BIRTH:  17-Dec-1955  DATE OF PROCEDURE:  05/15/2014 DATE OF DISCHARGE:                              OPERATIVE REPORT   PREOPERATIVE DIAGNOSIS:  End-stage degenerative joint disease, left hip.  POSTOPERATIVE DIAGNOSIS:  End-stage degenerative joint disease, left hip.  PROCEDURE:  Left total hip replacement through an anterior approach with a Corail stem size 10, 50 mm Gription Pinnacle cup with a delta ceramic hip ball 32 mm, and a +4 neutral liner.  SURGEON:  Harvie Junior, MD.  ASSISTANT:  Marshia Ly, PA.  ANESTHESIA:  General.  BRIEF HISTORY:  Mr. Aldama is a 58 year old female, long history of severe bilateral degenerative joint disease of the hip. She had been treated with a right total hip replacement about 5 months ago and did great with that.  She came back for left total hip replacement and she was brought to the operating room for this procedure. Preoperatively, we discussed anterior versus posterior approach and felt that given her young age, that she would be suitable for an anterior approach.  She had the issue with low platelets and previously had a very large blood loss on her first surgery and at this point, we treated her preoperatively with some steroids to try to get her platelet up, got her up to about 220 preoperatively.  She was brought to the operating room for left anterior total hip replacement.  DESCRIPTION OF PROCEDURE:  The patient was taken to the operating room. After adequate anesthesia was achieved with general anesthetic, the patient was placed supine on the operating table.  She was then moved onto the Hana bed and preoperative x-rays were taken assessing leg length and position.  Once this was done, the left hip was prepped  and draped in usual sterile fashion.  Following this, an incision was made in an angular basis from just distal and lateral to the anterior superior iliac spine towards the greater trochanter in the subcutaneous tissue down the level of the of the tensor fascia which were clearly identified and then divided in line.  The finger dissection was then used to get over the top of the tensor fascia and retractors put in place.  Superior to the neck, we used a  Cobb to remove some of the rectus in the anterior part of the neck and then put a retractor in the anterior part of the neck.  The vessels were then identified and cauterized, and then we freed up the vastus lateralis to allow it to move more laterally at this point.  Once that was done, the hip capsule was opened and tagged and the Cobb elevator was then used after some traction was placed on the hip to help free up the hip. We then put a drill and corkscrew and externally rotated the hip and dislocated the hip with a hip skid at this point, put the hip back in place. Provisional neck cut was made followed by removal of the hip ball, and attention was then turned towards the acetabulum.  With 50 degrees of external rotation and traction,  retractors were placed anterior to the hip and posterior to the hip.  Acetabulum was sequentially reamed to a level of 49 and a 50 mm cup was placed but used a 52 on the other side, looked generous and I felt like we got a great fit and fill with fluoroscopic imaging on this, so we are very happy with the placement at this point.  Once this was done, the Gription Pinnacle cup with no holes was placed and a 45 degrees of lateral opening and 20 degrees of anteversion.  I got an excellent position on this and then put in a hole eliminator and a final liner, +4 neutral liner was placed and hammered into place.  Once this was done, attention was turned towards the femoral side where the trochanter was  identified and we externally rotated the hip, took it down over, placed into a leg holder from the hana bed, femoral holder from the hana bed, and then elevated the femur into the wound.  Once this was done, the femur was opened with a curette as well as a rongeur and then followed by the chili pepper.  Once this was placed, we sequentially rasped the femur up to a size 10 Corail stem and we were able to advance the femur 10 down.  I got it down pretty nicely and got a pretty good calcar ream.  We were very satisfied with this.  We did a reduction and checked it out.  We were happy with the position of what we had, re-dislocated the hip, and took a 10 Corail stem and put it down.  We could not advance it as far as we would like, we really hammered it way out, but just could not advance it for whatever reason.  At that point, felt that we were going to have to take it.  I did not think that advancing it farther would make a difference. At about 4 mm proud and I felt like we had perfectly symmetrical leg lengths that had reduction previously, so we accepted this, put a delta ceramic hip ball in place, got a final x-ray, showed excellent fit and fill of the hip with no tendency towards varus, got a reasonable leg length. We were probably off by a couple of millimeters but felt that this was acceptable.  At this point, we closed the capsule, put some Exparel in the capsule, closed the tensor fascia, put some Exparel on both sides of the closure, and then we were able to get a little flat layer closed at this point, and closed that and then closed the superficial fat layer and the skin with 2-0 Vicryl, 3-0 Monocryl, subcuticular.  Benzoin and Steri-Strips were applied.  Sterile compressive dressing was applied and the patient was taken to the recovery room, was noted to be in satisfactory condition.  Estimated blood loss for the procedure was 600 mL.     Harvie JuniorJohn L. Colletta Spillers,  M.D.     Ranae PlumberJLG/MEDQ  D:  05/15/2014  T:  05/16/2014  Job:  [id number]

## 2014-05-16 NOTE — Anesthesia Postprocedure Evaluation (Signed)
  Anesthesia Post-op Note  Patient: Victoria Morgan  Procedure(s) Performed: Procedure(s) (LRB): TOTAL HIP ARTHROPLASTY ANTERIOR APPROACH (Left)  Patient Location: PACU  Anesthesia Type: General  Level of Consciousness: awake and alert   Airway and Oxygen Therapy: Patient Spontanous Breathing  Post-op Pain: mild  Post-op Assessment: Post-op Vital signs reviewed, Patient's Cardiovascular Status Stable, Respiratory Function Stable, Patent Airway and No signs of Nausea or vomiting  Last Vitals:  Filed Vitals:   05/16/14 0632  BP: 116/46  Pulse: 69  Temp: 36.7 C  Resp:     Post-op Vital Signs: stable   Complications: No apparent anesthesia complications

## 2014-05-16 NOTE — Evaluation (Signed)
Physical Therapy Evaluation Patient Details Name: Victoria Morgan MRN: [id number] DOB: [birth year] Today's Date: 05/16/2014   History of Present Illness  pt presents post L THA and recent R THA in April 2015.    Clinical Impression  Pt very motivated to mobilize this am.  Pt with good recall of safety from previous THA in April.  Discussed with pt D/C planning and pt indicates she will be D/C ing tomorrow.  Will continue to follow while on acute.      Follow Up Recommendations Home health PT;Supervision - Intermittent    Equipment Recommendations  None recommended by PT    Recommendations for Other Services       Precautions / Restrictions Precautions Precautions: None Restrictions Weight Bearing Restrictions: Yes LLE Weight Bearing: Weight bearing as tolerated      Mobility  Bed Mobility Overal bed mobility: Needs Assistance Bed Mobility: Supine to Sit     Supine to sit: Min assist;HOB elevated     General bed mobility comments: A with L LE only.    Transfers Overall transfer level: Needs assistance Equipment used: Rolling walker (2 wheeled) Transfers: Sit to/from Stand Sit to Stand: Min guard         General transfer comment: cues for UE use.  pt tends to pull on RW to stand.    Ambulation/Gait Ambulation/Gait assistance: Min guard Ambulation Distance (Feet): 160 Feet Assistive device: Rolling walker (2 wheeled) Gait Pattern/deviations: Step-to pattern;Decreased step length - right;Decreased stance time - left;Decreased stride length     General Gait Details: pt moves slowly, but demos good use of RW.    Stairs            Wheelchair Mobility    Modified Rankin (Stroke Patients Only)       Balance Overall balance assessment: No apparent balance deficits (not formally assessed)                                           Pertinent Vitals/Pain Pain Assessment: 0-10 Pain Score: 4  Pain Location: Incision Pain  Descriptors / Indicators: Burning Pain Intervention(s): Repositioned;Premedicated before session    Home Living Family/patient expects to be discharged to:: Private residence Living Arrangements: Spouse/significant other Available Help at Discharge: Family;Available 24 hours/day Type of Home: House Home Access: Stairs to enter Entrance Stairs-Rails: Doctor, general practice of Steps: 6 Home Layout: One level Home Equipment: Cane - single point;Walker - 2 wheels;Bedside commode      Prior Function Level of Independence: Independent               Hand Dominance   Dominant Hand: Right    Extremity/Trunk Assessment   Upper Extremity Assessment: Defer to OT evaluation           Lower Extremity Assessment: LLE deficits/detail   LLE Deficits / Details: Generalized weakness post-op.    Cervical / Trunk Assessment: Normal  Communication   Communication: No difficulties  Cognition Arousal/Alertness: Awake/alert Behavior During Therapy: WFL for tasks assessed/performed Overall Cognitive Status: Within Functional Limits for tasks assessed                      General Comments      Exercises Total Joint Exercises Ankle Circles/Pumps: AROM;Both;10 reps Quad Sets: AROM;Both;10 reps Hip ABduction/ADduction: AROM;Left;10 reps Long Arc Quad: AROM;Left;10 reps      Assessment/Plan  PT Assessment Patient needs continued PT services  PT Diagnosis Abnormality of gait;Acute pain   PT Problem List Decreased strength;Decreased activity tolerance;Decreased balance;Decreased mobility;Decreased knowledge of use of DME;Pain  PT Treatment Interventions DME instruction;Gait training;Stair training;Functional mobility training;Therapeutic activities;Therapeutic exercise;Balance training;Patient/family education   PT Goals (Current goals can be found in the Care Plan section) Acute Rehab PT Goals Patient Stated Goal: Get back to water exercising.   PT Goal  Formulation: With patient Time For Goal Achievement: 05/23/14 Potential to Achieve Goals: Good    Frequency 7X/week   Barriers to discharge        Co-evaluation               End of Session Equipment Utilized During Treatment: Gait belt Activity Tolerance: Patient tolerated treatment well Patient left: in chair;with call bell/phone within reach Nurse Communication: Mobility status         Time: [id number] PT Time Calculation (min): 28 min   Charges:   PT Evaluation $Initial PT Evaluation Tier I: 1 Procedure PT Treatments $Gait Training: 8-22 mins   PT G CodesSunny Schlein:          Asami Lambright F, [place] [phone] 05/16/2014, 10:45 AM

## 2014-05-16 NOTE — Progress Notes (Signed)
Subjective: 1 Day Post-Op Procedure(s) (LRB): TOTAL HIP ARTHROPLASTY ANTERIOR APPROACH (Left) Patient reports pain as 5 on 0-10 scale.  Foley catheter removed this morning.  Has not voided yet.  Objective: Vital signs in last 24 hours: Temp:  [97.4 F (36.3 C)-98.8 F (37.1 C)] 98.1 F (36.7 C) (10/06 82950632) Pulse Rate:  [52-74] 69 (10/06 0632) Resp:  [6-20] 14 (10/05 1832) BP: (116-167)/(46-78) 116/46 mmHg (10/06 0632) SpO2:  [99 %-100 %] 100 % (10/06 [id number]) Weight:  [103.42 kg (228 lb)] 103.42 kg (228 lb) (10/05 1049)  Intake/Output from previous day: 10/05 0701 - 10/06 0700 In: 2710 [P.O.:60; I.V.:2650] Out: 4700 [Urine:4100; Blood:600] Intake/Output this shift:     Recent Labs  05/15/14 1046 05/16/14 0547  HGB 12.2 9.8*    Recent Labs  05/15/14 1046 05/16/14 0547  WBC 11.7* 10.2  RBC 4.08 3.22*  HCT 34.6* 28.1*  PLT 221 149*    Recent Labs  05/16/14 0547  NA 143  K 3.7  CL 106  CO2 27  BUN 11  CREATININE 0.90  GLUCOSE 86  CALCIUM 8.3*  left hip exam: Neurovascular intact Sensation intact distally Intact pulses distally Dorsiflexion/Plantar flexion intact Incision: dressing C/D/I Compartment soft  Assessment/Plan: 1 Day Post-Op Procedure(s) (LRB): TOTAL HIP ARTHROPLASTY ANTERIOR APPROACH (Left) Thrombocytopenia, stable.some drift downward. Plan: Prednisone 10 mg orally one daily to maintain platelet level per her hematologist.check CBC/platelet count in the a.m. Continue enteric-coated aspirin 325 mg twice daily, along with SCDs for DVT prophylaxis. Weight-bear as tolerated on left without hip precautions. Up with therapy Plan for discharge tomorrow to home with home health physical therapy.  Sharen Youngren G 05/16/2014, 9:07 AM

## 2014-05-16 NOTE — Plan of Care (Signed)
Problem: Consults Goal: Diagnosis- Total Joint Replacement Primary Total Hip Left     

## 2014-05-17 LAB — CBC
HCT: 28 % — ABNORMAL LOW (ref 36.0–46.0)
HEMOGLOBIN: 9.6 g/dL — AB (ref 12.0–15.0)
MCH: 29.6 pg (ref 26.0–34.0)
MCHC: 34.3 g/dL (ref 30.0–36.0)
MCV: 86.4 fL (ref 78.0–100.0)
Platelets: 192 10*3/uL (ref 150–400)
RBC: 3.24 MIL/uL — AB (ref 3.87–5.11)
RDW: 14.7 % (ref 11.5–15.5)
WBC: 13.4 10*3/uL — ABNORMAL HIGH (ref 4.0–10.5)

## 2014-05-17 LAB — TYPE AND SCREEN
ABO/RH(D): O POS
ANTIBODY SCREEN: NEGATIVE
UNIT DIVISION: 0
Unit division: 0

## 2014-05-17 NOTE — Discharge Summary (Signed)
Patient ID: Victoria Morgan MRN: [id number] DOB/AGE: 56-06-1956 56 y.o.  Admit date: 05/15/2014 Discharge date: 05/17/2014  Admission Diagnoses:  Principal Problem:   Osteoarthritis of left hip Active Problems:   Acute blood loss anemia   Thrombocytopenia   Discharge Diagnoses:  Same  Past Medical History  Diagnosis Date  . Asthma   . Blood dyscrasia     ITP   dx in 1990  . Arthritis     Surgeries: Procedure(s):Left TOTAL HIP ARTHROPLASTY ANTERIOR APPROACH on 05/15/2014   Discharged Condition: Improved  Hospital Course: Taje Tondreau is an 58 y.o. female who was admitted 05/15/2014 for operative treatment ofOsteoarthritis of left hip. Patient has severe unremitting pain that affects sleep, daily activities, and work/hobbies. After pre-op clearance the patient was taken to the operating room on 05/15/2014 and underwent  Procedure(s):Left TOTAL HIP ARTHROPLASTY ANTERIOR APPROACH.    Patient was given perioperative antibiotics: Anti-infectives   Start     Dose/Rate Route Frequency Ordered Stop   05/15/14 1900  ceFAZolin (ANCEF) IVPB 2 g/50 mL premix     2 g 100 mL/hr over 30 Minutes Intravenous Every 6 hours 05/15/14 1840 05/16/14 0145   05/15/14 0600  ceFAZolin (ANCEF) IVPB 2 g/50 mL premix     2 g 100 mL/hr over 30 Minutes Intravenous On call to O.R. 05/14/14 1254 05/15/14 1240       Patient was given sequential compression devices, early ambulation, and chemoprophylaxis to prevent DVT.Oral prednisone in hospital 10mg  daily to maintain PLT count. No dizziness.  Patient benefited maximally from hospital stay and there were no complications.    Recent vital signs: Patient Vitals for the past 24 hrs:  BP Temp Temp src Pulse SpO2 Height Weight  05/17/14 0900 - - - - - 5\' 5"  (1.651 m) 102.059 kg (225 lb)  05/17/14 0600 122/61 mmHg 99.3 F (37.4 C) Oral 96 97 % - -  05/16/14 2058 149/71 mmHg 98.1 F (36.7 C) Oral 102 99 % - -     Recent  laboratory studies:  Recent Labs  05/16/14 0547 05/17/14 0853  WBC 10.2 13.4*  HGB 9.8* 9.6*  HCT 28.1* 28.0*  PLT 149* 192  NA 143  --   K 3.7  --   CL 106  --   CO2 27  --   BUN 11  --   CREATININE 0.90  --   GLUCOSE 86  --   CALCIUM 8.3*  --      Discharge Medications:     Medication List    STOP taking these medications       meloxicam 15 MG tablet  Commonly known as:  MOBIC      TAKE these medications       aspirin EC 325 MG tablet  Take 1 tablet (325 mg total) by mouth 2 (two) times daily after a meal. Take x 3 weeks post op,     CALCIUM + D PO  Take 1 tablet by mouth daily.     cetirizine 10 MG tablet  Commonly known as:  ZYRTEC  Take 10 mg by mouth daily as needed for allergies.     CLEAR EYES OP  Place 1 drop into both eyes daily.     furosemide 20 MG tablet  Commonly known as:  LASIX  Take 20 mg by mouth daily as needed for fluid (Ankle swelling).     GARCINIA CAMBOGIA-CHROMIUM PO  Take 1 tablet by mouth daily.     Melatonin 10 MG  Caps  Take 10 mg by mouth at bedtime as needed.     methocarbamol 750 MG tablet  Commonly known as:  ROBAXIN-750  Take 1 tablet (750 mg total) by mouth every 8 (eight) hours as needed for muscle spasms.     multivitamin with minerals Tabs tablet  Take 1 tablet by mouth daily.     oxyCODONE-acetaminophen 5-325 MG per tablet  Commonly known as:  PERCOCET/ROXICET  Take 1-2 tablets by mouth every 6 (six) hours as needed for severe pain.     predniSONE 10 MG tablet  Commonly known as:  DELTASONE  Take 1 tablet (10 mg total) by mouth daily with breakfast.     TYLENOL ARTHRITIS PAIN 650 MG CR tablet  Generic drug:  acetaminophen  Take 650 mg by mouth 2 (two) times daily as needed for pain.        Diagnostic Studies: Dg Hip Operative Left  05/15/2014   CLINICAL DATA:  Status post left hip replacement.  EXAM: OPERATIVE LEFT HIP  COMPARISON:  Previous examinations, the most recent dated 11/28/2013.  FINDINGS:  Interval left total hip prosthesis. The prosthetic components are in expected position and alignment on the single frontal C-arm view. No fracture or dislocation seen.  IMPRESSION: Satisfactory appearance of a left total hip prosthesis on a single C-arm view.   Electronically Signed   By: Gordan PaymentSteve  Reid M.D.   On: 05/15/2014 15:30    Disposition: 01-Home or Self Care      Discharge Instructions   Call MD / Call 911    Complete by:  As directed   If you experience chest pain or shortness of breath, CALL 911 and be transported to the hospital emergency room.  If you develope a fever above 101 F, pus (white drainage) or increased drainage or redness at the wound, or calf pain, call your surgeon's office.     Constipation Prevention    Complete by:  As directed   Drink plenty of fluids.  Prune juice may be helpful.  You may use a stool softener, such as Colace (over the counter) 100 mg twice a day.  Use MiraLax (over the counter) for constipation as needed.     Diet general    Complete by:  As directed      Follow the hip precautions as taught in Physical Therapy    Complete by:  As directed      Increase activity slowly as tolerated    Complete by:  As directed      Weight bearing as tolerated    Complete by:  As directed   Laterality:  left  Extremity:  Lower     Weight bearing as tolerated    Complete by:  As directed   Laterality:  left  Extremity:  Lower           Follow-up Information   Follow up with GRAVES,JOHN L, MD. Schedule an appointment as soon as possible for a visit in 2 weeks.   Specialty:  Orthopedic Surgery   Contact information:   [address] Sp[address] [place] [address] [phone]       Follow up with [facility]. (Someone from [facility] will contact you concerning start date and time for physical therapy.)    Contact information:   [address] [place] [address] [phone]        Signed: Matthew FolksBETHUNE,[place]vers G 05/17/2014,  10:48 AM

## 2014-05-17 NOTE — Progress Notes (Signed)
Subjective: 2 Days Post-Op Procedure(s) (LRB): TOTAL HIP ARTHROPLASTY ANTERIOR APPROACH (Left) Patient reports pain as mild.    Objective: Vital signs in last 24 hours: Temp:  [98.1 F (36.7 C)-99.3 F (37.4 C)] 99.3 F (37.4 C) (10/07 0600) Pulse Rate:  [96-102] 96 (10/07 0600) BP: (122-149)/(61-71) 122/61 mmHg (10/07 0600) SpO2:  [97 %-99 %] 97 % (10/07 0600)  Intake/Output from previous day: 10/06 0701 - 10/07 0700 In: 1646.7 [P.O.:600; I.V.:1046.7] Out: 250 [Urine:250] Intake/Output this shift:     Recent Labs  05/15/14 1046 05/16/14 0547  HGB 12.2 9.8*    Recent Labs  05/15/14 1046 05/16/14 0547  WBC 11.7* 10.2  RBC 4.08 3.22*  HCT 34.6* 28.1*  PLT 221 149*    Recent Labs  05/16/14 0547  NA 143  K 3.7  CL 106  CO2 27  BUN 11  CREATININE 0.90  GLUCOSE 86  CALCIUM 8.3*   No results found for this basename: LABPT, INR,  in the last 72 hours  Neurologically intact ABD soft Neurovascular intact Sensation intact distally Intact pulses distally Dorsiflexion/Plantar flexion intact No cellulitis present Compartment soft  Assessment/Plan: 2 Days Post-Op Procedure(s) (LRB): TOTAL HIP ARTHROPLASTY ANTERIOR APPROACH (Left) Advance diet Up with therapy Discharge home with home health  Victoria Morgan 05/17/2014, 8:15 AM

## 2014-05-17 NOTE — Progress Notes (Signed)
Discharge instructions and prescriptions reviewed with patient. Patient denies questions or concerns at this time. IV removed with no complications. Vital signs stable. Patient set up with [facility] for PT, and has all necessary equipment for home. Patient discharged via wheelchair with personal belongings, discharge instructions, and prescriptions.

## 2014-05-17 NOTE — Progress Notes (Signed)
Physical Therapy Treatment Patient Details Name: Victoria PillarShelia Morgan MRN: [id number] DOB: 10/09/1955 Today's Date: 05/17/2014    History of Present Illness pt presents post L THA and recent R THA in April 2015.      PT Comments    Pt doing great this am and demos good safety with mobility.  Pt ready for D/C from PT stand point.    Follow Up Recommendations  Home health PT;Supervision - Intermittent     Equipment Recommendations  None recommended by PT    Recommendations for Other Services       Precautions / Restrictions Precautions Precautions: None Restrictions Weight Bearing Restrictions: Yes LLE Weight Bearing: Weight bearing as tolerated    Mobility  Bed Mobility Overal bed mobility: Modified Independent                Transfers Overall transfer level: Modified independent Equipment used: Rolling walker (2 wheeled)                Ambulation/Gait Ambulation/Gait assistance: Supervision Ambulation Distance (Feet): 200 Feet (x2) Assistive device: Rolling walker (2 wheeled) Gait Pattern/deviations: Step-through pattern;Decreased stride length     General Gait Details: pt able to increase ambulation distance today and demos good safety.     Stairs Stairs: Yes Stairs assistance: Min guard Stair Management: Two rails;Step to pattern;Forwards Number of Stairs: 5 (x2) General stair comments: pt demos good technique and only needs increased time to coordinate gait sequencing on stairs.    Wheelchair Mobility    Modified Rankin (Stroke Patients Only)       Balance Overall balance assessment: No apparent balance deficits (not formally assessed)                                  Cognition Arousal/Alertness: Awake/alert Behavior During Therapy: WFL for tasks assessed/performed Overall Cognitive Status: Within Functional Limits for tasks assessed                      Exercises      General Comments         Pertinent Vitals/Pain Pain Assessment: 0-10 Pain Score: 2  Pain Location: Incision Pain Descriptors / Indicators: Burning Pain Intervention(s): Premedicated before session;Repositioned    Home Living                      Prior Function            PT Goals (current goals can now be found in the care plan section) Acute Rehab PT Goals Patient Stated Goal: Get back to water exercising.   PT Goal Formulation: With patient Time For Goal Achievement: 05/23/14 Potential to Achieve Goals: Good Progress towards PT goals: Progressing toward goals    Frequency  7X/week    PT Plan Current plan remains appropriate    Co-evaluation             End of Session   Activity Tolerance: Patient tolerated treatment well Patient left: in bed;with call bell/phone within reach     Time: 0824-0855 PT Time Calculation (min): 31 min  Charges:  $Gait Training: 23-37 mins                    G CodesSunny Schlein:      [name] F, [place] [phone] 05/17/2014, 9:06 AM

## 2014-05-17 NOTE — Progress Notes (Signed)
Subjective: 2 Days Post-Op Procedure(s) (LRB): TOTAL HIP ARTHROPLASTY ANTERIOR APPROACH (Left) Patient reports pain as moderate.  Good progress with PT. No dizziness.  Objective: Vital signs in last 24 hours: Temp:  [98.1 F (36.7 C)-99.3 F (37.4 C)] 99.3 F (37.4 C) (10/07 0600) Pulse Rate:  [96-102] 96 (10/07 0600) BP: (122-149)/(61-71) 122/61 mmHg (10/07 0600) SpO2:  [97 %-99 %] 97 % (10/07 0600) Weight:  [102.059 kg (225 lb)] 102.059 kg (225 lb) (10/07 0900)  Intake/Output from previous day: 10/06 0701 - 10/07 0700 In: 1886.7 [P.O.:840; I.V.:1046.7] Out: 250 [Urine:250] Intake/Output this shift:     Recent Labs  05/15/14 1046 05/16/14 0547 05/17/14 0853  HGB 12.2 9.8* 9.6*    Recent Labs  05/16/14 0547 05/17/14 0853  WBC 10.2 13.4*  RBC 3.22* 3.24*  HCT 28.1* 28.0*  PLT 149* 192    Recent Labs  05/16/14 0547  NA 143  K 3.7  CL 106  CO2 27  BUN 11  CREATININE 0.90  GLUCOSE 86  CALCIUM 8.3*   Left hip exam: Neurovascular intact Sensation intact distally Intact pulses distally Dorsiflexion/Plantar flexion intact Incision: dressing C/D/I Compartment soft  Assessment/Plan: 2 Days Post-Op Procedure(s) (LRB): TOTAL HIP ARTHROPLASTY ANTERIOR APPROACH (Left) Thrombocytopenia  Stable ABLA expected  Asymptomatic Plan: Discharge home with home health F/U Dr Luiz BlareGraves in 2 weeks ASA 325mg  BID x 3 weeks.  Lenice Koper G 05/17/2014, 10:45 AM

## 2014-05-17 NOTE — Care Management Note (Signed)
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 Patient underwent left total hip arthroplasty.     Action/Plan:   Case manager spoke with patient concerning home health and DME needs at discharge. Patient preoperatively setup with [facility], no changes. has RW and 3in1 from previous hip surgery. Has support at discharge.   Anticipated DC Date:  05/17/2014   Anticipated DC Plan:  HOME W HOME HEALTH SERVICES      DC Planning Services  CM consult      [facility]   Choice offered to / List presented to:  C-1 Patient   DME arranged  NA        HH arranged  HH-2 PT      HH agency  [facility]   Status of service:  Completed, signed off Medicare Important Message given?   (If response is "NO", the following Medicare IM given date fields will be blank) Date Medicare IM given:   Medicare IM given by:   Date Additional Medicare IM given:   Additional Medicare IM given by:    Discharge Disposition:  HOME W HOME HEALTH SERVICES  Per UR Regulation:  Reviewed for med. necessity/level of care/duration of stay  If discussed at Long Length of Stay Meetings, dates discussed:

## 2014-05-18 IMAGING — CR DG PORTABLE PELVIS
2 series · 2 of 2 positions shown · non-contrast
Comparison: DG HIP OPERATIVE*R* dated 11/28/2013

CLINICAL DATA: postop

EXAM:
PORTABLE PELVIS 1-2 VIEWS

[AP (1 of 2)]
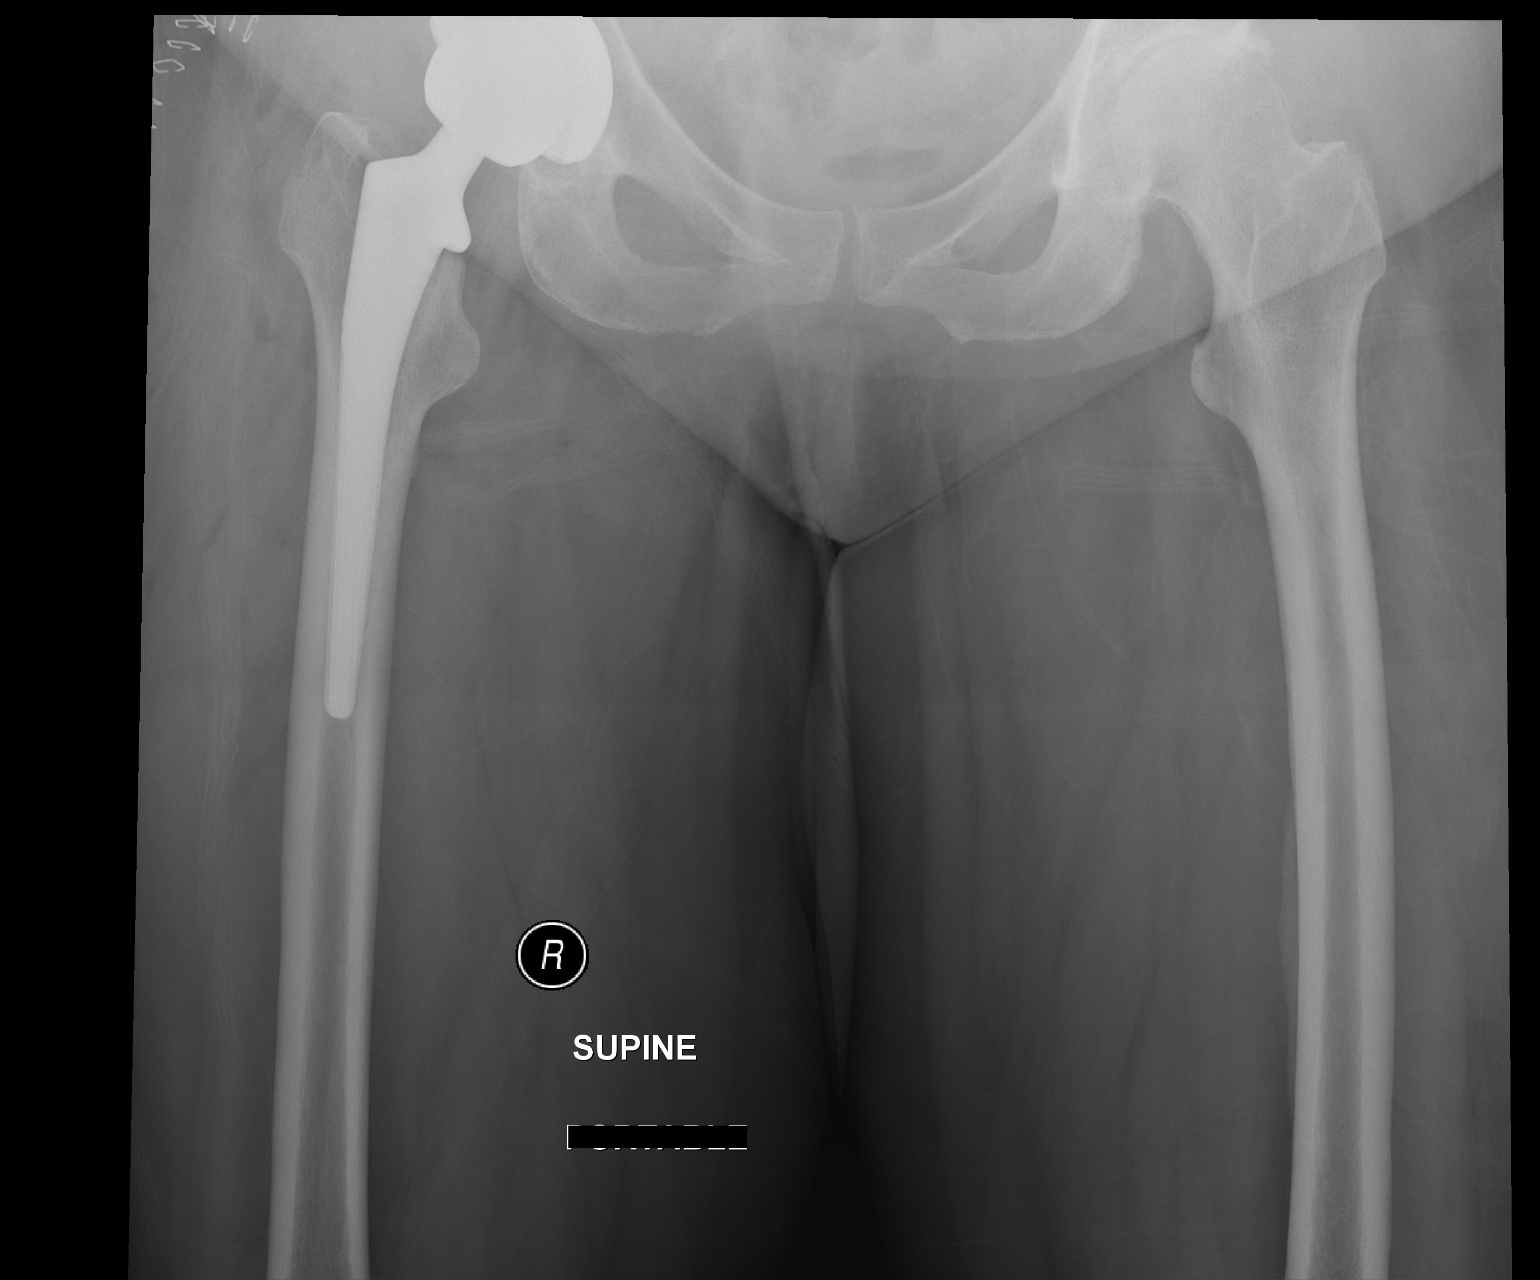

[AP (2 of 2)]
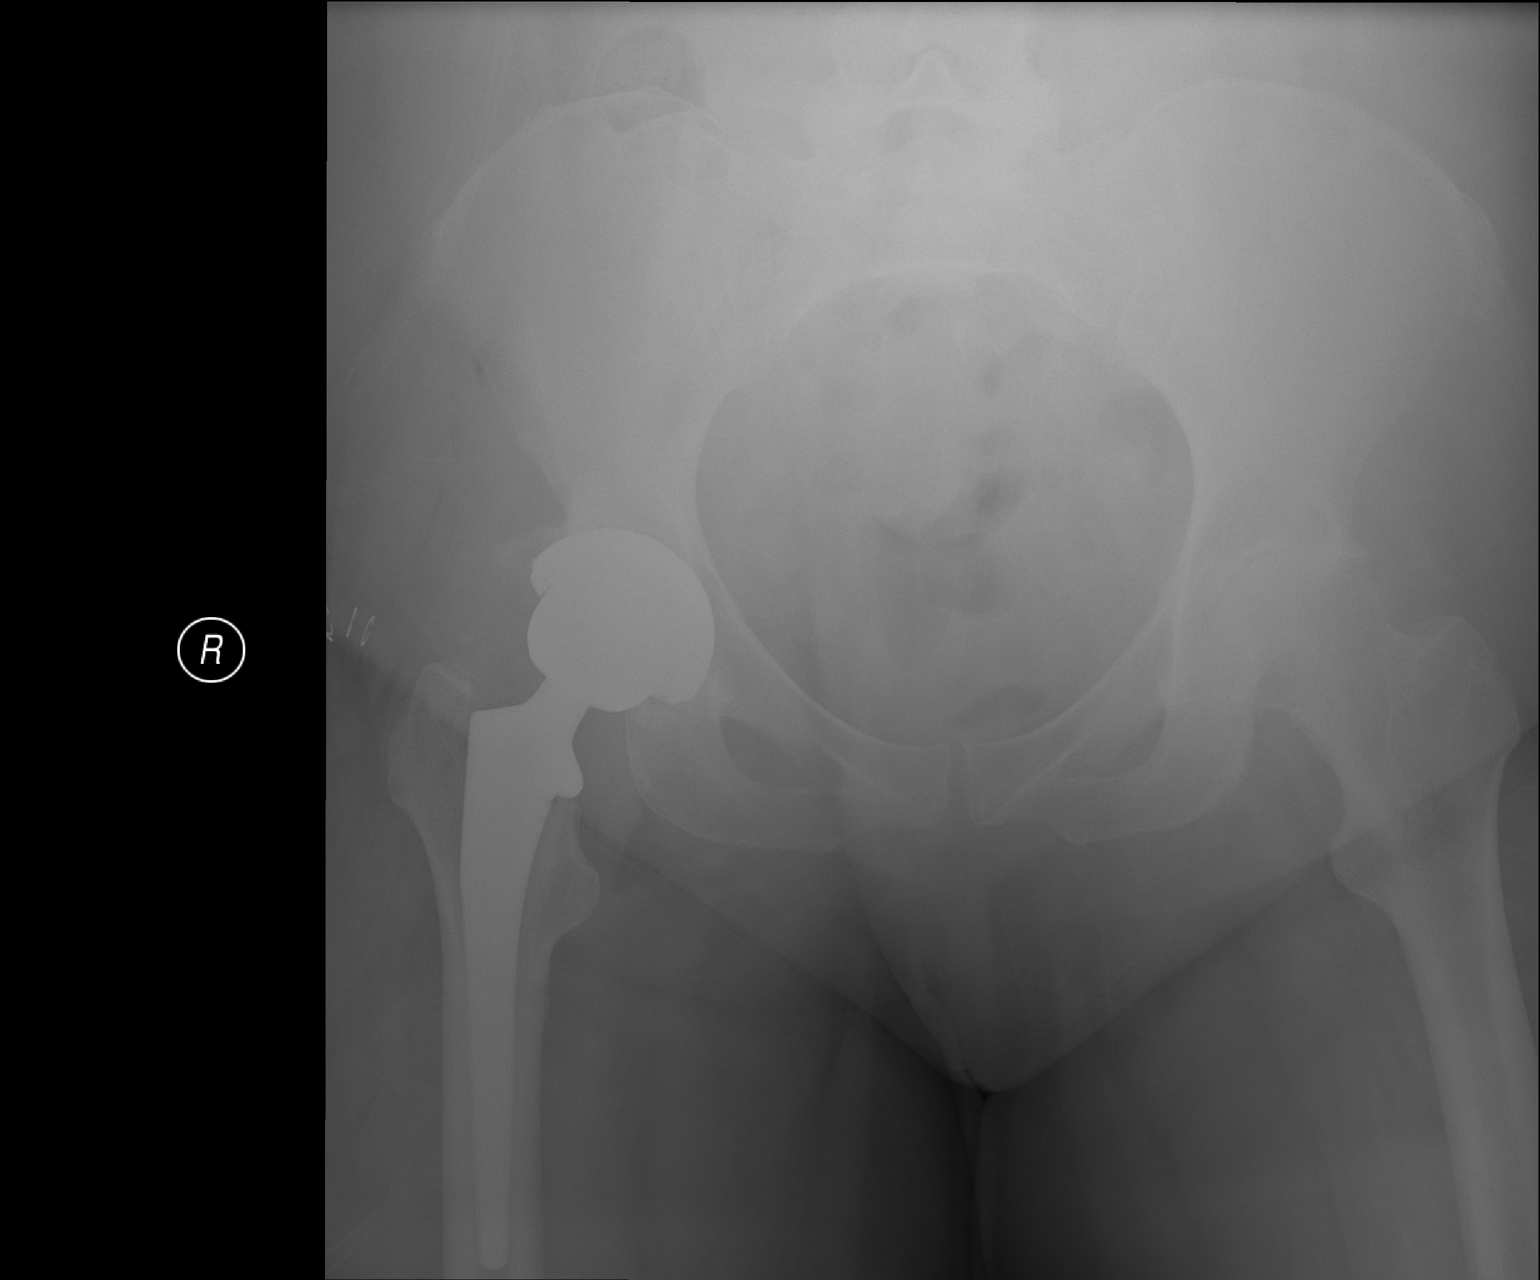

[2 of 2 positions shown; findings below may reference images not displayed]

FINDINGS: Patient is status post right hip arthroplasty. Hardware native
osseous structures are intact. Postsurgical changes are appreciated
within the soft tissues of the right hip.
IMPRESSION: Patient is status post total right hip arthroplasty.

## 2014-05-30 LAB — CANCER CTR PLATELET CT: PLATELETS: 182 x10 3/mm (ref 150–440)

## 2014-06-11 ENCOUNTER — Ambulatory Visit: Payer: Self-pay | Admitting: Internal Medicine

## 2014-07-04 LAB — CANCER CTR PLATELET CT: Platelet: 69 x10 3/mm — ABNORMAL LOW (ref 150–440)

## 2014-07-11 ENCOUNTER — Ambulatory Visit: Payer: Self-pay | Admitting: Internal Medicine

## 2014-07-11 LAB — PLATELET COUNT: Platelet: 84 10*3/uL — ABNORMAL LOW (ref 150–440)

## 2014-08-08 LAB — PLATELET COUNT: Platelet: 68 10*3/uL — ABNORMAL LOW (ref 150–440)

## 2014-08-11 ENCOUNTER — Ambulatory Visit: Payer: Self-pay | Admitting: Internal Medicine

## 2014-09-26 ENCOUNTER — Ambulatory Visit: Payer: Self-pay | Admitting: Internal Medicine

## 2014-10-10 ENCOUNTER — Ambulatory Visit
Admit: 2014-10-10 | Disposition: A | Discharge status: Home / Self Care | Payer: Self-pay | Attending: Internal Medicine | Admitting: Internal Medicine

## 2014-11-07 LAB — PLATELET COUNT: PLATELETS: 53 10*3/uL — AB (ref 150–440)

## 2014-11-10 ENCOUNTER — Ambulatory Visit
Admit: 2014-11-10 | Disposition: A | Discharge status: Home / Self Care | Payer: Self-pay | Attending: Internal Medicine | Admitting: Internal Medicine

## 2014-12-05 LAB — PLATELET COUNT: Platelet: 43 10*3/uL — ABNORMAL LOW (ref 150–440)

## 2014-12-07 DIAGNOSIS — Z9081 Acquired absence of spleen: Secondary | ICD-10-CM | POA: Insufficient documentation

## 2015-01-16 ENCOUNTER — Inpatient Hospital Stay: Payer: 59 | Attending: Internal Medicine

## 2015-01-16 DIAGNOSIS — D693 Immune thrombocytopenic purpura: Secondary | ICD-10-CM | POA: Insufficient documentation

## 2015-01-16 DIAGNOSIS — Z79899 Other long term (current) drug therapy: Secondary | ICD-10-CM | POA: Diagnosis not present

## 2015-01-16 LAB — PLATELET COUNT: PLATELETS: 46 10*3/uL — AB (ref 150–440)

## 2015-01-18 ENCOUNTER — Telehealth: Payer: Self-pay | Admitting: *Deleted

## 2015-01-18 NOTE — Telephone Encounter (Signed)
Message left; as per her request; that her platelet count was 46 on Tuesday.Victoria KitchenMarland Morgan

## 2015-02-27 ENCOUNTER — Inpatient Hospital Stay: Payer: 59 | Attending: Family Medicine

## 2015-02-27 DIAGNOSIS — D693 Immune thrombocytopenic purpura: Secondary | ICD-10-CM | POA: Diagnosis present

## 2015-02-27 LAB — PLATELET COUNT: Platelets: 43 10*3/uL — ABNORMAL LOW (ref 150–440)

## 2015-03-19 ENCOUNTER — Other Ambulatory Visit: Payer: Self-pay | Admitting: *Deleted

## 2015-03-19 DIAGNOSIS — D696 Thrombocytopenia, unspecified: Secondary | ICD-10-CM

## 2015-03-20 ENCOUNTER — Inpatient Hospital Stay: Payer: 59 | Attending: Family Medicine

## 2015-03-20 ENCOUNTER — Inpatient Hospital Stay (HOSPITAL_BASED_OUTPATIENT_CLINIC_OR_DEPARTMENT_OTHER): Payer: 59 | Admitting: Family Medicine

## 2015-03-20 VITALS — BP 124/59 | HR 66 | Temp 96.9°F | Resp 16 | Wt 235.2 lb

## 2015-03-20 DIAGNOSIS — R5381 Other malaise: Secondary | ICD-10-CM | POA: Diagnosis not present

## 2015-03-20 DIAGNOSIS — D693 Immune thrombocytopenic purpura: Secondary | ICD-10-CM | POA: Insufficient documentation

## 2015-03-20 DIAGNOSIS — M1990 Unspecified osteoarthritis, unspecified site: Secondary | ICD-10-CM | POA: Insufficient documentation

## 2015-03-20 DIAGNOSIS — Z79899 Other long term (current) drug therapy: Secondary | ICD-10-CM | POA: Insufficient documentation

## 2015-03-20 DIAGNOSIS — D696 Thrombocytopenia, unspecified: Secondary | ICD-10-CM

## 2015-03-20 DIAGNOSIS — R5383 Other fatigue: Secondary | ICD-10-CM | POA: Insufficient documentation

## 2015-03-20 DIAGNOSIS — Z91018 Allergy to other foods: Secondary | ICD-10-CM | POA: Insufficient documentation

## 2015-03-20 DIAGNOSIS — Z9221 Personal history of antineoplastic chemotherapy: Secondary | ICD-10-CM

## 2015-03-20 DIAGNOSIS — Z9081 Acquired absence of spleen: Secondary | ICD-10-CM | POA: Diagnosis not present

## 2015-03-20 NOTE — Progress Notes (Signed)
[facility]  Telephone:(336) [phone]  Fax:([phone]     Victoria Morgan DOB: Dec 31, 1955  MR#: [id number]  AOZ#:[id number]  Patient Care Team: Mickey Farber, MD as PCP - General (Internal Medicine)  CHIEF COMPLAINT:  Chief Complaint  Patient presents with  . Follow-up   Patient with significant medical history of ITP, prednisone responsive. Her last note of Dr. Paula Compton from February 2016 patient was previously treated with a splenectomy, VCR in 1998, R HO 2 at [place], Rituxan 4 week course in 2003. Also received endplate from 2010- 2012 with good response. Currently patient is not taking any prednisone.  INTERVAL HISTORY:  Patient is here for continued follow-up and treatment consideration regarding ITP. Previous treatments as above and per Dr. Paula Compton note from February 2016. She denies any easy bleeding, acute blood loss, easy bruising. She does report some fatigue but relates that more to the hot weather.  REVIEW OF SYSTEMS:   Review of Systems  Constitutional: Positive for malaise/fatigue. Negative for fever, chills, weight loss and diaphoresis.  HENT: Negative for congestion, ear discharge, ear pain, hearing loss, nosebleeds, sore throat and tinnitus.   Eyes: Negative for blurred vision, double vision, photophobia, pain, discharge and redness.  Respiratory: Negative for cough, hemoptysis, sputum production, shortness of breath, wheezing and stridor.   Cardiovascular: Negative for chest pain, palpitations, orthopnea, claudication, leg swelling and PND.  Gastrointestinal: Negative for heartburn, nausea, vomiting, abdominal pain, diarrhea, constipation, blood in stool and melena.  Genitourinary: Negative.   Musculoskeletal: Negative.   Skin: Negative.   Neurological: Negative for dizziness, tingling, focal weakness, seizures, weakness and headaches.  Endo/Heme/Allergies: Does not bruise/bleed easily.  Psychiatric/Behavioral: Negative for depression. The  patient is not nervous/anxious and does not have insomnia.     As per HPI. Otherwise, a complete review of systems is negatve.  ONCOLOGY HISTORY:  No history exists.    PAST MEDICAL HISTORY: Past Medical History  Diagnosis Date  . Asthma   . Blood dyscrasia     ITP   dx in 1990  . Arthritis     PAST SURGICAL HISTORY: Past Surgical History  Procedure Laterality Date  . Spleenectomy      x 2  . Tubal ligation    . Ctr      right hand  . 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 Total hip arthroplasty Left 05/15/2014    Procedure: TOTAL HIP ARTHROPLASTY ANTERIOR APPROACH;  Surgeon: Harvie Junior, MD;  Location: [facility] OR;  Service: Orthopedics;  Laterality: Left;    FAMILY HISTORY No family history on file.  GYNECOLOGIC HISTORY:  No LMP recorded. Patient is postmenopausal.     ADVANCED DIRECTIVES:    HEALTH MAINTENANCE: History  Substance Use Topics  . Smoking status: Never Smoker   . Smokeless tobacco: Never Used  . Alcohol Use: Yes     Comment: occasionally     Colonoscopy:  PAP:  Bone density:  Lipid panel:  Allergies  Allergen Reactions  . Nsaids Rash    Contraindicated by ITP, per Dr. Lorre Nick  . Aspirin     History of low platelets  . Citrus     itch    Current Outpatient Prescriptions  Medication Sig Dispense Refill  . acetaminophen (TYLENOL ARTHRITIS PAIN) 650 MG CR tablet Take 650 mg by  mouth 2 (two) times daily as needed for pain.    . calcium carbonate (OS-CAL - DOSED IN MG OF ELEMENTAL CALCIUM) 1250 (500 CA) MG tablet Take by mouth.    . Cetirizine HCl 10 MG CAPS Take by mouth.    . furosemide (LASIX) 40 MG tablet Take by mouth.    Marland Kitchen GARCINIA CAMBOGIA-CHROMIUM PO Take 1 tablet by mouth daily.    . Melatonin 10 MG CAPS Take 10 mg by mouth at bedtime as  needed.     . Multiple Vitamin (MULTIVITAMIN WITH MINERALS) TABS tablet Take 1 tablet by mouth daily.    . Naphazoline HCl (CLEAR EYES OP) Place 1 drop into both eyes daily.    . NON FORMULARY Use as directed 1 scoop in the mouth or throat daily. IT WORKS! (The Greens) pt mixes in water or fruit juice    . Calcium Carbonate-Vitamin D (CALCIUM + D PO) Take 1 tablet by mouth daily.    . methocarbamol (ROBAXIN-750) 750 MG tablet Take 1 tablet (750 mg total) by mouth every 8 (eight) hours as needed for muscle spasms. (Patient not taking: Reported on 03/20/2015) 50 tablet 0  . oxyCODONE-acetaminophen (PERCOCET/ROXICET) 5-325 MG per tablet Take 1-2 tablets by mouth every 6 (six) hours as needed for severe pain. (Patient not taking: Reported on 03/20/2015) 60 tablet 0  . predniSONE (DELTASONE) 10 MG tablet Take 1 tablet (10 mg total) by mouth daily with breakfast. (Patient not taking: Reported on 03/20/2015) 7 tablet 0   No current facility-administered medications for this visit.    OBJECTIVE: BP 124/59 mmHg  Pulse 66  Temp(Src) 96.9 F (36.1 C) (Tympanic)  Resp 16  Wt 235 lb 3.7 oz (106.7 kg)  SpO2 99%   Body mass index is 39.14 kg/(m^2).    ECOG FS:0 - Asymptomatic  General: Well-developed, well-nourished, no acute distress. Eyes: Pink conjunctiva, anicteric sclera. HEENT: Normocephalic, moist mucous membranes, clear oropharnyx. Lungs: Clear to auscultation bilaterally. Heart: Regular rate and rhythm. No rubs, murmurs, or gallops. Abdomen: Soft, nontender, nondistended. No organomegaly noted, normoactive bowel sounds. Musculoskeletal: No edema, cyanosis, or clubbing. Neuro: Alert, answering all questions appropriately. Cranial nerves grossly intact. Skin: No rashes or petechiae noted. Psych: Normal affect.    LAB RESULTS:  No visits with results within 3 Day(s) from this visit. Latest known visit with results is:  Clinical Support on 02/27/2015  Component Date Value Ref Range Status  .  Platelets 02/27/2015 43* 150 - 440 K/uL Final    STUDIES: No results found.  ASSESSMENT:  ITP  PLAN:   1. ITP. Per notes from Dr. Lorre Nick he has recommended a platelet level of greater than 60,000 if possible. Patient is agreeable to trying a 4 week long steroid dose of 10 mg per day to see if that will increase her platelets as they are 43,000 today. Due to previous side effect prednisone was discontinued, lower extremity edema was main complaint. We'll reevaluate patient's platelet level in 4 weeks.  Patient expressed understanding and was in agreement with this plan. She also understands that She can call clinic at any time with any questions, concerns, or complaints.   Dr. Doylene Canning was available for consultation and review of plan of care for this patient.   Loann Quill, NP   03/20/2015 11:53 AM

## 2015-03-21 ENCOUNTER — Telehealth: Payer: Self-pay | Admitting: Hematology and Oncology

## 2015-03-21 ENCOUNTER — Telehealth: Payer: Self-pay | Admitting: *Deleted

## 2015-03-21 LAB — PLATELET COUNT: Platelets: 37 10*3/uL — ABNORMAL LOW (ref 150–440)

## 2015-03-21 MED ORDER — PREDNISONE 10 MG PO TABS
10.0000 mg | ORAL_TABLET | Freq: Every day | ORAL | Status: DC
Start: 2015-03-21 — End: 2015-05-30

## 2015-03-21 NOTE — Telephone Encounter (Signed)
  I don't know this patient.  M

## 2015-03-21 NOTE — Telephone Encounter (Signed)
Prednisone Rx to [facility] for 10 mg for a month. [place] said Rx not called in yet. [facility] [place].

## 2015-03-21 NOTE — Telephone Encounter (Signed)
Will f/u with Verlon Au.

## 2015-03-21 NOTE — Telephone Encounter (Signed)
E scribed # [phone] per PO L Herring, AGNP-C

## 2015-03-26 NOTE — Telephone Encounter (Signed)
Prescription escribed to pharmacy on 03/21/15.Marland KitchenMarland Kitchen

## 2015-03-29 ENCOUNTER — Telehealth: Payer: Self-pay | Admitting: *Deleted

## 2015-03-29 ENCOUNTER — Other Ambulatory Visit: Payer: Self-pay | Admitting: Family Medicine

## 2015-03-29 NOTE — Telephone Encounter (Signed)
Wanting to discuss if something other than steroids can be done for her plt. She is not sleeping and is constipated. We discussed colace bid and Miralax daily. She was given an appt for 10AM Tuesday to see L Herring in [place] office

## 2015-04-03 ENCOUNTER — Inpatient Hospital Stay (HOSPITAL_BASED_OUTPATIENT_CLINIC_OR_DEPARTMENT_OTHER): Payer: 59 | Admitting: Family Medicine

## 2015-04-03 VITALS — BP 143/72 | HR 58 | Temp 96.1°F | Wt 234.1 lb

## 2015-04-03 DIAGNOSIS — D693 Immune thrombocytopenic purpura: Secondary | ICD-10-CM

## 2015-04-03 DIAGNOSIS — Z9221 Personal history of antineoplastic chemotherapy: Secondary | ICD-10-CM

## 2015-04-03 DIAGNOSIS — M1990 Unspecified osteoarthritis, unspecified site: Secondary | ICD-10-CM

## 2015-04-03 DIAGNOSIS — R5381 Other malaise: Secondary | ICD-10-CM

## 2015-04-03 DIAGNOSIS — Z9081 Acquired absence of spleen: Secondary | ICD-10-CM

## 2015-04-03 DIAGNOSIS — R5383 Other fatigue: Secondary | ICD-10-CM

## 2015-04-03 DIAGNOSIS — Z79899 Other long term (current) drug therapy: Secondary | ICD-10-CM

## 2015-04-03 NOTE — Progress Notes (Signed)
Patient here today for follow up.  C/o fatigue and generally feeling bad. No c/o pain.  Platelet counts continue to drop.  Patient back on prednisone.

## 2015-04-03 NOTE — Progress Notes (Signed)
[facility]  Telephone:(336) [phone]  Fax:([phone]) [phone]     Victoria Morgan DOB: [birth year]  MR#: [id number]  AOZ#:[id number]  Patient Care Team: Mickey Farber, MD as PCP - General (Internal Medicine)  CHIEF COMPLAINT:  Chief Complaint  Patient presents with  . Follow-up   Patient with significant medical history of ITP, prednisone responsive. Her last note of Dr. Paula Compton from February 2016 patient was previously treated with a splenectomy, VCR in 1998, R HO 2 at [place], Rituxan 4 week course in 2003. Also received endplate from 2010- 2012 with good response. Currently patient is not taking any prednisone.  INTERVAL HISTORY:  Patient is here for continued follow-up and treatment consideration regarding ITP. Previous treatments as above and per Dr. Paula Compton note from February 2016. She is currently on prednisone 10 mg per day. She denies any easy bleeding, acute blood loss, easy bruising. She continues to complain of fatigue.  REVIEW OF SYSTEMS:   Review of Systems  Constitutional: Positive for malaise/fatigue. Negative for fever, chills, weight loss and diaphoresis.  HENT: Negative for congestion, ear discharge, ear pain, hearing loss, nosebleeds, sore throat and tinnitus.   Eyes: Negative for blurred vision, double vision, photophobia, pain, discharge and redness.  Respiratory: Negative for cough, hemoptysis, sputum production, shortness of breath, wheezing and stridor.   Cardiovascular: Negative for chest pain, palpitations, orthopnea, claudication, leg swelling and PND.  Gastrointestinal: Negative for heartburn, nausea, vomiting, abdominal pain, diarrhea, constipation, blood in stool and melena.  Genitourinary: Negative.   Musculoskeletal: Negative.   Skin: Negative.   Neurological: Negative for dizziness, tingling, focal weakness, seizures, weakness and headaches.  Endo/Heme/Allergies: Does not bruise/bleed easily.  Psychiatric/Behavioral: Negative for  depression. The patient is not nervous/anxious and does not have insomnia.     As per HPI. Otherwise, a complete review of systems is negatve.  ONCOLOGY HISTORY:  No history exists.    PAST MEDICAL HISTORY: Past Medical History  Diagnosis Date  . Asthma   . Blood dyscrasia     ITP   dx in 1990  . Arthritis     PAST SURGICAL HISTORY: Past Surgical History  Procedure Laterality Date  . Spleenectomy      x 2  . Tubal ligation    . Ctr      right hand  . 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 Total hip arthroplasty Left 05/15/2014    Procedure: TOTAL HIP ARTHROPLASTY ANTERIOR APPROACH;  Surgeon: Harvie Junior, MD;  Location: [facility] OR;  Service: Orthopedics;  Laterality: Left;    FAMILY HISTORY No family history on file.  GYNECOLOGIC HISTORY:  No LMP recorded. Patient is postmenopausal.     ADVANCED DIRECTIVES:    HEALTH MAINTENANCE: Social History  Substance Use Topics  . Smoking status: Never Smoker   . Smokeless tobacco: Never Used  . Alcohol Use: Yes     Comment: occasionally     Colonoscopy:  PAP:  Bone density:  Lipid panel:  Allergies  Allergen Reactions  . Nsaids Rash    Contraindicated by ITP, per Dr. Lorre Nick  . Aspirin     History of low platelets  . Citrus     itch    Current Outpatient Prescriptions  Medication Sig Dispense Refill  . acetaminophen (TYLENOL ARTHRITIS PAIN) 650 MG CR tablet Take  650 mg by mouth 2 (two) times daily as needed for pain.    . calcium carbonate (OS-CAL - DOSED IN MG OF ELEMENTAL CALCIUM) 1250 (500 CA) MG tablet Take by mouth.    . Calcium Carbonate-Vitamin D (CALCIUM + D PO) Take 1 tablet by mouth daily.    . Cetirizine HCl 10 MG CAPS Take by mouth.    . furosemide (LASIX) 40 MG tablet Take by mouth.    Marland Kitchen GARCINIA  CAMBOGIA-CHROMIUM PO Take 1 tablet by mouth daily.    . Melatonin 10 MG CAPS Take 10 mg by mouth at bedtime as needed.     . Multiple Vitamin (MULTIVITAMIN WITH MINERALS) TABS tablet Take 1 tablet by mouth daily.    . Naphazoline HCl (CLEAR EYES OP) Place 1 drop into both eyes daily.    . NON FORMULARY Use as directed 1 scoop in the mouth or throat daily. IT WORKS! (The Greens) pt mixes in water or fruit juice    . predniSONE (DELTASONE) 10 MG tablet Take 1 tablet (10 mg total) by mouth daily with breakfast. 30 tablet 0  . methocarbamol (ROBAXIN-750) 750 MG tablet Take 1 tablet (750 mg total) by mouth every 8 (eight) hours as needed for muscle spasms. (Patient not taking: Reported on 03/20/2015) 50 tablet 0  . oxyCODONE-acetaminophen (PERCOCET/ROXICET) 5-325 MG per tablet Take 1-2 tablets by mouth every 6 (six) hours as needed for severe pain. (Patient not taking: Reported on 03/20/2015) 60 tablet 0   No current facility-administered medications for this visit.    OBJECTIVE: BP 143/72 mmHg  Pulse 58  Temp(Src) 96.1 F (35.6 C) (Tympanic)  Wt 234 lb 2.1 oz (106.2 kg)   Body mass index is 38.96 kg/(m^2).    ECOG FS:0 - Asymptomatic  General: Well-developed, well-nourished, no acute distress. Fatigued Eyes: Pink conjunctiva, anicteric sclera. HEENT: Normocephalic, moist mucous membranes, clear oropharnyx. Lungs: Clear to auscultation bilaterally. Heart: Regular rate and rhythm. No rubs, murmurs, or gallops. Abdomen: Soft, nontender, nondistended. No organomegaly noted, normoactive bowel sounds. Musculoskeletal: No edema, cyanosis, or clubbing. Neuro: Alert, answering all questions appropriately. Cranial nerves grossly intact. Skin: No rashes or petechiae noted. Psych: Normal affect.    LAB RESULTS:  No visits with results within 3 Day(s) from this visit. Latest known visit with results is:  Appointment on 03/20/2015  Component Date Value Ref Range Status  . Platelets 03/20/2015 37*  150 - 440 K/uL Final    STUDIES: No results found.  ASSESSMENT:  ITP  PLAN:   1. ITP. Per notes from Dr. Lorre Nick he has recommended a platelet level of greater than 60,000 if possible. Patient is agreeable to trying a 4 week long steroid dose of 10 mg per day to see if that will increase her platelets. Due to previous side effect prednisone was discontinued, lower extremity edema was main complaint. Patient also feeling very fatigued. Advised patient that we would write an excuse off from work for 1 week. She is interested in attempting to receive the Rituxan 350 mg/m weekly treatments 4. She is requested by obtaining financial information regarding this. She will continue prednisone daily until she returns on September 27.  Patient expressed understanding and was in agreement with this plan. She also understands that She can call clinic at any time with any questions, concerns, or complaints.   Dr. Orlie Dakin was available for consultation and review of plan of care for this patient.   Loann Quill, NP   04/03/2015 12:04 PM

## 2015-04-10 ENCOUNTER — Other Ambulatory Visit: Payer: Self-pay

## 2015-04-10 ENCOUNTER — Ambulatory Visit: Payer: Self-pay | Admitting: Family Medicine

## 2015-05-01 ENCOUNTER — Ambulatory Visit: Payer: 59

## 2015-05-01 ENCOUNTER — Other Ambulatory Visit: Payer: 59

## 2015-05-01 ENCOUNTER — Inpatient Hospital Stay (HOSPITAL_BASED_OUTPATIENT_CLINIC_OR_DEPARTMENT_OTHER): Payer: 59 | Admitting: Internal Medicine

## 2015-05-01 ENCOUNTER — Inpatient Hospital Stay: Payer: 59 | Attending: Family Medicine

## 2015-05-01 ENCOUNTER — Encounter: Payer: Self-pay | Admitting: Internal Medicine

## 2015-05-01 VITALS — BP 127/72 | HR 72 | Temp 97.3°F | Resp 18 | Ht 65.0 in | Wt 240.5 lb

## 2015-05-01 DIAGNOSIS — D693 Immune thrombocytopenic purpura: Secondary | ICD-10-CM | POA: Insufficient documentation

## 2015-05-01 DIAGNOSIS — Z23 Encounter for immunization: Secondary | ICD-10-CM | POA: Diagnosis not present

## 2015-05-01 DIAGNOSIS — D696 Thrombocytopenia, unspecified: Secondary | ICD-10-CM

## 2015-05-01 DIAGNOSIS — Z79899 Other long term (current) drug therapy: Secondary | ICD-10-CM

## 2015-05-01 HISTORY — DX: Immune thrombocytopenic purpura: D69.3

## 2015-05-01 LAB — PLATELET COUNT: Platelets: 47 10*3/uL — ABNORMAL LOW (ref 150–440)

## 2015-05-01 MED ORDER — INFLUENZA VAC SPLIT QUAD 0.5 ML IM SUSY
0.5000 mL | PREFILLED_SYRINGE | Freq: Once | INTRAMUSCULAR | Status: AC
  Administered 2015-05-01: 0.5 mL via INTRAMUSCULAR

## 2015-05-01 NOTE — Assessment & Plan Note (Signed)
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 I reviewed the multiple treatment options available for ITP including# continued steroids/patient not interested #2 rituximab infusion patient had prior response to therapy; which might offer her some interval of remission #3 N-plate- she had prior response to this about 4 years ago; however this is a ongoing therapy; and she has come to the clinic for visits each week. #4 PROMACTA once a day; I described the mechanism of action in detail that it stimulates the bone marrow to improve platelet counts. I also discussed the potential multiple drug interactions; and also problems with LFTs; and concern for bone marrow fibrosis [FDA has recently pulled off the warning].   After lengthy discussion; and discussing pros and cons of each therapy- patient leaning towards Promacta. She  will be given a prescription for Promacta 50 mg a day; monitor her blood counts on a weekly basis; follow-up in 4 weeks with CBC/CMP.

## 2015-05-01 NOTE — Progress Notes (Signed)
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 I reviewed the patient's prior chart/pertinent labs/imaging in detail; findings are summarized.  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  Patient denies any bloody nose or bloody gums or petechial rash. Denies any other mucosal bleeding.  Patient complains of weight gain on steroids; increased appetite. And also intermittent swelling of the legs. Denies any difficulty swallowing or pain with swallowing.   REVIEW OF SYSTEMS:  A complete 10 point review of system is done with his negative except mentioned above in history of present illness.   I have reviewed the past medical history, past surgical history, social history and family history with the patient and they are unchanged from previous note unless stated above.  ALLERGIES:  is allergic to nsaids; aspirin; and citrus.  MEDICATIONS:  Current Outpatient Prescriptions  Medication Sig Dispense Refill  . calcium carbonate (OS-CAL - DOSED IN MG OF ELEMENTAL CALCIUM) 1250 (500 CA) MG tablet Take 1 tablet by mouth daily with breakfast.     . Calcium Carbonate-Vitamin D (CALCIUM + D PO) Take 1 tablet by mouth daily.    . furosemide (LASIX) 40 MG tablet Take 40 mg by mouth daily.     Marland Kitchen GARCINIA CAMBOGIA-CHROMIUM PO Take 1 tablet by mouth daily.    . Melatonin 10 MG CAPS Take 10 mg by mouth at bedtime as needed.     . Multiple Vitamin (MULTIVITAMIN  WITH MINERALS) TABS tablet Take 1 tablet by mouth daily.    . Multiple Vitamins-Minerals (OCUVITE EYE + MULTI PO) Take 1 tablet by mouth once.    . Naphazoline HCl (CLEAR EYES OP) Place 1 drop into both eyes daily.    . NON FORMULARY Use as directed 1 scoop in the mouth or throat daily. IT WORKS! (The Greens) pt mixes in water or fruit juice    . predniSONE (DELTASONE) 10 MG tablet Take 1 tablet (10 mg total) by mouth daily with breakfast. 30 tablet 0  . acetaminophen (TYLENOL ARTHRITIS PAIN) 650 MG CR tablet Take 650 mg by mouth 2 (two) times daily as needed for pain.    . Cetirizine HCl 10 MG CAPS Take 1 capsule by mouth as needed. Seasonal allergies     Current Facility-Administered Medications  Medication Dose Route Frequency Provider Last Rate Last Dose  . Influenza vac split quadrivalent PF (FLUARIX) injection 0.5 mL  0.5 mL Intramuscular Once Earna Coder, MD        PHYSICAL EXAMINATION:   BP 127/72 mmHg  Pulse 72  Temp(Src) 97.3 F (36.3 C) (Tympanic)  Resp 18  Ht  (1.651 m)  Wt 240 lb 8.3 oz (109.099 kg)  BMI 40.02 kg/m2  Filed Weights   05/01/15 1414  Weight: 240 lb 8.3 oz (109.099 kg)    GENERAL:alert, no distress and comfortable. Patient alone. She is obese. EYES: normal, Conjunctiva are pink and non-injected, sclera clear OROPHARYNX:no exudate, no erythema and lips, buccal mucosa, and tongue normal  NECK: No thyromegaly LYMPH:  no palpable lymphadenopathy in the cervical, axillary or inguinal LUNGS: clear to auscultation with normal breathing effort; No Wheeze or crackles  Cardio-vascular- Regular Rate & Rythm and no murmurs and no lower extremity edema ABDOMEN:abdomen soft, non-tender and normal bowel sounds; No hepato-splenomegaly.  Musculoskeletal:no cyanosis of digits and no clubbing  NEURO: alert & oriented x 3 with fluent speech, no focal motor/sensory deficits. SKIN: no skin rash   LABORATORY DATA:  I have reviewed the data as listed     Component Value Date/Time   NA 143 05/16/2014 0547   NA 142 03/30/2012 1120   K 3.7 05/16/2014 0547   K 3.9 03/30/2012 1120   CL 106 05/16/2014 0547   CL 106 03/30/2012 1120   CO2 27 05/16/2014 0547   CO2 28 03/30/2012 1120   GLUCOSE 86 05/16/2014 0547   GLUCOSE 119* 03/30/2012 1120   BUN 11 05/16/2014 0547   BUN 16 03/30/2012 1120   CREATININE 0.90 05/16/2014 0547   CREATININE 1.13 03/30/2012 1120   CALCIUM 8.3* 05/16/2014 0547   CALCIUM 9.4 03/30/2012 1120   PROT 7.2 05/02/2014 0947   PROT 7.2 03/30/2012 1120   ALBUMIN 4.1 05/02/2014 0947   ALBUMIN 3.9 03/30/2012 1120   AST 29 05/02/2014 0947   AST 28 03/30/2012 1120   ALT 26 05/02/2014 0947   ALT 47 03/30/2012 1120   ALKPHOS 97 05/02/2014 0947   ALKPHOS 105 03/30/2012 1120   BILITOT 0.4 05/02/2014 0947   BILITOT 0.4 03/30/2012 1120   GFRNONAA 70* 05/16/2014 0547   GFRNONAA 55* 03/30/2012 1120   GFRAA 81* 05/16/2014 0547   GFRAA >60 03/30/2012 1120    No results found for: SPEP, UPEP  Lab Results  Component Value Date   WBC 13.4* 05/17/2014   NEUTROABS 3.7 05/02/2014   HGB 9.6* 05/17/2014   HCT 28.0* 05/17/2014   MCV 86.4 05/17/2014   PLT 47* 05/01/2015      Chemistry      Component Value Date/Time   NA 143 05/16/2014 0547   NA 142 03/30/2012 1120   K 3.7 05/16/2014 0547   K 3.9 03/30/2012 1120   CL 106 05/16/2014 0547   CL 106 03/30/2012 1120   CO2 27 05/16/2014 0547   CO2 28 03/30/2012 1120   BUN 11 05/16/2014 0547   BUN 16 03/30/2012 1120   CREATININE 0.90 05/16/2014 0547   CREATININE 1.13 03/30/2012 1120      Component Value Date/Time   CALCIUM 8.3* 05/16/2014 0547   CALCIUM 9.4 03/30/2012 1120   ALKPHOS 97 05/02/2014 0947   ALKPHOS 105 03/30/2012 1120   AST 29 05/02/2014 0947   AST 28 03/30/2012 1120   ALT 26 05/02/2014 0947   ALT 47 03/30/2012 1120   BILITOT 0.4 05/02/2014 0947   BILITOT 0.4 03/30/2012 1120        ASSESSMENT & PLAN:  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 I reviewed the multiple treatment options available for ITP including# continued steroids/patient not interested #2 rituximab infusion patient had prior response to therapy; which might offer her some interval of remission #3 N-plate- she had prior response to this about 4 years ago; however this is a ongoing therapy; and she has come to the clinic for visits each week. #4 PROMACTA once a day; I described the mechanism of action in detail that it stimulates the bone marrow to improve platelet counts. I also  discussed the potential multiple drug interactions; and also problems with LFTs; and concern for bone marrow fibrosis [FDA has recently pulled off the warning].   After lengthy discussion; and discussing pros and cons of each therapy- patient leaning towards Promacta. She  will be given a prescription for Promacta 50 mg a day; monitor her blood counts on a weekly basis; follow-up in 4 weeks with CBC/CMP.    # patient also received her flu shot for this season.  Orders Placed This Encounter  Procedures  . CBC with Differential/Platelet    Every week x4    Standing Status: Standing     Number of Occurrences: 4     Standing Expiration Date: 04/30/2016  . Comprehensive metabolic panel    Standing Status: Future     Number of Occurrences:      Standing Expiration Date: 04/30/2016   All questions were answered. The patient knows to call the clinic with any problems, questions or concerns. No barriers to learning was detected & I spent 25 minutes counseling the patient face to face. The total time spent in the appointment was 40 minutes and more than 50% was on counseling and review of test results     Earna Coder, MD 05/01/2015 3:14 PM

## 2015-05-01 NOTE — Patient Instructions (Signed)
Promacta Eltrombopag tablets  What is this medicine? ELTROMBOPAG (el TROM boe pag) helps your body make more platelets. It is used to treat low platelets caused by chronic immune (idiopathic) thrombocytopenic purpura (ITP) or chronic hepatitis C infection. It is also used in patients with severe aplastic anemia when other medicines have not worked well enough. This medicine may be used for other purposes; ask your health care provider or pharmacist if you have questions.  COMMON BRAND NAME(S): Promacta  What should I tell my health care provider before I take this medicine? They need to know if you have any of these conditions: -history of blood clots -eye disease, vision problems -kidney disease -liver disease -low blood counts, like low white cell, platelet, or red cell counts -have had your spleen removed -an unusual or allergic reaction to Eltrombopag, other medicines, foods, dyes, or preservatives -pregnant or trying to get pregnant -breast-feeding  How should I use this medicine? Take this medicine by mouth with a glass of water. Follow the directions on the prescription label. Take this medicine on an empty stomach, at least 1 hour before or 2 hours after food. Do not take with food. Avoid antacids, aluminum, calcium, iron, magnesium, selenium, and zinc products for 4 hours before and 4 hours after taking your dose. Take your medicine at regular intervals. Do not take it more often than directed. Do not stop taking except on your doctor's advice.  Avoid all dairy products or products that contain calcium (including fortified fruit juice) for at least 4 hours before or after you take Promacta.  A special MedGuide will be given to you by the pharmacist with each prescription and refill. Be sure to read this information carefully each time. Talk to your pediatrician regarding the use of this medicine in children. Special care may be needed. Overdosage: If you think you have taken too  much of this medicine contact a poison control center or emergency room at once. NOTE: This medicine is only for you. Do not share this medicine with others.  What if I miss a dose? If you miss a dose, wait and take your next scheduled dose. Do not take more than 1 dose in 1 day. If it is almost time for your next dose, take only that dose. Do not take double or extra doses.  What may interact with this medicine? -acetaminophen -antacids -benzylpenicillin -calcium supplements -certain medicines for cholesterol like atorvastatin, fluvastatin, pravastatin, rosuvastatin -ciprofloxacin -fluvoxamine -gemfibrozil -iron supplements -magnesium supplements -methotrexate -multivitamins with minerals -nateglinide -narcotic medicines for pain -NSAIDS, medicines for pain and inflammation, like ibuprofen or naproxen -omeprazole -repaglinide -rifampin -selenium -tobacco -trimethoprim -zinc This list may not describe all possible interactions. Give your health care provider a list of all the medicines, herbs, non-prescription drugs, or dietary supplements you use. Also tell them if you smoke, drink alcohol, or use illegal drugs. Some items may interact with your medicine.  What should I watch for while using this medicine? Your condition will be monitored carefully while you are receiving this medicine. To receive this medicine, you, your doctor and your pharmacy must be registered in the [facility] program. Visit your prescriber or health care professional for regular checks on your progress and for the needed blood tests. It is important to keep all appointments.  What side effects may I notice from receiving this medicine? Side effects that you should report to your doctor or health care professional as soon as possible: -allergic reactions like skin rash, itching or  hives, swelling of the face, lips, or tongue -changes in vision -dark urine -general ill feeling or flu-like  symptoms -light-colored stools -loss of appetite, nausea -right upper belly pain -shortness of breath, chest pain, swelling in a leg -unusual bleeding or bruising -unusually weak or tired -yellowing of the eyes or skin Side effects that usually do not require medical attention (report to your doctor or health care professional if they continue or are bothersome): -constipation -dry mouth -headache -muscle aches This list may not describe all possible side effects. Call your doctor for medical advice about side effects. You may report side effects to FDA at 1-800-FDA-1088.  Where should I keep my medicine? Keep out of the reach of children. Store at room temperature between 15 and 30 degrees C (59 and 86 degrees F). Throw away any unused medicine after the expiration date. NOTE: This sheet is a summary. It may not cover all possible information. If you have questions about this medicine, talk to your doctor, pharmacist, or health care provider.  2015, Elsevier/Gold Standard. (2013-04-06 15:29:25)

## 2015-05-02 NOTE — Progress Notes (Signed)
RX for Promacta faxed to [facility].

## 2015-05-07 ENCOUNTER — Telehealth: Payer: Self-pay | Admitting: Oncology

## 2015-05-07 NOTE — Telephone Encounter (Signed)
She said she got approved for the copay program for Promacta and that it is an $8k med. She said you guy still need to get it approved through her insurance. Please call her: [phone]. Thanks.

## 2015-05-07 NOTE — Telephone Encounter (Signed)
Patient saw Dr. Donneta Romberg, forwarded to his team

## 2015-05-08 ENCOUNTER — Other Ambulatory Visit: Payer: 59

## 2015-05-08 ENCOUNTER — Ambulatory Visit: Payer: 59

## 2015-05-08 ENCOUNTER — Ambulatory Visit: Payer: 59 | Admitting: Internal Medicine

## 2015-05-09 ENCOUNTER — Inpatient Hospital Stay: Payer: 59

## 2015-05-09 DIAGNOSIS — D693 Immune thrombocytopenic purpura: Secondary | ICD-10-CM

## 2015-05-09 LAB — CBC WITH DIFFERENTIAL/PLATELET
Basophils Absolute: 0.1 10*3/uL (ref 0–0.1)
EOS ABS: 0.2 10*3/uL (ref 0–0.7)
HCT: 38.3 % (ref 35.0–47.0)
Hemoglobin: 13 g/dL (ref 12.0–16.0)
LYMPHS ABS: 2.2 10*3/uL (ref 1.0–3.6)
Lymphocytes Relative: 33 %
MCH: 30.6 pg (ref 26.0–34.0)
MCHC: 33.9 g/dL (ref 32.0–36.0)
MCV: 90.2 fL (ref 80.0–100.0)
MONO ABS: 0.6 10*3/uL (ref 0.2–0.9)
Neutro Abs: 3.6 10*3/uL (ref 1.4–6.5)
Neutrophils Relative %: 54 %
Platelets: 54 10*3/uL — ABNORMAL LOW (ref 150–440)
RBC: 4.25 MIL/uL (ref 3.80–5.20)
RDW: 14.4 % (ref 11.5–14.5)
WBC: 6.7 10*3/uL (ref 3.6–11.0)

## 2015-05-09 LAB — COMPREHENSIVE METABOLIC PANEL
ALBUMIN: 3.9 g/dL (ref 3.5–5.0)
ALK PHOS: 68 U/L (ref 38–126)
ALT: 25 U/L (ref 14–54)
ANION GAP: 12 (ref 5–15)
AST: 24 U/L (ref 15–41)
BUN: 13 mg/dL (ref 6–20)
CALCIUM: 9.1 mg/dL (ref 8.9–10.3)
CO2: 22 mmol/L (ref 22–32)
Chloride: 106 mmol/L (ref 101–111)
Creatinine, Ser: 0.99 mg/dL (ref 0.44–1.00)
GFR calc non Af Amer: >60 mL/min (ref >60)
GLUCOSE: 104 mg/dL — AB (ref 65–99)
POTASSIUM: 3.5 mmol/L (ref 3.5–5.1)
SODIUM: 140 mmol/L (ref 135–145)
TOTAL PROTEIN: 6.8 g/dL (ref 6.5–8.1)
Total Bilirubin: 1 mg/dL (ref 0.3–1.2)

## 2015-05-11 ENCOUNTER — Encounter: Payer: Self-pay | Admitting: *Deleted

## 2015-05-11 NOTE — Progress Notes (Signed)
Received fax from [facility]. Val Riles has been approved for coverage until 05/06/2020.

## 2015-05-15 ENCOUNTER — Other Ambulatory Visit
Admission: RE | Admit: 2015-05-15 | Discharge: 2015-05-15 | Disposition: A | Discharge status: Home / Self Care | Payer: 59 | Source: Ambulatory Visit | Attending: Internal Medicine | Admitting: Internal Medicine

## 2015-05-15 DIAGNOSIS — Z029 Encounter for administrative examinations, unspecified: Secondary | ICD-10-CM | POA: Insufficient documentation

## 2015-05-15 LAB — CBC WITH DIFFERENTIAL/PLATELET
BASOS ABS: 0 10*3/uL (ref 0–0.1)
Eosinophils Absolute: 0.1 10*3/uL (ref 0–0.7)
HEMATOCRIT: 37.6 % (ref 35.0–47.0)
Hemoglobin: 12.8 g/dL (ref 12.0–16.0)
Lymphs Abs: 1.7 10*3/uL (ref 1.0–3.6)
MCH: 31.1 pg (ref 26.0–34.0)
MCHC: 34 g/dL (ref 32.0–36.0)
MCV: 91.7 fL (ref 80.0–100.0)
MONO ABS: 0.6 10*3/uL (ref 0.2–0.9)
NEUTROS ABS: 5.7 10*3/uL (ref 1.4–6.5)
Neutrophils Relative %: 69 %
PLATELETS: 54 10*3/uL — AB (ref 150–440)
RBC: 4.1 MIL/uL (ref 3.80–5.20)
RDW: 14.5 % (ref 11.5–14.5)
WBC: 8.2 10*3/uL (ref 3.6–11.0)

## 2015-05-17 ENCOUNTER — Telehealth: Payer: Self-pay | Admitting: *Deleted

## 2015-05-17 NOTE — Telephone Encounter (Signed)
Left message to make sure patient was tolerating the promatca. Asked patient to call the cancer center back.

## 2015-05-21 NOTE — Telephone Encounter (Signed)
Patient states that she started the RX on Monday. She has not had any side effects to the medications at this time. She states that she is tolerating the drug well.

## 2015-05-29 ENCOUNTER — Telehealth: Payer: Self-pay | Admitting: *Deleted

## 2015-05-29 NOTE — Telephone Encounter (Signed)
Called. Reminded patient regarding [facility] with Dr. Donneta RombergBrahmanday to reassess her toleration of her promacta.

## 2015-05-30 ENCOUNTER — Inpatient Hospital Stay: Payer: 59

## 2015-05-30 ENCOUNTER — Inpatient Hospital Stay: Payer: 59 | Attending: Internal Medicine | Admitting: Internal Medicine

## 2015-05-30 ENCOUNTER — Encounter: Payer: Self-pay | Admitting: Internal Medicine

## 2015-05-30 DIAGNOSIS — D693 Immune thrombocytopenic purpura: Secondary | ICD-10-CM | POA: Diagnosis present

## 2015-05-30 DIAGNOSIS — Z79899 Other long term (current) drug therapy: Secondary | ICD-10-CM | POA: Diagnosis not present

## 2015-05-30 DIAGNOSIS — D696 Thrombocytopenia, unspecified: Secondary | ICD-10-CM

## 2015-05-30 LAB — CBC WITH DIFFERENTIAL/PLATELET
Basophils Absolute: 0.1 10*3/uL (ref 0–0.1)
Basophils Relative: 1 %
EOS PCT: 4 %
Eosinophils Absolute: 0.4 10*3/uL (ref 0–0.7)
HCT: 37.5 % (ref 35.0–47.0)
Hemoglobin: 12.7 g/dL (ref 12.0–16.0)
LYMPHS ABS: 1.9 10*3/uL (ref 1.0–3.6)
LYMPHS PCT: 22 %
MCH: 30.5 pg (ref 26.0–34.0)
MCHC: 34 g/dL (ref 32.0–36.0)
MCV: 89.8 fL (ref 80.0–100.0)
MONO ABS: 0.7 10*3/uL (ref 0.2–0.9)
Monocytes Relative: 8 %
Neutro Abs: 5.7 10*3/uL (ref 1.4–6.5)
Neutrophils Relative %: 65 %
PLATELETS: 361 10*3/uL (ref 150–440)
RBC: 4.18 MIL/uL (ref 3.80–5.20)
RDW: 13.9 % (ref 11.5–14.5)
WBC: 8.7 10*3/uL (ref 3.6–11.0)

## 2015-05-30 NOTE — Progress Notes (Signed)
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 She stopped taking prednisone approximately 2 weeks ago; and started Promacta around the same time.    She had intermittent wheezing which is currently improved.  Patient denies any bloody nose or bloody gums or petechial rash. Denies any other mucosal bleeding. Patient complains of weight gain on steroids; increased appetite. And also intermittent swelling of the legs. Denies any difficulty swallowing or pain with swallowing.   REVIEW OF SYSTEMS:  A complete 10 point review of system is done with his negative except mentioned above in history of present illness.   I have reviewed the past medical history, past surgical history, social history and family history with the patient and they are unchanged from previous note unless stated above.  ALLERGIES:  is allergic to nsaids; aspirin; and citrus.  MEDICATIONS:  Current Outpatient Prescriptions  Medication Sig Dispense Refill  . acetaminophen (TYLENOL ARTHRITIS PAIN) 650 MG CR tablet Take 650 mg by mouth 2 (two) times daily as needed for pain.    . Calcium Carbonate-Vitamin D (CALCIUM + D PO) Take 1 tablet by mouth daily.    . Cetirizine HCl 10 MG CAPS Take 1 capsule by mouth as needed. Seasonal allergies    . furosemide (LASIX) 40 MG tablet Take 40 mg by mouth daily.     Marland Kitchen. GARCINIA CAMBOGIA-CHROMIUM PO Take 1 tablet by mouth daily.    . Melatonin 10 MG CAPS Take 10 mg by mouth at bedtime as needed.     . Multiple Vitamin (MULTIVITAMIN WITH  MINERALS) TABS tablet Take 1 tablet by mouth daily.    . Multiple Vitamins-Minerals (OCUVITE EYE + MULTI PO) Take 1 tablet by mouth once.    . Naphazoline HCl (CLEAR EYES OP) Place 1 drop into both eyes daily.    . NON FORMULARY Use as directed 1 scoop in the mouth or throat daily. IT WORKS! (The Greens) pt mixes in water or fruit juice     No current facility-administered medications for this visit.    PHYSICAL EXAMINATION:   There were no vitals taken for this visit.  There were no vitals filed for this visit.  GENERAL:alert, no distress and comfortable. Patient alone. She is obese. EYES: normal, Conjunctiva are pink and non-injected, sclera clear OROPHARYNX:no exudate, no erythema and lips, buccal mucosa, and tongue normal  NECK: No thyromegaly LYMPH:  no palpable lymphadenopathy in the cervical, axillary or inguinal LUNGS: clear to auscultation with normal breathing effort; No Wheeze or crackles  Cardio-vascular- Regular Rate & Rythm and no murmurs and no lower extremity edema ABDOMEN:abdomen soft, non-tender and normal bowel sounds; No hepato-splenomegaly.  Musculoskeletal:no cyanosis of digits and no clubbing  NEURO: alert & oriented x 3 with fluent speech, no focal motor/sensory deficits. SKIN: no skin rash   LABORATORY DATA:  I have reviewed the data as listed    Component Value Date/Time   NA 140 05/09/2015 1202   NA 142 03/30/2012 1120   K 3.5 05/09/2015 1202   K 3.9 03/30/2012 1120   CL 106 05/09/2015 1202   CL 106 03/30/2012  1120   CO2 22 05/09/2015 1202   CO2 28 03/30/2012 1120   GLUCOSE 104* 05/09/2015 1202   GLUCOSE 119* 03/30/2012 1120   BUN 13 05/09/2015 1202   BUN 16 03/30/2012 1120   CREATININE 0.99 05/09/2015 1202   CREATININE 1.13 03/30/2012 1120   CALCIUM 9.1 05/09/2015 1202   CALCIUM 9.4 03/30/2012 1120   PROT 6.8 05/09/2015 1202   PROT 7.2 03/30/2012 1120   ALBUMIN 3.9 05/09/2015 1202   ALBUMIN 3.9 03/30/2012 1120   AST 24 05/09/2015 1202    AST 28 03/30/2012 1120   ALT 25 05/09/2015 1202   ALT 47 03/30/2012 1120   ALKPHOS 68 05/09/2015 1202   ALKPHOS 105 03/30/2012 1120   BILITOT 1.0 05/09/2015 1202   BILITOT 0.4 03/30/2012 1120   GFRNONAA >60 05/09/2015 1202   GFRNONAA 55* 03/30/2012 1120   GFRAA >60 05/09/2015 1202   GFRAA >60 03/30/2012 1120    No results found for: SPEP, UPEP  Lab Results  Component Value Date   WBC 8.7 05/30/2015   NEUTROABS 5.7 05/30/2015   HGB 12.7 05/30/2015   HCT 37.5 05/30/2015   MCV 89.8 05/30/2015   PLT 361 05/30/2015      Chemistry      Component Value Date/Time   NA 140 05/09/2015 1202   NA 142 03/30/2012 1120   K 3.5 05/09/2015 1202   K 3.9 03/30/2012 1120   CL 106 05/09/2015 1202   CL 106 03/30/2012 1120   CO2 22 05/09/2015 1202   CO2 28 03/30/2012 1120   BUN 13 05/09/2015 1202   BUN 16 03/30/2012 1120   CREATININE 0.99 05/09/2015 1202   CREATININE 1.13 03/30/2012 1120      Component Value Date/Time   CALCIUM 9.1 05/09/2015 1202   CALCIUM 9.4 03/30/2012 1120   ALKPHOS 68 05/09/2015 1202   ALKPHOS 105 03/30/2012 1120   AST 24 05/09/2015 1202   AST 28 03/30/2012 1120   ALT 25 05/09/2015 1202   ALT 47 03/30/2012 1120   BILITOT 1.0 05/09/2015 1202   BILITOT 0.4 03/30/2012 1120        ASSESSMENT & PLAN:   Chronic refractory ITP status post multiple lines of therapy; steroid responsive but intolerant. Patient on Promacta for the last 2 weeks since October 2016; patient's platelets up today to 326. She had a great response. I would continue checking labs on a monthly basis.   # Patient had some issues regarding co-pay assistance; I have asked her to check with her co-pay assistance- and if needed we could do the Promacta 3-4 times a week rather than once a day. She understands this is unusual; however is could be tried if she had issues with co-pay assistance/ and the platelets are adequately controlled.  # Patient will follow-up with me in 3 months with labs; will  get monthly labs. She was given a copy of her labs. No orders of the defined types were placed in this encounter.   All questions were answered. The patient knows to call the clinic with any problems, questions or concerns. No barriers to learning was detected & I spent 25 minutes counseling the patient face to face. The total time spent in the appointment was 40 minutes and more than 50% was on counseling and review of test results     Earna Coder, MD 05/30/2015 12:34 PM

## 2015-05-30 NOTE — Progress Notes (Signed)
She is doing well, no c/o while on promacta. She finished the prednisone.

## 2015-06-01 ENCOUNTER — Telehealth: Payer: Self-pay | Admitting: *Deleted

## 2015-06-01 ENCOUNTER — Encounter: Payer: Self-pay | Admitting: *Deleted

## 2015-06-01 NOTE — Telephone Encounter (Signed)
Unable to understand message this morning, I called and left message for her to please call me back. I have not heard anyhting and have attempted calling again and still get vm

## 2015-06-11 ENCOUNTER — Telehealth: Payer: Self-pay | Admitting: *Deleted

## 2015-06-11 NOTE — Telephone Encounter (Signed)
The patient was given a 30 day's supply and 4 RFs at the time of her prescription to ensure that she could tolerate the drug. We can renew the RX through [facility] in the next month.

## 2015-06-11 NOTE — Telephone Encounter (Signed)
Called to repo that she got her Promacta 50 mg filled today and she has 3 more refills left on her current prescription

## 2015-06-26 ENCOUNTER — Telehealth: Payer: Self-pay | Admitting: Internal Medicine

## 2015-06-26 ENCOUNTER — Inpatient Hospital Stay: Payer: 59 | Attending: Internal Medicine

## 2015-06-26 DIAGNOSIS — D693 Immune thrombocytopenic purpura: Secondary | ICD-10-CM | POA: Insufficient documentation

## 2015-06-26 DIAGNOSIS — Z79899 Other long term (current) drug therapy: Secondary | ICD-10-CM | POA: Insufficient documentation

## 2015-06-26 DIAGNOSIS — D696 Thrombocytopenia, unspecified: Secondary | ICD-10-CM

## 2015-06-26 LAB — HEPATIC FUNCTION PANEL
ALK PHOS: 86 U/L (ref 38–126)
ALT: 26 U/L (ref 14–54)
AST: 31 U/L (ref 15–41)
Albumin: 4.1 g/dL (ref 3.5–5.0)
TOTAL PROTEIN: 7.6 g/dL (ref 6.5–8.1)

## 2015-06-26 LAB — CBC WITH DIFFERENTIAL/PLATELET
Basophils Absolute: 0.2 10*3/uL — ABNORMAL HIGH (ref 0–0.1)
Basophils Relative: 2 %
EOS ABS: 0.5 10*3/uL (ref 0–0.7)
Eosinophils Relative: 6 %
HEMATOCRIT: 37.9 % (ref 35.0–47.0)
HEMOGLOBIN: 12.8 g/dL (ref 12.0–16.0)
LYMPHS ABS: 1.8 10*3/uL (ref 1.0–3.6)
LYMPHS PCT: 23 %
MCH: 30.6 pg (ref 26.0–34.0)
MCHC: 33.9 g/dL (ref 32.0–36.0)
MCV: 90.3 fL (ref 80.0–100.0)
MONOS PCT: 12 %
Monocytes Absolute: 0.9 10*3/uL (ref 0.2–0.9)
NEUTROS ABS: 4.3 10*3/uL (ref 1.4–6.5)
NEUTROS PCT: 57 %
Platelets: 519 10*3/uL — ABNORMAL HIGH (ref 150–440)
RBC: 4.2 MIL/uL (ref 3.80–5.20)
RDW: 14.1 % (ref 11.5–14.5)
WBC: 7.7 10*3/uL (ref 3.6–11.0)

## 2015-06-26 NOTE — Telephone Encounter (Signed)
Face to face discussion today with patient. She waited for her lab results. She was instructed to take Promacta every other day.

## 2015-06-26 NOTE — Telephone Encounter (Signed)
Patient's platelet count is 561.It is okay to take Promacta every other day. Continue labs on a monthly basis.  Heather please inform patient.

## 2015-06-27 ENCOUNTER — Other Ambulatory Visit: Payer: 59

## 2015-07-24 ENCOUNTER — Inpatient Hospital Stay: Payer: 59 | Attending: Internal Medicine

## 2015-07-24 DIAGNOSIS — Z79899 Other long term (current) drug therapy: Secondary | ICD-10-CM | POA: Insufficient documentation

## 2015-07-24 DIAGNOSIS — D696 Thrombocytopenia, unspecified: Secondary | ICD-10-CM

## 2015-07-24 DIAGNOSIS — D693 Immune thrombocytopenic purpura: Secondary | ICD-10-CM | POA: Insufficient documentation

## 2015-07-24 LAB — HEPATIC FUNCTION PANEL
ALBUMIN: 4 g/dL (ref 3.5–5.0)
ALK PHOS: 77 U/L (ref 38–126)
ALT: 26 U/L (ref 14–54)
AST: 30 U/L (ref 15–41)
Bilirubin, Direct: <0.1 mg/dL — ABNORMAL LOW (ref 0.1–0.5)
TOTAL PROTEIN: 7.2 g/dL (ref 6.5–8.1)
Total Bilirubin: 0.5 mg/dL (ref 0.3–1.2)

## 2015-07-24 LAB — CBC WITH DIFFERENTIAL/PLATELET
Basophils Absolute: 0.1 10*3/uL (ref 0–0.1)
Basophils Relative: 1 %
EOS ABS: 0.3 10*3/uL (ref 0–0.7)
Eosinophils Relative: 5 %
HCT: 38.2 % (ref 35.0–47.0)
HEMOGLOBIN: 12.8 g/dL (ref 12.0–16.0)
LYMPHS ABS: 1.6 10*3/uL (ref 1.0–3.6)
Lymphocytes Relative: 24 %
MCH: 30.4 pg (ref 26.0–34.0)
MCHC: 33.4 g/dL (ref 32.0–36.0)
MCV: 90.8 fL (ref 80.0–100.0)
Monocytes Absolute: 0.8 10*3/uL (ref 0.2–0.9)
Monocytes Relative: 12 %
NEUTROS PCT: 58 %
Neutro Abs: 3.8 10*3/uL (ref 1.4–6.5)
Platelets: 190 10*3/uL (ref 150–440)
RBC: 4.21 MIL/uL (ref 3.80–5.20)
RDW: 13.8 % (ref 11.5–14.5)
WBC: 6.6 10*3/uL (ref 3.6–11.0)

## 2015-07-25 ENCOUNTER — Other Ambulatory Visit: Payer: 59

## 2015-08-21 ENCOUNTER — Inpatient Hospital Stay (HOSPITAL_BASED_OUTPATIENT_CLINIC_OR_DEPARTMENT_OTHER): Payer: 59 | Admitting: Internal Medicine

## 2015-08-21 ENCOUNTER — Inpatient Hospital Stay: Payer: 59 | Attending: Internal Medicine

## 2015-08-21 VITALS — BP 140/76 | HR 75 | Temp 97.3°F | Wt 248.3 lb

## 2015-08-21 DIAGNOSIS — D693 Immune thrombocytopenic purpura: Secondary | ICD-10-CM

## 2015-08-21 DIAGNOSIS — Z79899 Other long term (current) drug therapy: Secondary | ICD-10-CM | POA: Insufficient documentation

## 2015-08-21 DIAGNOSIS — D696 Thrombocytopenia, unspecified: Secondary | ICD-10-CM

## 2015-08-21 LAB — CBC WITH DIFFERENTIAL/PLATELET
BASOS ABS: 0.1 10*3/uL (ref 0–0.1)
Basophils Relative: 2 %
Eosinophils Absolute: 0.2 10*3/uL (ref 0–0.7)
Eosinophils Relative: 4 %
HEMATOCRIT: 38 % (ref 35.0–47.0)
Hemoglobin: 12.9 g/dL (ref 12.0–16.0)
LYMPHS ABS: 1.7 10*3/uL (ref 1.0–3.6)
LYMPHS PCT: 26 %
MCH: 30.6 pg (ref 26.0–34.0)
MCHC: 33.9 g/dL (ref 32.0–36.0)
MCV: 90.4 fL (ref 80.0–100.0)
MONO ABS: 0.8 10*3/uL (ref 0.2–0.9)
Monocytes Relative: 12 %
NEUTROS ABS: 3.9 10*3/uL (ref 1.4–6.5)
Neutrophils Relative %: 56 %
Platelets: 169 10*3/uL (ref 150–440)
RBC: 4.2 MIL/uL (ref 3.80–5.20)
RDW: 13.6 % (ref 11.5–14.5)
WBC: 6.7 10*3/uL (ref 3.6–11.0)

## 2015-08-21 LAB — HEPATIC FUNCTION PANEL
ALT: 26 U/L (ref 14–54)
AST: 28 U/L (ref 15–41)
Albumin: 4.1 g/dL (ref 3.5–5.0)
Alkaline Phosphatase: 73 U/L (ref 38–126)
BILIRUBIN TOTAL: 0.6 mg/dL (ref 0.3–1.2)
Total Protein: 7.5 g/dL (ref 6.5–8.1)

## 2015-08-21 NOTE — Progress Notes (Signed)
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 Denies any other mucosal bleeding.   Continues to complain of weight gain. She has been off steroids for many months now. Intermittent chronic wheezing.  REVIEW OF SYSTEMS:  A complete 10 point review of system is done with his negative except mentioned above in history of present illness.   I have reviewed the past medical history, past surgical history, social history and family history with the patient and they are unchanged from previous note unless stated above.  ALLERGIES:  is allergic to nsaids; aspirin; and citrus.  MEDICATIONS:  Current Outpatient Prescriptions  Medication Sig Dispense Refill  . acetaminophen (TYLENOL ARTHRITIS PAIN) 650 MG CR tablet Take 650 mg by mouth 2 (two) times daily as needed for pain.    . calcium carbonate (OS-CAL - DOSED IN MG OF ELEMENTAL CALCIUM) 1250 (500 Ca) MG tablet Take by mouth.    . Calcium Carbonate-Vitamin D (CALCIUM + D PO) Take 1 tablet by mouth daily.    . Cetirizine HCl 10 MG CAPS Take 1 capsule by mouth as needed. Seasonal allergies    . eltrombopag (PROMACTA) 50 MG tablet Take 1 tablet (50 mg total) by mouth daily. Take on an empty stomach 1 hour before a meal or 2 hours after    . furosemide (LASIX) 40 MG tablet Take 40 mg by mouth daily.     Marland Kitchen. GARCINIA CAMBOGIA-CHROMIUM PO Take 1 tablet by mouth daily.    . Melatonin  10 MG CAPS Take 10 mg by mouth at bedtime as needed.     . Multiple Vitamin (MULTIVITAMIN WITH MINERALS) TABS tablet Take 1 tablet by mouth daily.    . Multiple Vitamins-Minerals (OCUVITE EYE + MULTI PO) Take 1 tablet by mouth once.    . Naphazoline HCl (CLEAR EYES OP) Place 1 drop into both eyes daily.    . NON FORMULARY Use as directed 1 scoop in the mouth or throat daily. IT WORKS! (The Greens) pt mixes in water or fruit juice     No current facility-administered medications for this visit.    PHYSICAL EXAMINATION:   BP 140/76 mmHg  Pulse 75  Temp(Src) 97.3 F (36.3 C) (Tympanic)  Wt 248 lb 5.6 oz (112.65 kg)  Filed Weights   08/21/15 0927  Weight: 248 lb 5.6 oz (112.65 kg)    GENERAL:alert, no distress and comfortable. Patient alone. She is obese. EYES: normal, Conjunctiva are pink and non-injected, sclera clear OROPHARYNX:no exudate, no erythema and lips, buccal mucosa, and tongue normal  NECK: No thyromegaly LYMPH:  no palpable lymphadenopathy in the cervical, axillary or inguinal LUNGS: clear to auscultation with normal breathing effort; No Wheeze or crackles  Cardio-vascular- Regular Rate & Rythm and no murmurs and no lower extremity edema ABDOMEN:abdomen soft, non-tender and normal bowel sounds; No hepato-splenomegaly.  Musculoskeletal:no cyanosis of digits and no clubbing  NEURO: alert & oriented x 3 with fluent speech, no focal motor/sensory deficits.  SKIN: no skin rash   LABORATORY DATA:  I have reviewed the data as listed    Component Value Date/Time   NA 140 05/09/2015 1202   NA 142 03/30/2012 1120   K 3.5 05/09/2015 1202   K 3.9 03/30/2012 1120   CL 106 05/09/2015 1202   CL 106 03/30/2012 1120   CO2 22 05/09/2015 1202   CO2 28 03/30/2012 1120   GLUCOSE 104* 05/09/2015 1202   GLUCOSE 119* 03/30/2012 1120   BUN 13 05/09/2015 1202   BUN 16 03/30/2012 1120   CREATININE 0.99 05/09/2015 1202   CREATININE 1.13 03/30/2012 1120   CALCIUM 9.1 05/09/2015 1202    CALCIUM 9.4 03/30/2012 1120   PROT 7.5 08/21/2015 0907   PROT 7.2 03/30/2012 1120   ALBUMIN 4.1 08/21/2015 0907   ALBUMIN 3.9 03/30/2012 1120   AST 28 08/21/2015 0907   AST 28 03/30/2012 1120   ALT 26 08/21/2015 0907   ALT 47 03/30/2012 1120   ALKPHOS 73 08/21/2015 0907   ALKPHOS 105 03/30/2012 1120   BILITOT 0.6 08/21/2015 0907   BILITOT 0.4 03/30/2012 1120   GFRNONAA >60 05/09/2015 1202   GFRNONAA 55* 03/30/2012 1120   GFRAA >60 05/09/2015 1202   GFRAA >60 03/30/2012 1120    No results found for: SPEP, UPEP  Lab Results  Component Value Date   WBC 6.7 08/21/2015   NEUTROABS 3.9 08/21/2015   HGB 12.9 08/21/2015   HCT 38.0 08/21/2015   MCV 90.4 08/21/2015   PLT 169 08/21/2015      Chemistry      Component Value Date/Time   NA 140 05/09/2015 1202   NA 142 03/30/2012 1120   K 3.5 05/09/2015 1202   K 3.9 03/30/2012 1120   CL 106 05/09/2015 1202   CL 106 03/30/2012 1120   CO2 22 05/09/2015 1202   CO2 28 03/30/2012 1120   BUN 13 05/09/2015 1202   BUN 16 03/30/2012 1120   CREATININE 0.99 05/09/2015 1202   CREATININE 1.13 03/30/2012 1120      Component Value Date/Time   CALCIUM 9.1 05/09/2015 1202   CALCIUM 9.4 03/30/2012 1120   ALKPHOS 73 08/21/2015 0907   ALKPHOS 105 03/30/2012 1120   AST 28 08/21/2015 0907   AST 28 03/30/2012 1120   ALT 26 08/21/2015 0907   ALT 47 03/30/2012 1120   BILITOT 0.6 08/21/2015 0907   BILITOT 0.4 03/30/2012 1120        ASSESSMENT & PLAN:   Chronic refractory ITP status post multiple lines of therapy; steroid responsive but intolerant. Patient on Promacta since October 2016. Patient is currently on 50 mg every other day; platelet count today is 169. Given the good response to Promacta- I would continue current dose. Patient tolerating it well.  # Recommend checking CBC/CMP every 3 months follow-up with me in 6 months. Patient will call us regarding referral for Promacta when needed.  # She was given a copy of her labs.  #  15 minutes face-to-face with the patient discussing the above plan of care; more than 50% of time spent on prognosis/ natural history; counseling and coordination.   Orders Placed This Encounter  Procedures  . CBC with Differential    Standing Status: Future     Number of Occurrences:      Standing Expiration Date: 08/20/2016  . Comprehensive metabolic panel    Standing Status: Future     Number of Occurrences:      Standing Expiration Date: 08/20/2016  Order Specific Question:  Has the patient fasted?    Answer:  No  . CBC with Differential    Standing Status: Future     Number of Occurrences:      Standing Expiration Date: 08/20/2016  . Comprehensive metabolic panel    Standing Status: Future     Number of Occurrences:      Standing Expiration Date: 08/20/2016    Order Specific Question:  Has the patient fasted?    Answer:  No        Earna Coder, MD 08/21/2015 9:43 AM

## 2015-08-22 ENCOUNTER — Other Ambulatory Visit: Payer: 59

## 2015-08-22 ENCOUNTER — Ambulatory Visit: Payer: 59 | Admitting: Internal Medicine

## 2015-09-03 ENCOUNTER — Ambulatory Visit
Admission: EM | Admit: 2015-09-03 | Discharge: 2015-09-03 | Disposition: A | Discharge status: Home / Self Care | Payer: 59 | Attending: Family Medicine | Admitting: Family Medicine

## 2015-09-03 DIAGNOSIS — J40 Bronchitis, not specified as acute or chronic: Secondary | ICD-10-CM | POA: Diagnosis not present

## 2015-09-03 DIAGNOSIS — J0100 Acute maxillary sinusitis, unspecified: Secondary | ICD-10-CM

## 2015-09-03 MED ORDER — BENZONATATE 100 MG PO CAPS: 100.0000 mg | ORAL_CAPSULE | Freq: Three times a day (TID) | ORAL | Status: DC | PRN

## 2015-09-03 MED ORDER — AZITHROMYCIN 250 MG PO TABS: ORAL_TABLET | ORAL | Status: DC

## 2015-09-03 MED ORDER — ALBUTEROL SULFATE HFA 108 (90 BASE) MCG/ACT IN AERS: 2.0000 | INHALATION_SPRAY | RESPIRATORY_TRACT | Status: DC | PRN

## 2015-09-03 MED ORDER — GUAIFENESIN-CODEINE 100-10 MG/5ML PO SOLN: 10.0000 mL | Freq: Every evening | ORAL | Status: DC | PRN

## 2015-09-03 NOTE — ED Provider Notes (Signed)
[facility]  ____________________________________________  Time seen: Approximately 10:04 AM  I have reviewed the triage vital signs and the nursing notes.   HISTORY  Chief Complaint URI   HPI Victoria Morgan is a 60 y.o. femalepresents for complaints of 7-8 days of runny nose, nasal congestion, cough and sinus drainage. Patient reports that the sinus pressure that she was having has improved however the cough has continued. Patient reports that the cough is worse at night and can feel the drainage in the back of her throat. Patient states that she can often hear herself wheeze at night when she is coughing. Patient states that she does not wheeze during the day. Patient states that the cough is occasionally productive with greenish sputum as well as greenish mucus and blowing her nose. Denies fevers. Reports has continued to eat and drink well.  Denies chest pain or shortness of breath. Denies weakness, fevers, dizziness, abdominal pain, fall, dysuria, calf pain or increased lower extremity swelling. Patient reports that she does have chronic lower extremity swelling that has been unchanged. Reports multiple coworkers sick with similar.  PCP: Dr. Audelia Acton  Patient postmenopausal.   Past Medical History  Diagnosis Date  . Asthma   . Blood dyscrasia     ITP   dx in 1990  . History of arthritis     in hips; now resolved after hip surgery  . ITP (idiopathic thrombocytopenic purpura) 05/01/2015    Patient Active Problem List   Diagnosis Date Noted  . ITP (idiopathic thrombocytopenic purpura) 05/01/2015  . 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 H/O splenectomy 12/07/2014  . Osteoarthritis of left hip 05/15/2014  . Accumulation of fluid in tissues 03/09/2014  . Chronic LBP 03/09/2014  . Dermatitis, eczematoid 03/09/2014  . Hypercholesterolemia without hypertriglyceridemia 03/09/2014  . AS (sickle cell trait) (HCC) 03/09/2014  . Osteoarthritis of right hip 11/28/2013  .  Acute blood loss anemia 11/28/2013  . Thrombocytopenia (HCC) 11/28/2013    Past Surgical History  Procedure Laterality Date  . Spleenectomy  1991    x 2  . Tubal ligation    . Hand surgery      right hand  . 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 Total hip arthroplasty Left 05/15/2014    Procedure: TOTAL HIP ARTHROPLASTY ANTERIOR APPROACH;  Surgeon: Harvie Junior, MD;  Location: [facility] OR;  Service: Orthopedics;  Laterality: Left;    Current Outpatient Rx  Name  Route  Sig  Dispense  Refill  . calcium carbonate (OS-CAL - DOSED IN MG OF ELEMENTAL CALCIUM) 1250 (500 Ca) MG tablet   Oral   Take by mouth.         . Calcium Carbonate-Vitamin D (CALCIUM + D PO)   Oral   Take 1 tablet by mouth daily.         . Cetirizine HCl 10 MG CAPS   Oral   Take 1 capsule by mouth as needed. Seasonal allergies         . cycloSPORINE (RESTASIS) 0.05 % ophthalmic emulsion      1 drop 2 (two) times daily.         Marland Kitchen eltrombopag (PROMACTA) 50 MG tablet   Oral   Take 1 tablet (50 mg total) by mouth daily. Take on an empty stomach 1 hour  before a meal or 2 hours after         . furosemide (LASIX) 40 MG tablet   Oral   Take 40 mg by mouth daily.          Marland Kitchen GARCINIA CAMBOGIA-CHROMIUM PO   Oral   Take 1 tablet by mouth daily.         . Melatonin 10 MG CAPS   Oral   Take 10 mg by mouth at bedtime as needed.          . Multiple Vitamin (MULTIVITAMIN WITH MINERALS) TABS tablet   Oral   Take 1 tablet by mouth daily.         . Multiple Vitamins-Minerals (OCUVITE EYE + MULTI PO)   Oral   Take 1 tablet by mouth once.         Marland Kitchen acetaminophen (TYLENOL ARTHRITIS PAIN) 650 MG CR tablet   Oral   Take 650 mg by mouth 2 (two) times daily as needed for pain.         . Naphazoline HCl  (CLEAR EYES OP)   Both Eyes   Place 1 drop into both eyes daily.         . NON FORMULARY   Mouth/Throat   Use as directed 1 scoop in the mouth or throat daily. IT WORKS! (The Greens) pt mixes in water or fruit juice           Allergies Nsaids; Aspirin; and Citrus  Family History  Problem Relation Age of Onset  . Breast cancer Paternal Aunt 80    x 2  . Prostate cancer Paternal Uncle 9  . Bone cancer Paternal Uncle 60  . Brain cancer Paternal Uncle 20  . Lung cancer Paternal Uncle 68  . Lung disease Mother     Social History Social History  Substance Use Topics  . Smoking status: Never Smoker   . Smokeless tobacco: Never Used  . Alcohol Use: 0.0 oz/week    0 Standard drinks or equivalent per week     Comment: occasionally    Review of Systems Constitutional: No fever/chills Eyes: No visual changes. ENT: positive runny nose, nasal congestion, cough, intermittent sore throat.  Cardiovascular: Denies chest pain. Respiratory: Denies shortness of breath. Gastrointestinal: No abdominal pain.  No nausea, no vomiting.  No diarrhea.  No constipation. Genitourinary: Negative for dysuria. Musculoskeletal: Negative for back pain. Skin: Negative for rash. Neurological: Negative for headaches, focal weakness or numbness.  10-point ROS otherwise negative.  ____________________________________________   PHYSICAL EXAM:  VITAL SIGNS: ED Triage Vitals  Enc Vitals Group     BP 09/03/15 0943 130/76 mmHg     Pulse Rate 09/03/15 0943 83     Resp 09/03/15 0943 18     Temp 09/03/15 0943 96.5 F (35.8 C)     Temp Source 09/03/15 0943 Tympanic     SpO2 09/03/15 0943 99 %     Weight 09/03/15 0943 243 lb (110.224 kg)     Height 09/03/15 0943  (1.651 m)     Head Cir --      Peak Flow --      Pain Score --      Pain Loc --      Pain Edu? --      Excl. in GC? --     Constitutional: Alert and oriented. Well appearing and in no acute distress. Eyes: Conjunctivae are  normal. PERRL. EOMI. Head: Atraumatic. Sinuses nontender. No swelling. No erythema.  Ears: no erythema, normal TMs bilaterally.   Nose: nasal congestion with bilateral nasal turbinate erythema and edema.   Mouth/Throat: Mucous membranes are moist.  Oropharynx non-erythematous. No tonsillar swelling or exudate.  Neck: No stridor.  No cervical spine tenderness to palpation. Hematological/Lymphatic/Immunilogical: No cervical lymphadenopathy. Cardiovascular: Normal rate, regular rhythm. Grossly normal heart sounds.  Good peripheral circulation. Respiratory: Normal respiratory effort.  No retractions. Very mild scattered rhonchi. No wheezes or rales. Good air movement. Dry intermittent cough in room. No wheezes noted with cough. Gastrointestinal: Soft and nontender. Obese abdomen. Normal Bowel sounds. No CVA tenderness. Musculoskeletal: No lower or upper extremity tenderness.  Bilateral pedal pulses equal and easily palpated. Mild bilateral lower ankle nonpitting edema, per patient chronic and unchanged. No calf tenderness bilaterally. No lower extremity tenderness to palpation. No cervical, thoracic or lumbar tenderness to palpation.  Neurologic:  Normal speech and language. No gross focal neurologic deficits are appreciated. No gait instability. Skin:  Skin is warm, dry and intact. No rash noted. Psychiatric: Mood and affect are normal. Speech and behavior are normal. ____________________________________________   LABS (all labs ordered are listed, but only abnormal results are displayed)  Labs Reviewed - No data to display   INITIAL IMPRESSION / ASSESSMENT AND PLAN / ED COURSE  Pertinent labs & imaging results that were available during my care of the patient were reviewed by me and considered in my medical decision making (see chart for details).  Very well-appearing patient. No acute distress. Presents for complaints of 7-8 days of runny nose, nasal congestion, sinus drainage and cough.  Patient reports occasional wheezing with cough. Denies wheezing in absence of cough. Denies chest pain or shortness of breath. Patient with mild scattered rhonchi, good air movement, no focal areas of consolidation auscultated. Discussed evaluating by chest x-ray. Patient states that she does not want a chest x-ray at this time. Counseled regarding if symptoms do not improve with treatment or patient develops fevers then to have prompt  reevaluation including likely chest x-ray.  Suspect improving sinusitis with bronchitis. Will treat with azithromycin, when necessary Tessalon Perles during the day and when necessary  guaifenesin with codeine as needed at bedtime,as well as pro-air inhaler as needed. Encouraged rest, fluids and PCP follow-up.   Discussed follow up with Primary care physician this week. Discussed follow up and return parameters including no resolution or any worsening concerns. Patient verbalized understanding and agreed to plan.   ____________________________________________   FINAL CLINICAL IMPRESSION(S) / ED DIAGNOSES  Final diagnoses:  Bronchitis  Acute maxillary sinusitis, recurrence not specified    Note: This dictation was prepared with Dragon dictation along with smaller phrase technology. Any transcriptional errors that result from this process are unintentional.    Renford Dills, NP 09/03/15 1031

## 2015-09-03 NOTE — Discharge Instructions (Signed)
Take medication as prescribed. Rest. Drink plenty of fluids.   Follow up with your primary care physician this week. Return to Urgent care or proceed to ER for chest pain, shortness of breath, fevers, weakness, no improvement or resolution, new or worsening concerns.    Sinusitis, Adult Sinusitis is redness, soreness, and puffiness (inflammation) of the air pockets in the bones of your face (sinuses). The redness, soreness, and puffiness can cause air and mucus to get trapped in your sinuses. This can allow germs to grow and cause an infection.  HOME CARE  1. Drink enough fluids to keep your pee (urine) clear or pale yellow. 2. Use a humidifier in your home. 3. Run a hot shower to create steam in the bathroom. Sit in the bathroom with the door closed. Breathe in the steam 3-4 times a day. 4. Put a warm, moist washcloth on your face 3-4 times a day, or as told by your doctor. 5. Use salt water sprays (saline sprays) to wet the thick fluid in your nose. This can help the sinuses drain. 6. Only take medicine as told by your doctor. GET HELP RIGHT AWAY IF:   Your pain gets worse.  You have very bad headaches.  You are sick to your stomach (nauseous).  You throw up (vomit).  You are very sleepy (drowsy) all the time.  Your face is puffy (swollen).  Your vision changes.  You have a stiff neck.  You have trouble breathing. MAKE SURE YOU:   Understand these instructions.  Will watch your condition.  Will get help right away if you are not doing well or get worse.   This information is not intended to replace advice given to you by your health care provider. Make sure you discuss any questions you have with your health care provider.   Document Released: 01/14/2008 Document Revised: 08/18/2014 Document Reviewed: 03/02/2012 Elsevier Interactive Patient Education 2016 [employer].  How to Use an Inhaler Using your inhaler correctly is very important. Good technique will make  sure that the medicine reaches your lungs.  HOW TO USE AN INHALER: 7. Take the cap off the inhaler. 8. If this is the first time using your inhaler, you need to prime it. Shake the inhaler for 5 seconds. Release four puffs into the air, away from your face. Ask your doctor for help if you have questions. 9. Shake the inhaler for 5 seconds. 10. Turn the inhaler so the bottle is above the mouthpiece. 11. Put your pointer finger on top of the bottle. Your thumb holds the bottom of the inhaler. 12. Open your mouth. 13. Either hold the inhaler away from your mouth (the width of 2 fingers) or place your lips tightly around the mouthpiece. Ask your doctor which way to use your inhaler. 14. Breathe out as much air as possible. 15. Breathe in and push down on the bottle 1 time to release the medicine. You will feel the medicine go in your mouth and throat. 16. Continue to take a deep breath in very slowly. Try to fill your lungs. 17. After you have breathed in completely, hold your breath for 10 seconds. This will help the medicine to settle in your lungs. If you cannot hold your breath for 10 seconds, hold it for as long as you can before you breathe out. 18. Breathe out slowly, through pursed lips. Whistling is an example of pursed lips. 19. If your doctor has told you to take more than 1 puff, wait  at least 15-30 seconds between puffs. This will help you get the best results from your medicine. Do not use the inhaler more than your doctor tells you to. 20. Put the cap back on the inhaler. 21. Follow the directions from your doctor or from the inhaler package about cleaning the inhaler. If you use more than one inhaler, ask your doctor which inhalers to use and what order to use them in. Ask your doctor to help you figure out when you will need to refill your inhaler.  If you use a steroid inhaler, always rinse your mouth with water after your last puff, gargle and spit out the water. Do not swallow the  water. GET HELP IF:  The inhaler medicine only partially helps to stop wheezing or shortness of breath.  You are having trouble using your inhaler.  You have some increase in thick spit (phlegm). GET HELP RIGHT AWAY IF:  The inhaler medicine does not help your wheezing or shortness of breath or you have tightness in your chest.  You have dizziness, headaches, or fast heart rate.  You have chills, fever, or night sweats.  You have a large increase of thick spit, or your thick spit is bloody. MAKE SURE YOU:   Understand these instructions.  Will watch your condition.  Will get help right away if you are not doing well or get worse.   This information is not intended to replace advice given to you by your health care provider. Make sure you discuss any questions you have with your health care provider.   Document Released: 05/06/2008 Document Revised: 05/18/2013 Document Reviewed: 02/24/2013 Elsevier Interactive Patient Education [employer]

## 2015-09-03 NOTE — ED Notes (Signed)
Started 1 week ago with cough. Several days ago began to wheeze. + Productive cough

## 2015-11-20 ENCOUNTER — Telehealth: Payer: Self-pay | Admitting: *Deleted

## 2015-11-20 ENCOUNTER — Inpatient Hospital Stay: Payer: 59 | Attending: Internal Medicine

## 2015-11-20 DIAGNOSIS — D693 Immune thrombocytopenic purpura: Secondary | ICD-10-CM | POA: Diagnosis present

## 2015-11-20 DIAGNOSIS — Z79899 Other long term (current) drug therapy: Secondary | ICD-10-CM | POA: Diagnosis not present

## 2015-11-20 LAB — COMPREHENSIVE METABOLIC PANEL
ALK PHOS: 66 U/L (ref 38–126)
ALT: 26 U/L (ref 14–54)
ANION GAP: 6 (ref 5–15)
AST: 32 U/L (ref 15–41)
Albumin: 4 g/dL (ref 3.5–5.0)
BILIRUBIN TOTAL: 0.7 mg/dL (ref 0.3–1.2)
BUN: 12 mg/dL (ref 6–20)
CALCIUM: 9.3 mg/dL (ref 8.9–10.3)
CO2: 24 mmol/L (ref 22–32)
Chloride: 107 mmol/L (ref 101–111)
Creatinine, Ser: 1.06 mg/dL — ABNORMAL HIGH (ref 0.44–1.00)
GFR calc non Af Amer: 57 mL/min — ABNORMAL LOW (ref >60)
Glucose, Bld: 135 mg/dL — ABNORMAL HIGH (ref 65–99)
Potassium: 3.6 mmol/L (ref 3.5–5.1)
Sodium: 137 mmol/L (ref 135–145)
TOTAL PROTEIN: 7.1 g/dL (ref 6.5–8.1)

## 2015-11-20 LAB — CBC WITH DIFFERENTIAL/PLATELET
Basophils Absolute: 0.1 10*3/uL (ref 0–0.1)
Basophils Relative: 1 %
EOS ABS: 0.3 10*3/uL (ref 0–0.7)
Eosinophils Relative: 4 %
HEMATOCRIT: 37.7 % (ref 35.0–47.0)
HEMOGLOBIN: 12.7 g/dL (ref 12.0–16.0)
LYMPHS ABS: 1.4 10*3/uL (ref 1.0–3.6)
Lymphocytes Relative: 23 %
MCH: 30.4 pg (ref 26.0–34.0)
MCHC: 33.6 g/dL (ref 32.0–36.0)
MCV: 90.6 fL (ref 80.0–100.0)
MONO ABS: 0.6 10*3/uL (ref 0.2–0.9)
MONOS PCT: 10 %
NEUTROS ABS: 3.8 10*3/uL (ref 1.4–6.5)
Neutrophils Relative %: 62 %
Platelets: 185 10*3/uL (ref 150–440)
RBC: 4.16 MIL/uL (ref 3.80–5.20)
RDW: 14 % (ref 11.5–14.5)
WBC: 6.3 10*3/uL (ref 3.6–11.0)

## 2015-11-20 NOTE — Telephone Encounter (Signed)
Attempted twice to call patient at contact number provided to give her lab results.  Phone is answered and I can hear people talking in the background, however, no one speaks and does not answer when I speak.  Tried home number also, however, no answering machine picks up.

## 2015-11-20 NOTE — Telephone Encounter (Signed)
-----   Message from Lane HackerLisa R Torain sent at 11/20/2015 10:51 AM EDT ----- Contact: [phone] Per pt would like lab results. Please leave a message pt cant answer phone at work.

## 2015-11-20 NOTE — Telephone Encounter (Signed)
Called patient and left message that her labs are all normal except for creatinine which is slightly elevated.  Encouraged her to increase her water intake.

## 2015-12-24 ENCOUNTER — Other Ambulatory Visit: Payer: Self-pay | Admitting: *Deleted

## 2015-12-24 MED ORDER — ELTROMBOPAG OLAMINE 50 MG PO TABS: 50.0000 mg | ORAL_TABLET | ORAL | Status: DC

## 2016-02-15 DIAGNOSIS — Z6841 Body Mass Index (BMI) 40.0 and over, adult: Secondary | ICD-10-CM | POA: Insufficient documentation

## 2016-02-19 ENCOUNTER — Inpatient Hospital Stay (HOSPITAL_BASED_OUTPATIENT_CLINIC_OR_DEPARTMENT_OTHER): Payer: 59 | Admitting: Internal Medicine

## 2016-02-19 ENCOUNTER — Inpatient Hospital Stay: Payer: 59 | Attending: Internal Medicine

## 2016-02-19 VITALS — BP 128/78 | HR 72 | Temp 98.2°F | Resp 18 | Wt 242.7 lb

## 2016-02-19 DIAGNOSIS — Z79899 Other long term (current) drug therapy: Secondary | ICD-10-CM

## 2016-02-19 DIAGNOSIS — D693 Immune thrombocytopenic purpura: Secondary | ICD-10-CM | POA: Diagnosis not present

## 2016-02-19 LAB — COMPREHENSIVE METABOLIC PANEL
ALBUMIN: 4.1 g/dL (ref 3.5–5.0)
ALT: 24 U/L (ref 14–54)
ANION GAP: 7 (ref 5–15)
AST: 26 U/L (ref 15–41)
Alkaline Phosphatase: 70 U/L (ref 38–126)
BILIRUBIN TOTAL: 0.6 mg/dL (ref 0.3–1.2)
BUN: 13 mg/dL (ref 6–20)
CO2: 25 mmol/L (ref 22–32)
Calcium: 9.2 mg/dL (ref 8.9–10.3)
Chloride: 107 mmol/L (ref 101–111)
Creatinine, Ser: 0.97 mg/dL (ref 0.44–1.00)
GFR calc Af Amer: >60 mL/min (ref >60)
GLUCOSE: 105 mg/dL — AB (ref 65–99)
POTASSIUM: 3.8 mmol/L (ref 3.5–5.1)
Sodium: 139 mmol/L (ref 135–145)
TOTAL PROTEIN: 7.3 g/dL (ref 6.5–8.1)

## 2016-02-19 LAB — CBC WITH DIFFERENTIAL/PLATELET
BASOS PCT: 1 %
Basophils Absolute: 0.1 10*3/uL (ref 0–0.1)
Eosinophils Absolute: 0.3 10*3/uL (ref 0–0.7)
Eosinophils Relative: 4 %
HEMATOCRIT: 38.6 % (ref 35.0–47.0)
Hemoglobin: 12.9 g/dL (ref 12.0–16.0)
Lymphocytes Relative: 28 %
Lymphs Abs: 1.8 10*3/uL (ref 1.0–3.6)
MCH: 30.3 pg (ref 26.0–34.0)
MCHC: 33.3 g/dL (ref 32.0–36.0)
MCV: 91 fL (ref 80.0–100.0)
MONO ABS: 0.8 10*3/uL (ref 0.2–0.9)
MONOS PCT: 12 %
NEUTROS ABS: 3.6 10*3/uL (ref 1.4–6.5)
Neutrophils Relative %: 55 %
Platelets: 190 10*3/uL (ref 150–440)
RBC: 4.25 MIL/uL (ref 3.80–5.20)
RDW: 13.9 % (ref 11.5–14.5)
WBC: 6.6 10*3/uL (ref 3.6–11.0)

## 2016-02-19 NOTE — Assessment & Plan Note (Addendum)
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 Patient on Promacta since October 2016. Patient is currently on 50 mg every other day; platelet count today is 190.  Given the good response to Promacta- I would continue current dose. Patient tolerating it well.  # follow up in 6 months-labs/ She was given a copy of her labs.  # 15 minutes face-to-face with the patient discussing the above plan of care; more than 50% of time spent on prognosis/ natural history; counseling and coordination.

## 2016-02-19 NOTE — Progress Notes (Signed)
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   She continues to deny any bloody nose or bloody gums or petechial rash. Denies any other mucosal bleeding. Continues to complain of weight gain; she has been started on an appetite suppressant recently. Not on any steroids recently.    REVIEW OF SYSTEMS:  A complete 10 point review of system is done with his negative except mentioned above in history of present illness.   I have reviewed the past medical history, past surgical history, social history and family history with the patient and they are unchanged from previous note unless stated above.  ALLERGIES:  is allergic to nsaids; aspirin; and citrus.  MEDICATIONS:  Current Outpatient Prescriptions  Medication Sig Dispense Refill  . acetaminophen (TYLENOL ARTHRITIS PAIN) 650 MG CR tablet Take 650 mg by mouth 2 (two) times daily as needed for pain.    Marland Kitchen buPROPion (WELLBUTRIN SR) 150 MG 12 hr tablet     . calcium carbonate (OS-CAL - DOSED IN MG OF ELEMENTAL CALCIUM) 1250 (500 Ca) MG tablet Take by mouth.    . Calcium Carbonate-Vitamin D (CALCIUM + D PO) Take 1 tablet by mouth daily.    . Cetirizine HCl 10 MG CAPS Take 1 capsule by mouth as needed. Seasonal allergies    . cycloSPORINE (RESTASIS) 0.05 % ophthalmic emulsion 1 drop 2 (two) times daily.    Marland Kitchen eltrombopag (PROMACTA) 50 MG tablet Take 1 tablet (50 mg total) by mouth every other day. Take on an empty stomach 1 hour before a meal  or 2 hours after 45 tablet 0  . GARCINIA CAMBOGIA-CHROMIUM PO Take 1 tablet by mouth daily.    Marland Kitchen guaiFENesin-codeine 100-10 MG/5ML syrup Take 10 mLs by mouth at bedtime as needed for cough. 75 mL 0  . Melatonin 10 MG CAPS Take 10 mg by mouth at bedtime as needed.     . Multiple Vitamin (MULTIVITAMIN WITH MINERALS) TABS tablet Take 1 tablet by mouth daily.    . Multiple Vitamins-Minerals (OCUVITE EYE + MULTI PO) Take 1 tablet by mouth once.    . Naphazoline HCl (CLEAR EYES OP) Place 1 drop into both eyes daily.    . NON FORMULARY Use as directed 1 scoop in the mouth or throat daily. IT WORKS! (The Greens) pt mixes in water or fruit juice    . furosemide (LASIX) 40 MG tablet Take 40 mg by mouth daily.      No current facility-administered medications for this visit.    PHYSICAL EXAMINATION:   BP 128/78 mmHg  Pulse 72  Temp(Src) 98.2 F (36.8 C) (Tympanic)  Resp 18  Wt 242 lb 11.6 oz (110.1 kg)  Filed Weights   02/19/16 0942  Weight: 242 lb 11.6 oz (110.1 kg)    GENERAL:alert, no distress and comfortable. Patient alone. She is obese. EYES: normal, Conjunctiva are pink and non-injected, sclera clear OROPHARYNX:no exudate, no erythema and lips, buccal mucosa, and tongue normal  NECK: No thyromegaly LYMPH:  no palpable lymphadenopathy in the cervical,  axillary or inguinal LUNGS: clear to auscultation with normal breathing effort; No Wheeze or crackles  Cardio-vascular- Regular Rate & Rythm and no murmurs and no lower extremity edema ABDOMEN:abdomen soft, non-tender and normal bowel sounds; No hepato-splenomegaly.  Musculoskeletal:no cyanosis of digits and no clubbing  NEURO: alert & oriented x 3 with fluent speech, no focal motor/sensory deficits. SKIN: no skin rash   LABORATORY DATA:  I have reviewed the data as listed    Component Value Date/Time   NA 139 02/19/2016 0926   NA 142 03/30/2012 1120   K 3.8 02/19/2016 0926   K 3.9 03/30/2012 1120   CL 107 02/19/2016 0926   CL  106 03/30/2012 1120   CO2 25 02/19/2016 0926   CO2 28 03/30/2012 1120   GLUCOSE 105* 02/19/2016 0926   GLUCOSE 119* 03/30/2012 1120   BUN 13 02/19/2016 0926   BUN 16 03/30/2012 1120   CREATININE 0.97 02/19/2016 0926   CREATININE 1.13 03/30/2012 1120   CALCIUM 9.2 02/19/2016 0926   CALCIUM 9.4 03/30/2012 1120   PROT 7.3 02/19/2016 0926   PROT 7.2 03/30/2012 1120   ALBUMIN 4.1 02/19/2016 0926   ALBUMIN 3.9 03/30/2012 1120   AST 26 02/19/2016 0926   AST 28 03/30/2012 1120   ALT 24 02/19/2016 0926   ALT 47 03/30/2012 1120   ALKPHOS 70 02/19/2016 0926   ALKPHOS 105 03/30/2012 1120   BILITOT 0.6 02/19/2016 0926   BILITOT 0.4 03/30/2012 1120   GFRNONAA >60 02/19/2016 0926   GFRNONAA 55* 03/30/2012 1120   GFRAA >60 02/19/2016 0926   GFRAA >60 03/30/2012 1120    No results found for: SPEP, UPEP  Lab Results  Component Value Date   WBC 6.6 02/19/2016   NEUTROABS 3.6 02/19/2016   HGB 12.9 02/19/2016   HCT 38.6 02/19/2016   MCV 91.0 02/19/2016   PLT 190 02/19/2016      Chemistry      Component Value Date/Time   NA 139 02/19/2016 0926   NA 142 03/30/2012 1120   K 3.8 02/19/2016 0926   K 3.9 03/30/2012 1120   CL 107 02/19/2016 0926   CL 106 03/30/2012 1120   CO2 25 02/19/2016 0926   CO2 28 03/30/2012 1120   BUN 13 02/19/2016 0926   BUN 16 03/30/2012 1120   CREATININE 0.97 02/19/2016 0926   CREATININE 1.13 03/30/2012 1120      Component Value Date/Time   CALCIUM 9.2 02/19/2016 0926   CALCIUM 9.4 03/30/2012 1120   ALKPHOS 70 02/19/2016 0926   ALKPHOS 105 03/30/2012 1120   AST 26 02/19/2016 0926   AST 28 03/30/2012 1120   ALT 24 02/19/2016 0926   ALT 47 03/30/2012 1120   BILITOT 0.6 02/19/2016 0926   BILITOT 0.4 03/30/2012 1120        ASSESSMENT & PLAN:   ITP (idiopathic thrombocytopenic purpura) Chronic refractory ITP status post multiple lines of therapy; steroid responsive but intolerant. Patient on Promacta since October 2016. Patient is currently on 50  mg every other day; platelet count today is 190.  Given the good response to Promacta- I would continue current dose. Patient tolerating it well.  # follow up in 6 months-labs/ She was given a copy of her labs.  # 15 minutes face-to-face with the patient discussing the above plan of care; more than 50% of time spent on prognosis/ natural history; counseling and coordination.      Orders Placed This Encounter  Procedures  . CBC with Differential  Standing Status: Future     Number of Occurrences:      Standing Expiration Date: 02/18/2017  . Comprehensive metabolic panel    Standing Status: Future     Number of Occurrences:      Standing Expiration Date: 02/18/2017    Order Specific Question:  Has the patient fasted?    Answer:  No        Earna CoderGovinda R Brahmanday, MD 02/19/2016 9:52 AM

## 2016-05-27 DIAGNOSIS — Z79899 Other long term (current) drug therapy: Secondary | ICD-10-CM | POA: Insufficient documentation

## 2016-07-01 ENCOUNTER — Other Ambulatory Visit: Payer: Self-pay | Admitting: Obstetrics and Gynecology

## 2016-07-01 DIAGNOSIS — Z1231 Encounter for screening mammogram for malignant neoplasm of breast: Secondary | ICD-10-CM

## 2016-07-15 ENCOUNTER — Ambulatory Visit
Admission: RE | Admit: 2016-07-15 | Discharge: 2016-07-15 | Disposition: A | Discharge status: Home / Self Care | Payer: 59 | Source: Ambulatory Visit | Attending: Obstetrics and Gynecology | Admitting: Obstetrics and Gynecology

## 2016-07-15 DIAGNOSIS — Z1231 Encounter for screening mammogram for malignant neoplasm of breast: Secondary | ICD-10-CM | POA: Insufficient documentation

## 2016-08-19 ENCOUNTER — Inpatient Hospital Stay (HOSPITAL_BASED_OUTPATIENT_CLINIC_OR_DEPARTMENT_OTHER): Payer: 59 | Admitting: Internal Medicine

## 2016-08-19 ENCOUNTER — Inpatient Hospital Stay: Payer: 59 | Attending: Internal Medicine

## 2016-08-19 DIAGNOSIS — R600 Localized edema: Secondary | ICD-10-CM | POA: Diagnosis not present

## 2016-08-19 DIAGNOSIS — D693 Immune thrombocytopenic purpura: Secondary | ICD-10-CM | POA: Diagnosis present

## 2016-08-19 DIAGNOSIS — I10 Essential (primary) hypertension: Secondary | ICD-10-CM | POA: Diagnosis not present

## 2016-08-19 DIAGNOSIS — R944 Abnormal results of kidney function studies: Secondary | ICD-10-CM

## 2016-08-19 DIAGNOSIS — Z79899 Other long term (current) drug therapy: Secondary | ICD-10-CM

## 2016-08-19 DIAGNOSIS — Z9081 Acquired absence of spleen: Secondary | ICD-10-CM | POA: Diagnosis not present

## 2016-08-19 LAB — CBC WITH DIFFERENTIAL/PLATELET
Basophils Absolute: 0.1 10*3/uL (ref 0–0.1)
Basophils Relative: 1 %
EOS PCT: 4 %
Eosinophils Absolute: 0.2 10*3/uL (ref 0–0.7)
HEMATOCRIT: 38.6 % (ref 35.0–47.0)
Hemoglobin: 13.1 g/dL (ref 12.0–16.0)
LYMPHS PCT: 19 %
Lymphs Abs: 1.2 10*3/uL (ref 1.0–3.6)
MCH: 30.9 pg (ref 26.0–34.0)
MCHC: 34 g/dL (ref 32.0–36.0)
MCV: 90.7 fL (ref 80.0–100.0)
MONOS PCT: 13 %
Monocytes Absolute: 0.8 10*3/uL (ref 0.2–0.9)
NEUTROS ABS: 3.9 10*3/uL (ref 1.4–6.5)
Neutrophils Relative %: 63 %
PLATELETS: 267 10*3/uL (ref 150–440)
RBC: 4.26 MIL/uL (ref 3.80–5.20)
RDW: 13.6 % (ref 11.5–14.5)
WBC: 6.2 10*3/uL (ref 3.6–11.0)

## 2016-08-19 LAB — COMPREHENSIVE METABOLIC PANEL
ALT: 36 U/L (ref 14–54)
ANION GAP: 7 (ref 5–15)
AST: 34 U/L (ref 15–41)
Albumin: 4 g/dL (ref 3.5–5.0)
Alkaline Phosphatase: 66 U/L (ref 38–126)
BILIRUBIN TOTAL: 0.6 mg/dL (ref 0.3–1.2)
BUN: 11 mg/dL (ref 6–20)
CO2: 26 mmol/L (ref 22–32)
Calcium: 9.3 mg/dL (ref 8.9–10.3)
Chloride: 105 mmol/L (ref 101–111)
Creatinine, Ser: 1.15 mg/dL — ABNORMAL HIGH (ref 0.44–1.00)
GFR, EST AFRICAN AMERICAN: 60 mL/min — AB (ref >60)
GFR, EST NON AFRICAN AMERICAN: 51 mL/min — AB (ref >60)
Glucose, Bld: 99 mg/dL (ref 65–99)
Potassium: 3.4 mmol/L — ABNORMAL LOW (ref 3.5–5.1)
Sodium: 138 mmol/L (ref 135–145)
TOTAL PROTEIN: 7.3 g/dL (ref 6.5–8.1)

## 2016-08-19 NOTE — Assessment & Plan Note (Addendum)
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 Patient on Promacta since October 2016. Patient is currently on 50 mg every other day; platelet count today is 267.  Given the good response to Promacta- I would continue current dose. Patient tolerating it well.  #Bilateral lower extremity edema- +1 pitting edema. Encouraged elevation. F/U with PCP.   # Hypertension- F/U with PCP.   # Elevated creatinine- Encouraged her to drink plenty of fluids. F/U with PCP.    # follow up in 6 months-labs/ She was given a copy of her labs.

## 2016-08-19 NOTE — Progress Notes (Signed)
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   She continues to deny any bloody nose or bloody gums or petechial rash. Denies any other mucosal bleeding. She complains of fatigue but attributes it to working to much and staying up too late. She complains of swelling in bilateral lower extremities but states that her PCP has been adjusting her blood pressure medications to help with this.    REVIEW OF SYSTEMS:  A complete 10 point review of system is done with his negative except mentioned above in history of present illness.   I have reviewed the past medical history, past surgical history, social history and family history with the patient and they are unchanged from previous note unless stated above.  ALLERGIES:  is allergic to nsaids; aspirin; and citrus.  MEDICATIONS:  Current Outpatient Prescriptions  Medication Sig Dispense Refill  . acetaminophen (TYLENOL ARTHRITIS PAIN) 650 MG CR tablet Take 650 mg by mouth 2 (two) times daily as needed for pain.    Marland Kitchen. buPROPion (WELLBUTRIN SR) 150 MG 12 hr tablet Take 150 mg by mouth daily.     . Calcium Carbonate-Vitamin D (CALCIUM + D PO) Take 1 tablet by mouth daily.    . Cetirizine HCl 10 MG CAPS Take 1 capsule by mouth as needed. Seasonal allergies    . cycloSPORINE (RESTASIS) 0.05 % ophthalmic emulsion 1 drop 2 (two) times daily.    Marland Kitchen. eltrombopag (PROMACTA) 50 MG tablet Take 1 tablet (50 mg total) by mouth every other day. Take on an  empty stomach 1 hour before a meal or 2 hours after 45 tablet 0  . Melatonin 10 MG CAPS Take 10 mg by mouth at bedtime as needed.     . Multiple Vitamin (MULTIVITAMIN WITH MINERALS) TABS tablet Take 1 tablet by mouth daily.    . Naphazoline HCl (CLEAR EYES OP) Place 1 drop into both eyes daily.    . NON FORMULARY Use as directed 1 scoop in the mouth or throat daily. IT WORKS! (The Greens) pt mixes in water or fruit juice    . Omega-3 1000 MG CAPS Take by mouth.    . furosemide (LASIX) 40 MG tablet Take 40 mg by mouth daily.      No current facility-administered medications for this visit.     PHYSICAL EXAMINATION:   BP 132/84 (BP Location: Left Arm, Patient Position: Sitting)   Pulse 74   Temp 97.5 F (36.4 C) (Tympanic)   Wt 237 lb 1.7 oz (107.6 kg)   BMI 39.46 kg/m   Filed Weights   08/19/16 0955  Weight: 237 lb 1.7 oz (107.6 kg)    GENERAL:alert, no distress and comfortable. Patient alone. She is obese. EYES: normal, Conjunctiva are pink and non-injected, sclera clear OROPHARYNX:no exudate, no erythema and lips, buccal mucosa, and tongue normal  NECK: No thyromegaly LYMPH:  no palpable lymphadenopathy in the cervical, axillary or inguinal LUNGS: clear to auscultation with normal breathing effort; No Wheeze or crackles  Cardio-vascular- Regular Rate & Rythm and  no murmurs. +1 pitting edema in bilateral lower extremities ABDOMEN:abdomen soft, non-tender and normal bowel sounds; No hepato-splenomegaly.  Musculoskeletal:no cyanosis of digits and no clubbing  NEURO: alert & oriented x 3 with fluent speech, no focal motor/sensory deficits. SKIN: no skin rash   LABORATORY DATA:  I have reviewed the data as listed    Component Value Date/Time   NA 138 08/19/2016 0931   NA 142 03/30/2012 1120   K 3.4 (L) 08/19/2016 0931   K 3.9 03/30/2012 1120   CL 105 08/19/2016 0931   CL 106 03/30/2012 1120   CO2 26 08/19/2016 0931   CO2 28 03/30/2012 1120   GLUCOSE 99 08/19/2016 0931    GLUCOSE 119 (H) 03/30/2012 1120   BUN 11 08/19/2016 0931   BUN 16 03/30/2012 1120   CREATININE 1.15 (H) 08/19/2016 0931   CREATININE 1.13 03/30/2012 1120   CALCIUM 9.3 08/19/2016 0931   CALCIUM 9.4 03/30/2012 1120   PROT 7.3 08/19/2016 0931   PROT 7.2 03/30/2012 1120   ALBUMIN 4.0 08/19/2016 0931   ALBUMIN 3.9 03/30/2012 1120   AST 34 08/19/2016 0931   AST 28 03/30/2012 1120   ALT 36 08/19/2016 0931   ALT 47 03/30/2012 1120   ALKPHOS 66 08/19/2016 0931   ALKPHOS 105 03/30/2012 1120   BILITOT 0.6 08/19/2016 0931   BILITOT 0.4 03/30/2012 1120   GFRNONAA 51 (L) 08/19/2016 0931   GFRNONAA 55 (L) 03/30/2012 1120   GFRAA 60 (L) 08/19/2016 0931   GFRAA >60 03/30/2012 1120    No results found for: SPEP, UPEP  Lab Results  Component Value Date   WBC 6.2 08/19/2016   NEUTROABS 3.9 08/19/2016   HGB 13.1 08/19/2016   HCT 38.6 08/19/2016   MCV 90.7 08/19/2016   PLT 267 08/19/2016      Chemistry      Component Value Date/Time   NA 138 08/19/2016 0931   NA 142 03/30/2012 1120   K 3.4 (L) 08/19/2016 0931   K 3.9 03/30/2012 1120   CL 105 08/19/2016 0931   CL 106 03/30/2012 1120   CO2 26 08/19/2016 0931   CO2 28 03/30/2012 1120   BUN 11 08/19/2016 0931   BUN 16 03/30/2012 1120   CREATININE 1.15 (H) 08/19/2016 0931   CREATININE 1.13 03/30/2012 1120      Component Value Date/Time   CALCIUM 9.3 08/19/2016 0931   CALCIUM 9.4 03/30/2012 1120   ALKPHOS 66 08/19/2016 0931   ALKPHOS 105 03/30/2012 1120   AST 34 08/19/2016 0931   AST 28 03/30/2012 1120   ALT 36 08/19/2016 0931   ALT 47 03/30/2012 1120   BILITOT 0.6 08/19/2016 0931   BILITOT 0.4 03/30/2012 1120        ASSESSMENT & PLAN:   Idiopathic thrombocytopenic purpura (HCC) Chronic refractory ITP status post multiple lines of therapy; steroid responsive but intolerant. Patient on Promacta since October 2016. Patient is currently on 50 mg every other day; platelet count today is 267.  Given the good response to  Promacta- I would continue current dose. Patient tolerating it well.  #Bilateral lower extremity edema- +1 pitting edema. Encouraged elevation. F/U with PCP.   # Hypertension- F/U with PCP.   # Elevated creatinine- Encouraged her to drink plenty of fluids. F/U with PCP.    # follow up in 6 months-labs/ She was given a copy of her labs.       No orders of the defined types were placed in this encounter.  Mauro Kaufmann, NP 08/19/2016 10:39 AM

## 2016-08-19 NOTE — Progress Notes (Signed)
Patient here today for follow up.  Patient c/o hair thinning and edema in ankles

## 2016-12-03 ENCOUNTER — Other Ambulatory Visit: Payer: Self-pay | Admitting: *Deleted

## 2016-12-03 DIAGNOSIS — D696 Thrombocytopenia, unspecified: Secondary | ICD-10-CM

## 2016-12-03 NOTE — Telephone Encounter (Signed)
Patient called back and reported that she will now use [facility]  Fax # [phone]

## 2016-12-03 NOTE — Telephone Encounter (Signed)
Has a new insurance and needs a new authorization for her med. Did not states if the pharmacy is changed. I have a call in to ask her which pharmacy.

## 2016-12-04 MED ORDER — ELTROMBOPAG OLAMINE 50 MG PO TABS: 50.0000 mg | ORAL_TABLET | ORAL | 6 refills | Status: DC

## 2017-01-13 ENCOUNTER — Telehealth: Payer: Self-pay | Admitting: *Deleted

## 2017-01-13 NOTE — Telephone Encounter (Signed)
Received notification from [facility] promacta. rx currently in process for benefit verification.

## 2017-01-14 NOTE — Telephone Encounter (Signed)
Completed Prior Authorization for Promacta via phone.  PA is marked as urgent and will be notified of approval/denial within 24 hours.

## 2017-01-16 ENCOUNTER — Encounter: Payer: Self-pay | Admitting: *Deleted

## 2017-01-16 NOTE — Progress Notes (Signed)
Received notification from [facility]- Val Rilesromacta has been approved by pt's insurance. 01/14/17- Until 07/16/2017. I faxed this to the patient's speciality pharmacy to notify them of approval so that drug shipment could be arranged.

## 2017-02-17 ENCOUNTER — Other Ambulatory Visit: Payer: 59

## 2017-02-17 ENCOUNTER — Ambulatory Visit: Payer: 59 | Admitting: Internal Medicine

## 2017-02-20 ENCOUNTER — Other Ambulatory Visit: Payer: Self-pay | Admitting: *Deleted

## 2017-02-20 DIAGNOSIS — D693 Immune thrombocytopenic purpura: Secondary | ICD-10-CM

## 2017-02-24 ENCOUNTER — Inpatient Hospital Stay: Payer: BLUE CROSS/BLUE SHIELD | Attending: Internal Medicine | Admitting: Internal Medicine

## 2017-02-24 ENCOUNTER — Inpatient Hospital Stay: Payer: BLUE CROSS/BLUE SHIELD

## 2017-02-24 VITALS — BP 117/75 | HR 67 | Temp 98.1°F | Wt 238.9 lb

## 2017-02-24 DIAGNOSIS — Z9081 Acquired absence of spleen: Secondary | ICD-10-CM | POA: Diagnosis not present

## 2017-02-24 DIAGNOSIS — Z79899 Other long term (current) drug therapy: Secondary | ICD-10-CM | POA: Diagnosis not present

## 2017-02-24 DIAGNOSIS — D693 Immune thrombocytopenic purpura: Secondary | ICD-10-CM

## 2017-02-24 DIAGNOSIS — R600 Localized edema: Secondary | ICD-10-CM

## 2017-02-24 LAB — CBC WITH DIFFERENTIAL/PLATELET
BASOS PCT: 3 %
Basophils Absolute: 0.1 10*3/uL (ref 0–0.1)
EOS ABS: 0.3 10*3/uL (ref 0–0.7)
Eosinophils Relative: 5 %
HEMATOCRIT: 38.1 % (ref 35.0–47.0)
HEMOGLOBIN: 12.9 g/dL (ref 12.0–16.0)
LYMPHS ABS: 1.6 10*3/uL (ref 1.0–3.6)
Lymphocytes Relative: 27 %
MCH: 30.8 pg (ref 26.0–34.0)
MCHC: 33.9 g/dL (ref 32.0–36.0)
MCV: 90.8 fL (ref 80.0–100.0)
MONOS PCT: 12 %
Monocytes Absolute: 0.7 10*3/uL (ref 0.2–0.9)
NEUTROS ABS: 3.2 10*3/uL (ref 1.4–6.5)
NEUTROS PCT: 53 %
Platelets: 217 10*3/uL (ref 150–440)
RBC: 4.2 MIL/uL (ref 3.80–5.20)
RDW: 13.6 % (ref 11.5–14.5)
WBC: 5.9 10*3/uL (ref 3.6–11.0)

## 2017-02-24 LAB — COMPREHENSIVE METABOLIC PANEL
ALBUMIN: 3.9 g/dL (ref 3.5–5.0)
ALK PHOS: 65 U/L (ref 38–126)
ALT: 31 U/L (ref 14–54)
AST: 33 U/L (ref 15–41)
Anion gap: 7 (ref 5–15)
BILIRUBIN TOTAL: 0.5 mg/dL (ref 0.3–1.2)
BUN: 17 mg/dL (ref 6–20)
CALCIUM: 8.8 mg/dL — AB (ref 8.9–10.3)
CO2: 26 mmol/L (ref 22–32)
CREATININE: 1.15 mg/dL — AB (ref 0.44–1.00)
Chloride: 104 mmol/L (ref 101–111)
GFR calc Af Amer: 60 mL/min — ABNORMAL LOW (ref >60)
GFR calc non Af Amer: 51 mL/min — ABNORMAL LOW (ref >60)
GLUCOSE: 101 mg/dL — AB (ref 65–99)
Potassium: 4.1 mmol/L (ref 3.5–5.1)
Sodium: 137 mmol/L (ref 135–145)
TOTAL PROTEIN: 7.2 g/dL (ref 6.5–8.1)

## 2017-02-24 NOTE — Progress Notes (Signed)
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   She continues to deny any bloody nose or bloody gums or petechial rash. Denies any other mucosal bleeding.  She complains of swelling in bilateral lower extremities. She has been on when necessary Lasix to her PCP.    REVIEW OF SYSTEMS:  A complete 10 point review of system is done with his negative except mentioned above in history of present illness.   I have reviewed the past medical history, past surgical history, social history and family history with the patient and they are unchanged from previous note unless stated above.  ALLERGIES:  is allergic to nsaids; aspirin; and citrus.  MEDICATIONS:  Current Outpatient Prescriptions  Medication Sig Dispense Refill  . acetaminophen (TYLENOL ARTHRITIS PAIN) 650 MG CR tablet Take 650 mg by mouth 2 (two) times daily as needed for pain.    Marland Kitchen. buPROPion (WELLBUTRIN SR) 150 MG 12 hr tablet Take 150 mg by mouth daily.     . Calcium Carbonate-Vitamin D (CALCIUM + D PO) Take 1 tablet by mouth daily.    . Cetirizine HCl 10 MG CAPS Take 1 capsule by mouth as needed. Seasonal allergies    . eltrombopag (PROMACTA) 50 MG tablet Take 1 tablet (50 mg total) by mouth every other day. Take on an empty stomach 1 hour before a meal or 2 hours after 45 tablet 6  . Melatonin 10 MG CAPS Take 10 mg by mouth at bedtime as needed.     . Multiple Vitamin (MULTIVITAMIN WITH MINERALS) TABS tablet Take 1 tablet by  mouth daily.    . Naphazoline HCl (CLEAR EYES OP) Place 1 drop into both eyes daily.    . NON FORMULARY Use as directed 1 scoop in the mouth or throat daily. IT WORKS! (The Greens) pt mixes in water or fruit juice    . Omega-3 1000 MG CAPS Take by mouth.    . furosemide (LASIX) 40 MG tablet Take 40 mg by mouth daily.      No current facility-administered medications for this visit.     PHYSICAL EXAMINATION:   BP 117/75 (BP Location: Right Arm)   Pulse 67   Temp 98.1 F (36.7 C) (Tympanic)   Wt 238 lb 13.9 oz (108.3 kg)   BMI 39.75 kg/m   Filed Weights   02/24/17 0945 02/24/17 0948  Weight: 238 lb 13.9 oz (108.3 kg) 238 lb 13.9 oz (108.3 kg)    GENERAL:alert, no distress and comfortable. Patient alone. She is obese. EYES: normal, Conjunctiva are pink and non-injected, sclera clear OROPHARYNX:no exudate, no erythema and lips, buccal mucosa, and tongue normal  NECK: No thyromegaly LYMPH:  no palpable lymphadenopathy in the cervical, axillary or inguinal LUNGS: clear to auscultation with normal breathing effort; No Wheeze or crackles  Cardio-vascular- Regular Rate & Rythm and no murmurs. +1 pitting edema in bilateral lower extremities ABDOMEN:abdomen soft, non-tender and normal bowel sounds; No hepato-splenomegaly.  Musculoskeletal:no cyanosis of digits and no clubbing  NEURO: alert & oriented  x 3 with fluent speech, no focal motor/sensory deficits. SKIN: no skin rash   LABORATORY DATA:  I have reviewed the data as listed    Component Value Date/Time   NA 137 02/24/2017 0927   NA 142 03/30/2012 1120   K 4.1 02/24/2017 0927   K 3.9 03/30/2012 1120   CL 104 02/24/2017 0927   CL 106 03/30/2012 1120   CO2 26 02/24/2017 0927   CO2 28 03/30/2012 1120   GLUCOSE 101 (H) 02/24/2017 0927   GLUCOSE 119 (H) 03/30/2012 1120   BUN 17 02/24/2017 0927   BUN 16 03/30/2012 1120   CREATININE 1.15 (H) 02/24/2017 0927   CREATININE 1.13 03/30/2012 1120   CALCIUM 8.8 (L) 02/24/2017 0927    CALCIUM 9.4 03/30/2012 1120   PROT 7.2 02/24/2017 0927   PROT 7.2 03/30/2012 1120   ALBUMIN 3.9 02/24/2017 0927   ALBUMIN 3.9 03/30/2012 1120   AST 33 02/24/2017 0927   AST 28 03/30/2012 1120   ALT 31 02/24/2017 0927   ALT 47 03/30/2012 1120   ALKPHOS 65 02/24/2017 0927   ALKPHOS 105 03/30/2012 1120   BILITOT 0.5 02/24/2017 0927   BILITOT 0.4 03/30/2012 1120   GFRNONAA 51 (L) 02/24/2017 0927   GFRNONAA 55 (L) 03/30/2012 1120   GFRAA 60 (L) 02/24/2017 0927   GFRAA >60 03/30/2012 1120    No results found for: SPEP, UPEP  Lab Results  Component Value Date   WBC 5.9 02/24/2017   NEUTROABS 3.2 02/24/2017   HGB 12.9 02/24/2017   HCT 38.1 02/24/2017   MCV 90.8 02/24/2017   PLT 217 02/24/2017      Chemistry      Component Value Date/Time   NA 137 02/24/2017 0927   NA 142 03/30/2012 1120   K 4.1 02/24/2017 0927   K 3.9 03/30/2012 1120   CL 104 02/24/2017 0927   CL 106 03/30/2012 1120   CO2 26 02/24/2017 0927   CO2 28 03/30/2012 1120   BUN 17 02/24/2017 0927   BUN 16 03/30/2012 1120   CREATININE 1.15 (H) 02/24/2017 0927   CREATININE 1.13 03/30/2012 1120      Component Value Date/Time   CALCIUM 8.8 (L) 02/24/2017 0927   CALCIUM 9.4 03/30/2012 1120   ALKPHOS 65 02/24/2017 0927   ALKPHOS 105 03/30/2012 1120   AST 33 02/24/2017 0927   AST 28 03/30/2012 1120   ALT 31 02/24/2017 0927   ALT 47 03/30/2012 1120   BILITOT 0.5 02/24/2017 0927   BILITOT 0.4 03/30/2012 1120        ASSESSMENT & PLAN:   Idiopathic thrombocytopenic purpura (HCC) Chronic refractory ITP status post multiple lines of therapy; steroid responsive but intolerant. Patient on Promacta since October 2016.   # Patient is currently on 50 mg every other day; platelet count today is 216; Lfts-Normal.  Given the good response to Promacta- I would continue current dose. Patient tolerating it well.  # Bilateral lower extremity edema- +1 pitting edema. Encouraged elevation/stockings.  F/U with PCP.   #  Elevated creatinine-1.15- stable.  Encouraged her to drink plenty of fluids. Re: lasix. F/U with PCP.    # follow up in 6 months-labs/ She was given a copy of her labs.   Cc; Dr.Theis.      Orders Placed This Encounter  Procedures  . CBC with Differential    Standing Status:   Future    Standing Expiration Date:   02/24/2018  . Comprehensive metabolic panel    Standing  Status:   Future    Standing Expiration Date:   02/24/2018        Earna Coder, MD 02/24/2017 6:27 PM

## 2017-02-24 NOTE — Assessment & Plan Note (Addendum)
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 Patient on Promacta since October 2016.   # Patient is currently on 50 mg every other day; platelet count today is 216; Lfts-Normal.  Given the good response to Promacta- I would continue current dose. Patient tolerating it well.  # Bilateral lower extremity edema- +1 pitting edema. Encouraged elevation/stockings.  F/U with PCP.   # Elevated creatinine-1.15- stable.  Encouraged her to drink plenty of fluids. Re: lasix. F/U with PCP.    # follow up in 6 months-labs/ She was given a copy of her labs.   Cc; Dr.Theis.

## 2017-05-27 DIAGNOSIS — M67911 Unspecified disorder of synovium and tendon, right shoulder: Secondary | ICD-10-CM | POA: Insufficient documentation

## 2017-06-16 ENCOUNTER — Other Ambulatory Visit: Payer: Self-pay | Admitting: Obstetrics and Gynecology

## 2017-06-16 DIAGNOSIS — Z1231 Encounter for screening mammogram for malignant neoplasm of breast: Secondary | ICD-10-CM

## 2017-06-17 ENCOUNTER — Telehealth: Payer: Self-pay | Admitting: Internal Medicine

## 2017-06-17 NOTE — Telephone Encounter (Signed)
[name] Encounter  Received fax from [facility] for a PA for Promacta. Completed and had Dr. Donneta RombergBrahmanday sign and faxed to [phone].   [facility] [phone] 06/17/2017 12:59 PM

## 2017-06-19 NOTE — Telephone Encounter (Signed)
[name] Encounter  Prior Authorization for Victoria Rilesromacta has been approved.    PA# [id number] FN Effective dates: 06/18/2017 through 06/18/2018  Oral Oncology Clinic will continue to follow.   Victoria [facility] Patient Advocate [phone] 06/19/2017 9:47 AM

## 2017-07-06 ENCOUNTER — Ambulatory Visit: Payer: Self-pay | Admitting: Obstetrics and Gynecology

## 2017-07-22 ENCOUNTER — Ambulatory Visit: Payer: BLUE CROSS/BLUE SHIELD

## 2017-08-06 ENCOUNTER — Other Ambulatory Visit: Payer: Self-pay

## 2017-08-06 ENCOUNTER — Ambulatory Visit
Admission: EM | Admit: 2017-08-06 | Discharge: 2017-08-06 | Disposition: A | Discharge status: Home / Self Care | Payer: BLUE CROSS/BLUE SHIELD | Attending: Family Medicine | Admitting: Family Medicine

## 2017-08-06 DIAGNOSIS — R05 Cough: Secondary | ICD-10-CM | POA: Diagnosis not present

## 2017-08-06 DIAGNOSIS — J209 Acute bronchitis, unspecified: Secondary | ICD-10-CM

## 2017-08-06 MED ORDER — BENZONATATE 100 MG PO CAPS: 100.0000 mg | ORAL_CAPSULE | Freq: Three times a day (TID) | ORAL | 0 refills | Status: DC | PRN

## 2017-08-06 MED ORDER — ALBUTEROL SULFATE HFA 108 (90 BASE) MCG/ACT IN AERS: 1.0000 | INHALATION_SPRAY | Freq: Four times a day (QID) | RESPIRATORY_TRACT | 0 refills | Status: DC | PRN

## 2017-08-06 NOTE — ED Triage Notes (Signed)
Pt with cough x one week sometimes productive of yellow phlegm. Feels nasal congestion and wheezing when she lays down. Denies pain and denies fever. "I think it's my yearly bronchitis."

## 2017-08-06 NOTE — Discharge Instructions (Signed)
This is viral.  Does not require antibiotics.  Use the cough medication as needed.  Use the albuterol at night.  Take care  Dr. Adriana Simasook

## 2017-08-06 NOTE — ED Provider Notes (Signed)
[facility]    CSN: [id number] Arrival date & time: 08/06/17  1530  History   Chief Complaint Chief Complaint  Patient presents with  . Cough   HPI  61 year old female presents with cough.  Patient states she has been sick for the past week.  She has had an intermittent productive cough.  She states that she does not have much cough currently.  She has wheezing at night.  No fevers or chills.  Worse at night.  No known relieving factors.  No shortness of breath.  No other associated symptoms.  She is concerned that she has bronchitis.  No medications tried.  No other complaints at this time.  Past Medical History:  Diagnosis Date  . Asthma   . Blood dyscrasia    ITP   dx in 1990  . History of arthritis    in hips; now resolved after hip surgery  . 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 Idiopathic thrombocytopenic purpura (HCC) 05/01/2015  . 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 H/O splenectomy 12/07/2014  . Osteoarthritis of left hip 05/15/2014  . Accumulation of fluid in tissues 03/09/2014  . Chronic LBP 03/09/2014  . Dermatitis, eczematoid 03/09/2014  . Hypercholesterolemia without hypertriglyceridemia 03/09/2014  . AS (sickle cell trait) (HCC) 03/09/2014  . Osteoarthritis of right hip 11/28/2013  . Acute blood loss anemia 11/28/2013  . Thrombocytopenia (HCC) 11/28/2013   Past Surgical History:  Procedure Laterality Date  . HAND SURGERY     right hand  . spleenectomy  1991   x 2  . TOTAL HIP ARTHROPLASTY Right 11/28/2013   Procedure: TOTAL HIP ARTHROPLASTY ANTERIOR APPROACH;  Surgeon: Harvie JuniorJohn L Graves, MD;  Location: [facility] OR;  Service: Orthopedics;  Laterality: Right;  Right total hip arthroplasty anterior approach  . TOTAL HIP ARTHROPLASTY Left 05/15/2014   Procedure: TOTAL HIP ARTHROPLASTY ANTERIOR APPROACH;  Surgeon: Harvie JuniorJohn L Graves, MD;  Location: [facility] OR;  Service: Orthopedics;  Laterality: Left;   . TUBAL LIGATION     OB History    No data available     Home Medications    Prior to Admission medications   Medication Sig Start Date End Date Taking? Authorizing Provider  acetaminophen (TYLENOL ARTHRITIS PAIN) 650 MG CR tablet Take 650 mg by mouth 2 (two) times daily as needed for pain.    [provider]  albuterol (PROVENTIL HFA;VENTOLIN HFA) 108 (90 Base) MCG/ACT inhaler Inhale 1-2 puffs into the lungs every 6 (six) hours as needed for wheezing or shortness of breath. 08/06/17   Tommie Samsook, Annemarie Sebree G, DO  benzonatate (TESSALON) 100 MG capsule Take 1 capsule (100 mg total) by mouth 3 (three) times daily as needed. 08/06/17   Tommie Samsook, Lena Fieldhouse G, DO  buPROPion (WELLBUTRIN SR) 150 MG 12 hr tablet Take 150 mg by mouth daily.  02/15/16   [provider]  Calcium Carbonate-Vitamin D (CALCIUM + D PO) Take 1 tablet by mouth daily.    [provider]  Cetirizine HCl 10 MG CAPS Take 1 capsule by mouth as needed. Seasonal allergies    [provider]  eltrombopag (PROMACTA) 50 MG tablet Take 1 tablet (50 mg total) by mouth every other day. Take on an empty stomach 1 hour before a meal or 2 hours after 12/04/16   Earna CoderBrahmanday, Govinda R, MD  furosemide (LASIX) 40 MG tablet Take 40 mg by mouth daily.  12/07/14 12/07/15  [provider]  Melatonin 10 MG CAPS Take 10 mg by mouth at bedtime as needed.     [provider]  Multiple Vitamin (MULTIVITAMIN WITH MINERALS) TABS tablet Take 1 tablet by mouth daily.    [provider]  Naphazoline HCl (CLEAR EYES OP) Place 1 drop into both eyes daily.    [provider]  NON FORMULARY Use as directed 1 scoop in the mouth or throat daily. IT WORKS! (The Greens) pt mixes in water or fruit juice    [provider]  Omega-3 1000 MG CAPS Take by mouth.    [provider]    Family History Family History  Problem Relation Age of Onset  . Lung disease Mother   . Breast cancer Paternal Aunt  80       x 2  . Prostate cancer Paternal Uncle 5560  . Bone cancer Paternal Uncle 60  . Brain cancer Paternal Uncle 1783  . Lung cancer Paternal Uncle 3060    Social History Social History   Tobacco Use  . Smoking status: Never Smoker  . Smokeless tobacco: Never Used  Substance Use Topics  . Alcohol use: Yes    Alcohol/week: 0.0 oz    Comment: occasionally  . Drug use: No     Allergies   Nsaids; Aspirin; and Citrus   Review of Systems Review of Systems  Constitutional: Negative.   Respiratory: Positive for cough, chest tightness and wheezing.    Physical Exam Triage Vital Signs ED Triage Vitals  Enc Vitals Group     BP 08/06/17 1543 118/63     Pulse Rate 08/06/17 1543 71     Resp 08/06/17 1543 18     Temp 08/06/17 1543 98.4 F (36.9 C)     Temp Source 08/06/17 1543 Oral     SpO2 08/06/17 1543 100 %     Weight 08/06/17 1543 240 lb (108.9 kg)     Height 08/06/17 1543 5\' 4"  (1.626 m)     Head Circumference --      Peak Flow --      Pain Score 08/06/17 1607 2     Pain Loc --      Pain Edu? --      Excl. in GC? --    No data found.  Updated Vital Signs BP 118/63 (BP Location: Left Arm)   Pulse 71   Temp 98.4 F (36.9 C) (Oral)   Resp 18   Ht 5\' 4"  (1.626 m)   Wt 240 lb (108.9 kg)   SpO2 100%   BMI 41.20 kg/m   Physical Exam  Constitutional: She is oriented to person, place, and time. She appears well-developed. No distress.  HENT:  Head: Normocephalic and atraumatic.  Mouth/Throat: Oropharynx is clear and moist.  Cardiovascular: Normal rate and regular rhythm.  Pulmonary/Chest: Effort normal and breath sounds normal. She has no wheezes. She has no rales.  Neurological: She is alert and oriented to person, place, and time.  Psychiatric: She has a normal mood and affect. Her behavior is normal.  Nursing note and vitals reviewed.   UC Treatments / Results  Labs (all labs ordered are listed, but only abnormal results are displayed) Labs Reviewed - No  data to display  EKG  EKG Interpretation None       Radiology No results found.  Procedures Procedures (including critical care time)  Medications Ordered in UC Medications - No data to display   Initial Impression / Assessment and Plan / UC  Course  I have reviewed the triage vital signs and the nursing notes.  Pertinent labs & imaging results that were available during my care of the patient were reviewed by me and considered in my medical decision making (see chart for details).   61 year old female presents with acute bronchitis.  No indication for antibiotics at this time.  Treating with albuterol and Tessalon Perles  Final Clinical Impressions(s) / UC Diagnoses   Final diagnoses:  Acute bronchitis, unspecified organism    ED Discharge Orders        Ordered    albuterol (PROVENTIL HFA;VENTOLIN HFA) 108 (90 Base) MCG/ACT inhaler  Every 6 hours PRN     08/06/17 1604    benzonatate (TESSALON) 100 MG capsule  3 times daily PRN     08/06/17 1604     Controlled Substance Prescriptions [place] Controlled Substance Registry consulted? Not Applicable   Tommie Sams, DO 08/06/17 [id number]

## 2017-08-12 ENCOUNTER — Ambulatory Visit
Admission: RE | Admit: 2017-08-12 | Discharge: 2017-08-12 | Disposition: A | Discharge status: Home / Self Care | Payer: BLUE CROSS/BLUE SHIELD | Source: Ambulatory Visit | Attending: Obstetrics and Gynecology | Admitting: Obstetrics and Gynecology

## 2017-08-12 ENCOUNTER — Ambulatory Visit (INDEPENDENT_AMBULATORY_CARE_PROVIDER_SITE_OTHER): Payer: BLUE CROSS/BLUE SHIELD | Admitting: Obstetrics and Gynecology

## 2017-08-12 ENCOUNTER — Encounter: Payer: Self-pay | Admitting: Obstetrics and Gynecology

## 2017-08-12 VITALS — BP 138/80 | HR 84 | Ht 65.0 in | Wt 235.0 lb

## 2017-08-12 DIAGNOSIS — Z1231 Encounter for screening mammogram for malignant neoplasm of breast: Secondary | ICD-10-CM

## 2017-08-12 DIAGNOSIS — Z01419 Encounter for gynecological examination (general) (routine) without abnormal findings: Secondary | ICD-10-CM

## 2017-08-12 DIAGNOSIS — Z1211 Encounter for screening for malignant neoplasm of colon: Secondary | ICD-10-CM

## 2017-08-12 DIAGNOSIS — Z1239 Encounter for other screening for malignant neoplasm of breast: Secondary | ICD-10-CM

## 2017-08-12 NOTE — Progress Notes (Signed)
PCP: Mickey Farber, MD   Chief Complaint  Patient presents with  . Gynecologic Exam    HPI:      Ms. Victoria Morgan is a 62 y.o. No obstetric history on file. who LMP was No LMP recorded. Patient is postmenopausal., presents today for her annual examination.  Her menses are absent and she is postmenopausal. She does not have intermenstrual bleeding.  She does not have vasomotor sx.  Sex activity: single partner, contraception - post menopausal status. She does not have vaginal dryness. Uses lubricants prn.  Last Pap: July 01, 2016  Results were: no abnormalities /neg HPV DNA.  Hx of STDs: none  Last mammogram: today, awaiting results. There is a FH of breast cancer in her pat aunt, genetic testing not indicated. There is no FH of ovarian cancer. The patient does do self-breast exams.  Colonoscopy: colonoscopy 10 years ago without abnormalities. . Repeat due after 10 years (2019).   Tobacco use: The patient denies current or previous tobacco use. Alcohol use: social drinker Exercise: moderately active  She does get adequate calcium and Vitamin D in her diet.  Labs with PCP.    Past Medical History:  Diagnosis Date  . Asthma   . Blood dyscrasia    ITP   dx in 1990  . History of arthritis    in hips; now resolved after hip surgery  . 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 HAND SURGERY     right hand  . spleenectomy  1991   x 2  . TOTAL HIP ARTHROPLASTY Right 11/28/2013   Procedure: TOTAL HIP ARTHROPLASTY ANTERIOR APPROACH;  Surgeon: Harvie Junior, MD;  Location: [facility] OR;  Service: Orthopedics;  Laterality: Right;  Right total hip arthroplasty anterior approach  . TOTAL HIP ARTHROPLASTY Left 05/15/2014   Procedure: TOTAL HIP ARTHROPLASTY ANTERIOR APPROACH;  Surgeon: Harvie Junior, MD;  Location: [facility] OR;  Service: Orthopedics;  Laterality: Left;  . TUBAL LIGATION      Family History    Problem Relation Age of Onset  . Lung disease Mother   . Breast cancer Paternal Aunt 80       x 2 (first time late 61s), same breast  . Prostate cancer Paternal Uncle 70  . Bone cancer Paternal Uncle 60  . Brain cancer Paternal Uncle 59  . Lung cancer Paternal Uncle 37  . Asthma Father     Social History   Socioeconomic History  . Marital status: Married    Spouse name: Not on file  . Number of children: Not on file  . Years of education: Not on file  . Highest education level: Not on file  Social Needs  . Financial resource strain: Not on file  . Food insecurity - worry: Not on file  . Food insecurity - inability: Not on file  . Transportation needs - medical: Not on file  . Transportation needs - non-medical: Not on file  Occupational History  . Not on file  Tobacco Use  . Smoking status: Never Smoker  . Smokeless tobacco: Never Used  Substance and Sexual Activity  . Alcohol use: Yes    Alcohol/week: 0.0 oz    Comment: occasionally  . Drug use: No  . Sexual activity: Yes    Partners: Male  Other Topics Concern  . Not on file  Social History Narrative  . Not on file    No outpatient medications have  been marked as taking for the 08/12/17 encounter (Office Visit) with Cybil Senegal, Ilona SorrelAlicia B, PA-C.      ROS:  Review of Systems  Constitutional: Negative for fatigue, fever and unexpected weight change.  Respiratory: Positive for cough. Negative for shortness of breath and wheezing.   Cardiovascular: Negative for chest pain, palpitations and leg swelling.  Gastrointestinal: Negative for blood in stool, constipation, diarrhea, nausea and vomiting.  Endocrine: Negative for cold intolerance, heat intolerance and polyuria.  Genitourinary: Positive for urgency. Negative for dyspareunia, dysuria, flank pain, frequency, genital sores, hematuria, menstrual problem, pelvic pain, vaginal bleeding, vaginal discharge and vaginal pain.  Musculoskeletal: Positive for arthralgias.  Negative for back pain, joint swelling and myalgias.  Skin: Negative for rash.  Neurological: Negative for dizziness, syncope, light-headedness, numbness and headaches.  Hematological: Negative for adenopathy.  Psychiatric/Behavioral: Negative for agitation, confusion, sleep disturbance and suicidal ideas. The patient is not nervous/anxious.      Objective: BP 138/80   Pulse 84   Ht 5\' 5"  (1.651 m)   Wt 235 lb (106.6 kg)   BMI 39.11 kg/m    Physical Exam  Constitutional: She is oriented to person, place, and time. She appears well-developed and well-nourished.  Genitourinary: Vagina normal and uterus normal. There is no rash or tenderness on the right labia. There is no rash or tenderness on the left labia. No erythema or tenderness in the vagina. No vaginal discharge found. Right adnexum does not display mass and does not display tenderness. Left adnexum does not display mass and does not display tenderness. Cervix does not exhibit motion tenderness or polyp. Uterus is not enlarged or tender.  Neck: Normal range of motion. No thyromegaly present.  Cardiovascular: Normal rate, regular rhythm and normal heart sounds.  No murmur heard. Pulmonary/Chest: Effort normal and breath sounds normal. Right breast exhibits no mass, no nipple discharge, no skin change and no tenderness. Left breast exhibits no mass, no nipple discharge, no skin change and no tenderness.  Abdominal: Soft. There is no tenderness. There is no guarding.  Musculoskeletal: Normal range of motion.  Neurological: She is alert and oriented to person, place, and time. No cranial nerve deficit.  Psychiatric: She has a normal mood and affect. Her behavior is normal.  Vitals reviewed.   Assessment/Plan:  Encounter for annual routine gynecological examination  Screening for breast cancer - Mammo done today. Awaiting results.   Screening for colon cancer - Pt to sched f/u colonoscopy this yr.         GYN counsel breast  self exam, mammography screening, menopause, adequate intake of calcium and vitamin D, diet and exercise    F/U  Return in about 1 year (around 08/12/2018).  Chellsea Beckers B. Adeleine Pask, PA-C 08/12/2017 4:24 PM

## 2017-08-12 NOTE — Patient Instructions (Signed)
I value your feedback and entrusting us with your care. If you get a [facility] patient survey, I would appreciate you taking the time to let us know about your experience today. Thank you! 

## 2017-08-13 ENCOUNTER — Encounter: Payer: Self-pay | Admitting: Obstetrics and Gynecology

## 2017-08-25 ENCOUNTER — Other Ambulatory Visit: Payer: Self-pay

## 2017-08-25 ENCOUNTER — Encounter: Payer: Self-pay | Admitting: Internal Medicine

## 2017-08-25 ENCOUNTER — Inpatient Hospital Stay: Payer: BLUE CROSS/BLUE SHIELD | Attending: Internal Medicine | Admitting: Internal Medicine

## 2017-08-25 ENCOUNTER — Inpatient Hospital Stay: Payer: BLUE CROSS/BLUE SHIELD

## 2017-08-25 VITALS — BP 129/79 | HR 75 | Temp 98.7°F | Resp 16 | Wt 240.9 lb

## 2017-08-25 DIAGNOSIS — D693 Immune thrombocytopenic purpura: Secondary | ICD-10-CM

## 2017-08-25 DIAGNOSIS — Z9081 Acquired absence of spleen: Secondary | ICD-10-CM | POA: Insufficient documentation

## 2017-08-25 DIAGNOSIS — Z79899 Other long term (current) drug therapy: Secondary | ICD-10-CM | POA: Diagnosis not present

## 2017-08-25 LAB — CBC WITH DIFFERENTIAL/PLATELET
BASOS ABS: 0.1 10*3/uL (ref 0–0.1)
BASOS PCT: 1 %
EOS ABS: 0.2 10*3/uL (ref 0–0.7)
EOS PCT: 4 %
HCT: 37.2 % (ref 35.0–47.0)
Hemoglobin: 12.8 g/dL (ref 12.0–16.0)
Lymphocytes Relative: 22 %
Lymphs Abs: 1.2 10*3/uL (ref 1.0–3.6)
MCH: 31.3 pg (ref 26.0–34.0)
MCHC: 34.4 g/dL (ref 32.0–36.0)
MCV: 90.8 fL (ref 80.0–100.0)
Monocytes Absolute: 0.6 10*3/uL (ref 0.2–0.9)
Monocytes Relative: 11 %
Neutro Abs: 3.3 10*3/uL (ref 1.4–6.5)
Neutrophils Relative %: 62 %
PLATELETS: 205 10*3/uL (ref 150–440)
RBC: 4.09 MIL/uL (ref 3.80–5.20)
RDW: 13.8 % (ref 11.5–14.5)
WBC: 5.3 10*3/uL (ref 3.6–11.0)

## 2017-08-25 LAB — COMPREHENSIVE METABOLIC PANEL
ALT: 29 U/L (ref 14–54)
AST: 31 U/L (ref 15–41)
Albumin: 3.9 g/dL (ref 3.5–5.0)
Alkaline Phosphatase: 67 U/L (ref 38–126)
Anion gap: 7 (ref 5–15)
BILIRUBIN TOTAL: 0.8 mg/dL (ref 0.3–1.2)
BUN: 16 mg/dL (ref 6–20)
CALCIUM: 8.9 mg/dL (ref 8.9–10.3)
CO2: 26 mmol/L (ref 22–32)
CREATININE: 1.05 mg/dL — AB (ref 0.44–1.00)
Chloride: 105 mmol/L (ref 101–111)
GFR calc Af Amer: >60 mL/min (ref >60)
GFR, EST NON AFRICAN AMERICAN: 57 mL/min — AB (ref >60)
Glucose, Bld: 110 mg/dL — ABNORMAL HIGH (ref 65–99)
Potassium: 3.5 mmol/L (ref 3.5–5.1)
Sodium: 138 mmol/L (ref 135–145)
TOTAL PROTEIN: 7.1 g/dL (ref 6.5–8.1)

## 2017-08-25 NOTE — Progress Notes (Signed)
[facility] OFFICE PROGRESS NOTE  Patient Care Team: Mickey Farber, MD as PCP - General (Internal Medicine)  HPI  SUMMARY of HEMATOLOGIC HISTORY:   # 1990 ITP- s/p Splenectomy x 2 [1991]; VCR [1998]; Win Rho; Rituxan x4 [2003]; N-Plate- responsive [2010-2012]; Steroid Responsive/Intolerant; OCT 2016- START PROMACTA 50mg ; currently on 50mg  qOD  INTERVAL HISTORY:  This is a very pleasant 62 year-old African-American female patient with a history of chronic refractory ITP currently on Promacta 50 mg every other day is here for follow-up.   She continues to deny any bloody nose or bloody gums or petechial rash. Denies any other mucosal bleeding.  No skin rash.  REVIEW OF SYSTEMS:  A complete 10 point review of system is done with his negative except mentioned above in history of present illness.   I have reviewed the past medical history, past surgical history, social history and family history with the patient and they are unchanged from previous note unless stated above.  ALLERGIES:  is allergic to nsaids; aspirin; and citrus.  MEDICATIONS:  Current Outpatient Medications  Medication Sig Dispense Refill  . acetaminophen (TYLENOL ARTHRITIS PAIN) 650 MG CR tablet Take 650 mg by mouth 2 (two) times daily as needed for pain.    Marland Kitchen albuterol (PROVENTIL HFA;VENTOLIN HFA) 108 (90 Base) MCG/ACT inhaler Inhale 1-2 puffs into the lungs every 6 (six) hours as needed for wheezing or shortness of breath. 1 Inhaler 0  . benzonatate (TESSALON) 100 MG capsule Take 1 capsule (100 mg total) by mouth 3 (three) times daily as needed. 30 capsule 0  . buPROPion (WELLBUTRIN SR) 150 MG 12 hr tablet Take 150 mg by mouth daily.     . Calcium Carbonate-Vitamin D (CALCIUM + D PO) Take 1 tablet by mouth daily.    . Cetirizine HCl 10 MG CAPS Take 1 capsule by mouth as needed. Seasonal allergies    . eltrombopag (PROMACTA) 50 MG tablet Take 1 tablet (50 mg total) by mouth every other day. Take on an  empty stomach 1 hour before a meal or 2 hours after 45 tablet 6  . Melatonin 10 MG CAPS Take 10 mg by mouth at bedtime as needed.     . meloxicam (MOBIC) 15 MG tablet Take 15 mg by mouth daily.    . Multiple Vitamin (MULTIVITAMIN WITH MINERALS) TABS tablet Take 1 tablet by mouth daily.    . Naphazoline HCl (CLEAR EYES OP) Place 1 drop into both eyes daily.    . NON FORMULARY Use as directed 1 scoop in the mouth or throat daily. IT WORKS! (The Greens) pt mixes in water or fruit juice    . Omega-3 1000 MG CAPS Take by mouth.    . furosemide (LASIX) 40 MG tablet Take 40 mg by mouth daily.      No current facility-administered medications for this visit.     PHYSICAL EXAMINATION:   BP 129/79 (BP Location: Left Arm, Patient Position: Sitting)   Pulse 75   Temp 98.7 F (37.1 C) (Tympanic)   Resp 16   Wt 240 lb 13.6 oz (109.2 kg)   BMI 40.08 kg/m   Filed Weights   08/25/17 0912  Weight: 240 lb 13.6 oz (109.2 kg)    GENERAL:alert, no distress and comfortable. Patient alone. She is obese. EYES: normal, Conjunctiva are pink and non-injected, sclera clear OROPHARYNX:no exudate, no erythema and lips, buccal mucosa, and tongue normal  NECK: No thyromegaly LYMPH:  no palpable lymphadenopathy in the cervical,  axillary or inguinal LUNGS: clear to auscultation with normal breathing effort; No Wheeze or crackles  Cardio-vascular- Regular Rate & Rythm and no murmurs. +1 pitting edema in bilateral lower extremities ABDOMEN:abdomen soft, non-tender and normal bowel sounds; No hepato-splenomegaly.  Musculoskeletal:no cyanosis of digits and no clubbing  NEURO: alert & oriented x 3 with fluent speech, no focal motor/sensory deficits. SKIN: no skin rash   LABORATORY DATA:  I have reviewed the data as listed    Component Value Date/Time   NA 138 08/25/2017 0850   NA 142 03/30/2012 1120   K 3.5 08/25/2017 0850   K 3.9 03/30/2012 1120   CL 105 08/25/2017 0850   CL 106 03/30/2012 1120   CO2 26  08/25/2017 0850   CO2 28 03/30/2012 1120   GLUCOSE 110 (H) 08/25/2017 0850   GLUCOSE 119 (H) 03/30/2012 1120   BUN 16 08/25/2017 0850   BUN 16 03/30/2012 1120   CREATININE 1.05 (H) 08/25/2017 0850   CREATININE 1.13 03/30/2012 1120   CALCIUM 8.9 08/25/2017 0850   CALCIUM 9.4 03/30/2012 1120   PROT 7.1 08/25/2017 0850   PROT 7.2 03/30/2012 1120   ALBUMIN 3.9 08/25/2017 0850   ALBUMIN 3.9 03/30/2012 1120   AST 31 08/25/2017 0850   AST 28 03/30/2012 1120   ALT 29 08/25/2017 0850   ALT 47 03/30/2012 1120   ALKPHOS 67 08/25/2017 0850   ALKPHOS 105 03/30/2012 1120   BILITOT 0.8 08/25/2017 0850   BILITOT 0.4 03/30/2012 1120   GFRNONAA 57 (L) 08/25/2017 0850   GFRNONAA 55 (L) 03/30/2012 1120   GFRAA >60 08/25/2017 0850   GFRAA >60 03/30/2012 1120    No results found for: SPEP, UPEP  Lab Results  Component Value Date   WBC 5.3 08/25/2017   NEUTROABS 3.3 08/25/2017   HGB 12.8 08/25/2017   HCT 37.2 08/25/2017   MCV 90.8 08/25/2017   PLT 205 08/25/2017      Chemistry      Component Value Date/Time   NA 138 08/25/2017 0850   NA 142 03/30/2012 1120   K 3.5 08/25/2017 0850   K 3.9 03/30/2012 1120   CL 105 08/25/2017 0850   CL 106 03/30/2012 1120   CO2 26 08/25/2017 0850   CO2 28 03/30/2012 1120   BUN 16 08/25/2017 0850   BUN 16 03/30/2012 1120   CREATININE 1.05 (H) 08/25/2017 0850   CREATININE 1.13 03/30/2012 1120      Component Value Date/Time   CALCIUM 8.9 08/25/2017 0850   CALCIUM 9.4 03/30/2012 1120   ALKPHOS 67 08/25/2017 0850   ALKPHOS 105 03/30/2012 1120   AST 31 08/25/2017 0850   AST 28 03/30/2012 1120   ALT 29 08/25/2017 0850   ALT 47 03/30/2012 1120   BILITOT 0.8 08/25/2017 0850   BILITOT 0.4 03/30/2012 1120        ASSESSMENT & PLAN:   Idiopathic thrombocytopenic purpura (HCC) Chronic refractory ITP status post multiple lines of therapy; steroid responsive but intolerant. Patient on Promacta since October 2016.   # Patient is currently on 50 mg  every other day; platelet count today is 205 Lfts-Normal.  Given the good response to Promacta- I would continue current dose. Patient tolerating it well. Pt anticipating change of insurance/change of job; concerned re: Environmental education officerpromacta; will contact re: assistance program.    # follow up in 6 months-labs.   Cc; Dr.Theis.      No orders of the defined types were placed in this encounter.  Earna Coder, MD 09/01/2017 1:49 PM

## 2017-08-25 NOTE — Assessment & Plan Note (Addendum)
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 Patient on Promacta since October 2016.   # Patient is currently on 50 mg every other day; platelet count today is 205 Lfts-Normal.  Given the good response to Promacta- I would continue current dose. Patient tolerating it well. Pt anticipating change of insurance/change of job; concerned re: Environmental education officerpromacta; will contact re: assistance program.    # follow up in 6 months-labs.   Cc; Dr.Theis.

## 2017-09-03 ENCOUNTER — Other Ambulatory Visit: Payer: Self-pay | Admitting: Family Medicine

## 2017-09-23 ENCOUNTER — Telehealth: Payer: Self-pay | Admitting: Internal Medicine

## 2017-09-23 NOTE — Telephone Encounter (Signed)
[name] Encounter  Patient loosing her job this month February. I will be calling her on 10/06/2017 to set her up to come sign application for her Promacta thru the manufacture. She is on my calendar to call her.   [facility]becka [facility] Patient Advocate [phone] 09/23/2017 1:35 PM

## 2017-10-06 ENCOUNTER — Telehealth: Payer: Self-pay | Admitting: Internal Medicine

## 2017-10-06 NOTE — Telephone Encounter (Signed)
[name] Encounter  Called and left patient a message to call me to set up a time to come fill out the application for Promacta.    Rebecka [facility]Mel[facility] Patient Advocate [phone] 10/06/2017 8:56 AM

## 2017-10-09 DIAGNOSIS — N183 Chronic kidney disease, stage 3 unspecified: Secondary | ICD-10-CM | POA: Insufficient documentation

## 2017-10-09 DIAGNOSIS — M170 Bilateral primary osteoarthritis of knee: Secondary | ICD-10-CM | POA: Insufficient documentation

## 2017-10-29 ENCOUNTER — Encounter (INDEPENDENT_AMBULATORY_CARE_PROVIDER_SITE_OTHER): Payer: BLUE CROSS/BLUE SHIELD | Admitting: Vascular Surgery

## 2017-11-11 ENCOUNTER — Telehealth: Payer: Self-pay | Admitting: Internal Medicine

## 2017-11-11 NOTE — Telephone Encounter (Signed)
[name] Encounter  Called patient to check on her to see if she still needed me to send her application off to [employer] for her Promacta. She said she had gone on her husbands insurance and would not need it now.    Rebecka [facility] Patient Advocate [phone] 11/11/2017 12:10 PM

## 2017-11-16 ENCOUNTER — Ambulatory Visit (INDEPENDENT_AMBULATORY_CARE_PROVIDER_SITE_OTHER): Payer: 59 | Admitting: Vascular Surgery

## 2017-11-16 ENCOUNTER — Encounter (INDEPENDENT_AMBULATORY_CARE_PROVIDER_SITE_OTHER): Payer: Self-pay | Admitting: Vascular Surgery

## 2017-11-16 ENCOUNTER — Other Ambulatory Visit: Payer: Self-pay | Admitting: *Deleted

## 2017-11-16 VITALS — BP 130/80 | HR 68 | Resp 17 | Ht 65.0 in | Wt 240.0 lb

## 2017-11-16 DIAGNOSIS — M159 Polyosteoarthritis, unspecified: Secondary | ICD-10-CM

## 2017-11-16 DIAGNOSIS — M150 Primary generalized (osteo)arthritis: Secondary | ICD-10-CM

## 2017-11-16 DIAGNOSIS — E7800 Pure hypercholesterolemia, unspecified: Secondary | ICD-10-CM

## 2017-11-16 DIAGNOSIS — I890 Lymphedema, not elsewhere classified: Secondary | ICD-10-CM

## 2017-11-16 DIAGNOSIS — J45909 Unspecified asthma, uncomplicated: Secondary | ICD-10-CM

## 2017-11-16 DIAGNOSIS — I872 Venous insufficiency (chronic) (peripheral): Secondary | ICD-10-CM | POA: Diagnosis not present

## 2017-11-16 NOTE — Telephone Encounter (Signed)
New insurance card received, printed and taken to [place] in registration to update her chart

## 2017-11-16 NOTE — Telephone Encounter (Signed)
Called regarding change in insurance and mail order pharmacy for her Victoria Morgan and that she will be needing refill of her Promacta as well in the next couple of weeks. She will e-mail me a picture of her insurance card which I will print and take to registration for an update in the system

## 2017-11-17 MED ORDER — ELTROMBOPAG OLAMINE 50 MG PO TABS: 50.0000 mg | ORAL_TABLET | ORAL | 6 refills | Status: DC

## 2017-11-19 ENCOUNTER — Other Ambulatory Visit: Payer: Self-pay | Admitting: *Deleted

## 2017-11-19 NOTE — Telephone Encounter (Signed)
This was already refilled at a different pharmacy per patient request

## 2017-11-21 ENCOUNTER — Encounter (INDEPENDENT_AMBULATORY_CARE_PROVIDER_SITE_OTHER): Payer: Self-pay | Admitting: Vascular Surgery

## 2017-11-21 DIAGNOSIS — M1990 Unspecified osteoarthritis, unspecified site: Secondary | ICD-10-CM | POA: Insufficient documentation

## 2017-11-21 DIAGNOSIS — J45909 Unspecified asthma, uncomplicated: Secondary | ICD-10-CM | POA: Insufficient documentation

## 2017-11-21 DIAGNOSIS — I890 Lymphedema, not elsewhere classified: Secondary | ICD-10-CM | POA: Insufficient documentation

## 2017-11-21 DIAGNOSIS — I872 Venous insufficiency (chronic) (peripheral): Secondary | ICD-10-CM | POA: Insufficient documentation

## 2017-11-21 NOTE — Progress Notes (Signed)
MRN : [id number]  Victoria Morgan is a 62 y.o. (05/11/1956) female who presents with chief complaint of  Chief Complaint  Patient presents with  . New Patient (Initial Visit)    LE edema  .  History of Present Illness: Patient is seen for evaluation of leg pain and leg swelling. The patient first noticed the swelling remotely. The swelling is associated with pain and discoloration. The pain and swelling worsens with prolonged dependency and improves with elevation. The pain is unrelated to activity.  The patient notes that in the morning the legs are significantly improved but they steadily worsened throughout the course of the day. The patient also notes a steady worsening of the discoloration in the ankle and shin area.   The patient denies claudication symptoms.  The patient denies symptoms consistent with rest pain.  The patient has and extensive history of DJD and notes that her swelling is always much worse when she is taking her nonsteroidal anti-inflammatory medications.  The patient has no had any past angiography, interventions or vascular surgery.  Elevation makes the leg symptoms better, dependency makes them much worse. There is no history of ulcerations. The patient denies any recent changes in medications.  The patient has not been wearing graduated compression.  The patient denies a history of DVT or PE. There is no prior history of phlebitis. There is no history of primary lymphedema.  Patient notes a significant family history of varicose veins on her mother side.  She is not certain as to whether any lymphedema has been diagnosed in the family members.  No history of malignancies. No history of trauma or groin or pelvic surgery. There is no history of radiation treatment to the groin or pelvis  The patient denies amaurosis fugax or recent TIA symptoms. There are no recent neurological changes noted. The patient denies recent episodes of angina or shortness  of breath  Current Meds  Medication Sig  . acetaminophen (TYLENOL ARTHRITIS PAIN) 650 MG CR tablet Take 650 mg by mouth 2 (two) times daily as needed for pain.  Marland Kitchen. albuterol (PROVENTIL HFA;VENTOLIN HFA) 108 (90 Base) MCG/ACT inhaler Inhale 1-2 puffs into the lungs every 6 (six) hours as needed for wheezing or shortness of breath.  . benzonatate (TESSALON) 100 MG capsule Take 1 capsule (100 mg total) by mouth 3 (three) times daily as needed.  Marland Kitchen. buPROPion (WELLBUTRIN SR) 150 MG 12 hr tablet Take 150 mg by mouth daily.   . Calcium Carbonate-Vitamin D (CALCIUM + D PO) Take 1 tablet by mouth daily.  . Cetirizine HCl 10 MG CAPS Take 1 capsule by mouth as needed. Seasonal allergies  . Melatonin 10 MG CAPS Take 10 mg by mouth at bedtime as needed.   . meloxicam (MOBIC) 15 MG tablet Take 15 mg by mouth daily.  . Multiple Vitamin (MULTIVITAMIN WITH MINERALS) TABS tablet Take 1 tablet by mouth daily.  . Naphazoline HCl (CLEAR EYES OP) Place 1 drop into both eyes daily.  . NON FORMULARY Use as directed 1 scoop in the mouth or throat daily. IT WORKS! (The Greens) pt mixes in water or fruit juice  . Omega-3 1000 MG CAPS Take by mouth.  . [DISCONTINUED] eltrombopag (PROMACTA) 50 MG tablet Take 1 tablet (50 mg total) by mouth every other day. Take on an empty stomach 1 hour before a meal or 2 hours after    Past Medical History:  Diagnosis Date  . Asthma   . Blood dyscrasia  ITP   dx in 1990  . History of arthritis    in hips; now resolved after hip surgery  . 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 HAND SURGERY     right hand  . spleenectomy  1991   x 2  . TOTAL HIP ARTHROPLASTY Right 11/28/2013   Procedure: TOTAL HIP ARTHROPLASTY ANTERIOR APPROACH;  Surgeon: Harvie Junior, MD;  Location: [facility] OR;  Service: Orthopedics;  Laterality: Right;  Right total hip arthroplasty anterior approach  . TOTAL HIP ARTHROPLASTY Left 05/15/2014    Procedure: TOTAL HIP ARTHROPLASTY ANTERIOR APPROACH;  Surgeon: Harvie Junior, MD;  Location: [facility] OR;  Service: Orthopedics;  Laterality: Left;  . TUBAL LIGATION      Social History Social History   Tobacco Use  . Smoking status: Never Smoker  . Smokeless tobacco: Never Used  Substance Use Topics  . Alcohol use: Yes    Alcohol/week: 0.0 oz    Comment: occasionally  . Drug use: No    Family History Family History  Problem Relation Age of Onset  . Lung disease Mother   . Breast cancer Paternal Aunt 80       x 2 (first time late 54s), same breast  . Prostate cancer Paternal Uncle 1  . Bone cancer Paternal Uncle 60  . Brain cancer Paternal Uncle 76  . Lung cancer Paternal Uncle 23  . Asthma Father   No family history of bleeding/clotting disorders, porphyria or autoimmune disease   Allergies  Allergen Reactions  . Nsaids Rash    Contraindicated by ITP, per Dr. Lorre Nick  . Aspirin     History of low platelets  . Citrus     itch      REVIEW OF SYSTEMS (Negative unless checked)  Constitutional: [] Weight loss  [] Fever  [] Chills Cardiac: [] Chest pain   [] Chest pressure   [] Palpitations   [] Shortness of breath when laying flat   [] Shortness of breath with exertion. Vascular:  [] Pain in legs with walking   [x] Pain in legs at rest  [] History of DVT   [] Phlebitis   [x] Swelling in legs   [] Varicose veins   [] Non-healing ulcers Pulmonary:   [] Uses home oxygen   [] Productive cough   [] Hemoptysis   [] Wheeze  [] COPD   [x] Asthma Neurologic:  [] Dizziness   [] Seizures   [] History of stroke   [] History of TIA  [] Aphasia   [] Vissual changes   [] Weakness or numbness in arm   [] Weakness or numbness in leg Musculoskeletal:   [] Joint swelling   [] Joint pain   [] Low back pain Hematologic:  [] Easy bruising  [] Easy bleeding   [] Hypercoagulable state   [] Anemic Gastrointestinal:  [] Diarrhea   [] Vomiting  [] Gastroesophageal reflux/heartburn   [] Difficulty swallowing. Genitourinary:  [] Chronic kidney  disease   [] Difficult urination  [] Frequent urination   [] Blood in urine Skin:  [] Rashes   [] Ulcers  Psychological:  [] History of anxiety   []  History of major depression.  Physical Examination  Vitals:   11/16/17 1429  BP: 130/80  Pulse: 68  Resp: 17  Weight: 240 lb (108.9 kg)  Height: 5\' 5"  (1.651 m)   Body mass index is 39.94 kg/m. Gen: WD/WN, NAD Head: [place]/AT, No temporalis wasting.  Ear/Nose/Throat: Hearing grossly intact, nares w/o erythema or drainage, poor dentition Eyes: PER, EOMI, sclera nonicteric.  Neck: Supple, no masses.  No bruit or JVD.  Pulmonary:  Good air movement, clear to auscultation bilaterally, no  use of accessory muscles.  Cardiac: RRR, normal S1, S2, no Murmurs. Vascular: scattered varicosities present bilaterally.  Mild-moderate venous stasis changes to the legs bilaterally.  3-4+ soft pitting edema Vessel Right Left  Radial Palpable Palpable  PT Palpable Palpable  DP Palpable Palpable  Gastrointestinal: soft, non-distended. No guarding/no peritoneal signs.  Musculoskeletal: M/S 5/5 throughout.  No deformity or atrophy.  Neurologic: CN 2-12 intact. Pain and light touch intact in extremities.  Symmetrical.  Speech is fluent. Motor exam as listed above. Psychiatric: Judgment intact, Mood & affect appropriate for pt's clinical situation. Dermatologic: No rashes or ulcers noted.  No changes consistent with cellulitis. Lymph : No Cervical lymphadenopathy, no lichenification or skin changes of chronic lymphedema.  CBC Lab Results  Component Value Date   WBC 5.3 08/25/2017   HGB 12.8 08/25/2017   HCT 37.2 08/25/2017   MCV 90.8 08/25/2017   PLT 205 08/25/2017    BMET    Component Value Date/Time   NA 138 08/25/2017 0850   NA 142 03/30/2012 1120   K 3.5 08/25/2017 0850   K 3.9 03/30/2012 1120   CL 105 08/25/2017 0850   CL 106 03/30/2012 1120   CO2 26 08/25/2017 0850   CO2 28 03/30/2012 1120   GLUCOSE 110 (H) 08/25/2017 0850   GLUCOSE 119 (H)  03/30/2012 1120   BUN 16 08/25/2017 0850   BUN 16 03/30/2012 1120   CREATININE 1.05 (H) 08/25/2017 0850   CREATININE 1.13 03/30/2012 1120   CALCIUM 8.9 08/25/2017 0850   CALCIUM 9.4 03/30/2012 1120   GFRNONAA 57 (L) 08/25/2017 0850   GFRNONAA 55 (L) 03/30/2012 1120   GFRAA >60 08/25/2017 0850   GFRAA >60 03/30/2012 1120   CrCl cannot be calculated (Patient's most recent lab result is older than the maximum 21 days allowed.).  COAG Lab Results  Component Value Date   INR 1.00 05/02/2014   INR 1.05 11/30/2013   INR 1.15 11/29/2013    Radiology No results found.   Assessment/Plan 1. Lymphedema I have had a long discussion with the patient regarding swelling and why it  causes symptoms.  Patient will begin wearing graduated compression stockings class 1 (20-30 mmHg) on a daily basis a prescription was given. The patient will  beginning wearing the stockings first thing in the morning and removing them in the evening. The patient is instructed specifically not to sleep in the stockings.   Given my initial findings I am also strongly feeling the addition of the lymph pump will be an important long-term treatment  In addition, behavioral modification will be initiated.  This will include frequent elevation, use of over the counter pain medications and exercise such as walking.  I have reviewed systemic causes for chronic edema such as liver, kidney and cardiac etiologies.  The patient denies problems with these organ systems.    Consideration for a lymph pump will also be made based upon the effectiveness of conservative therapy.  This would help to improve the edema control and prevent sequela such as ulcers and infections.   Patient should undergo duplex ultrasound of the venous system to ensure that DVT or reflux is not present.  The patient will follow-up with me after the ultrasound.   - VAS [place] LOWER EXTREMITY VENOUS REFLUX; Future  2. Chronic venous insufficiency No  surgery or intervention at this point in time.    I have had a long discussion with the patient regarding venous insufficiency and why it  causes symptoms. I  have discussed with the patient the chronic skin changes that accompany venous insufficiency and the long term sequela such as infection and ulceration.  Patient will begin wearing graduated compression stockings class 1 (20-30 mmHg) or compression wraps on a daily basis a prescription was given. The patient will put the stockings on first thing in the morning and removing them in the evening. The patient is instructed specifically not to sleep in the stockings.    In addition, behavioral modification including several periods of elevation of the lower extremities during the day will be continued. I have demonstrated that proper elevation is a position with the ankles at heart level.  The patient is instructed to begin routine exercise, especially walking on a daily basis  Patient should undergo duplex ultrasound of the venous system to ensure that DVT or reflux is not present.  Following the review of the ultrasound the patient will follow up in 2-3 months to reassess the degree of swelling and the control that graduated compression stockings or compression wraps  is offering.   The patient can be assessed for a Lymph Pump at that time - VAS [place] LOWER EXTREMITY VENOUS REFLUX; Future  3. Primary osteoarthritis involving multiple joints Continue NSAID medications as already ordered, these medications have been reviewed and there are no changes at this time.  Continued activity and therapy was stressed.   4. Hypercholesterolemia without hypertriglyceridemia Continue statin as ordered and reviewed, no changes at this time   5. Uncomplicated asthma, unspecified asthma severity, unspecified whether persistent Continue pulmonary medications and aerosols as already ordered, these medications have been reviewed and there are no changes at this  time.      Levora Dredge, MD  11/21/2017 11:41 AM

## 2017-12-21 ENCOUNTER — Telehealth: Payer: Self-pay

## 2017-12-21 NOTE — Telephone Encounter (Signed)
Per ABC, pt needs apt for exam. Notified pt and transferred for scheduling.

## 2017-12-21 NOTE — Telephone Encounter (Signed)
Pt requesting referral for mammogram. She has found a lump on her breast. [phone]

## 2017-12-22 ENCOUNTER — Ambulatory Visit (INDEPENDENT_AMBULATORY_CARE_PROVIDER_SITE_OTHER): Payer: 59 | Admitting: Obstetrics and Gynecology

## 2017-12-22 ENCOUNTER — Encounter: Payer: Self-pay | Admitting: Obstetrics and Gynecology

## 2017-12-22 VITALS — BP 140/86 | HR 66 | Ht 65.0 in | Wt 240.0 lb

## 2017-12-22 DIAGNOSIS — Z803 Family history of malignant neoplasm of breast: Secondary | ICD-10-CM | POA: Diagnosis not present

## 2017-12-22 DIAGNOSIS — N6320 Unspecified lump in the left breast, unspecified quadrant: Secondary | ICD-10-CM | POA: Diagnosis not present

## 2017-12-22 NOTE — Patient Instructions (Signed)
I value your feedback and entrusting us with your care. If you get a [facility] patient survey, I would appreciate you taking the time to let us know about your experience today. Thank you! 

## 2017-12-22 NOTE — Progress Notes (Signed)
Mickey Farber, MD   Chief Complaint  Patient presents with  . Breast Problem    hit breast while falling - knot that is getting bigger; sister c breast cancer    HPI:      Ms. Victoria Morgan is a 62 y.o. [id number] who LMP was No LMP recorded. Patient is postmenopausal., presents today for enlarging breast mass LT breast. Pt fell and hit breast 12/05/17. Had a small mass that is increasing in size and has "moved" more lateral to injury site. Area is tender. No erythema, fevers. PGM with breast cancer in 84s, recurrence same breast 70s. Pt's last mammo 08/12/17 was neg. Hx of ITP but platelets normal 1/19. No easy bruising currently.   Past Medical History:  Diagnosis Date  . Asthma   . Blood dyscrasia    ITP   dx in 1990  . History of arthritis    in hips; now resolved after hip surgery  . 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 HAND SURGERY     right hand  . spleenectomy  1991   x 2  . TOTAL HIP ARTHROPLASTY Right 11/28/2013   Procedure: TOTAL HIP ARTHROPLASTY ANTERIOR APPROACH;  Surgeon: Harvie Junior, MD;  Location: [facility] OR;  Service: Orthopedics;  Laterality: Right;  Right total hip arthroplasty anterior approach  . TOTAL HIP ARTHROPLASTY Left 05/15/2014   Procedure: TOTAL HIP ARTHROPLASTY ANTERIOR APPROACH;  Surgeon: Harvie Junior, MD;  Location: [facility] OR;  Service: Orthopedics;  Laterality: Left;  . TUBAL LIGATION      Family History  Problem Relation Age of Onset  . Lung disease Mother   . Breast cancer Paternal Aunt 80       x 2 (first time late 1s), same breast  . Prostate cancer Paternal Uncle 19  . Bone cancer Paternal Uncle 60  . Brain cancer Paternal Uncle 14  . Lung cancer Paternal Uncle 57  . Asthma Father     Social History   Socioeconomic History  . Marital status: Married    Spouse name: Not on file  . Number of children: Not on file  . Years of education: Not on file  .  Highest education level: Not on file  Occupational History  . Not on file  Social Needs  . Financial resource strain: Not on file  . Food insecurity:    Worry: Not on file    Inability: Not on file  . Transportation needs:    Medical: Not on file    Non-medical: Not on file  Tobacco Use  . Smoking status: Never Smoker  . Smokeless tobacco: Never Used  Substance and Sexual Activity  . Alcohol use: Yes    Alcohol/week: 0.0 oz    Comment: occasionally  . Drug use: No  . Sexual activity: Yes    Partners: Male    Birth control/protection: Post-menopausal  Lifestyle  . Physical activity:    Days per week: Not on file    Minutes per session: Not on file  . Stress: Not on file  Relationships  . Social connections:    Talks on phone: Not on file    Gets together: Not on file    Attends religious service: Not on file    Active member of club or organization: Not on file    Attends meetings of clubs or organizations: Not on file    Relationship status: Not on file  .  Intimate partner violence:    Fear of current or ex partner: Not on file    Emotionally abused: Not on file    Physically abused: Not on file    Forced sexual activity: Not on file  Other Topics Concern  . Not on file  Social History Narrative  . Not on file    Outpatient Medications Prior to Visit  Medication Sig Dispense Refill  . acetaminophen (TYLENOL ARTHRITIS PAIN) 650 MG CR tablet Take 650 mg by mouth 2 (two) times daily as needed for pain.    Marland Kitchen albuterol (PROVENTIL HFA;VENTOLIN HFA) 108 (90 Base) MCG/ACT inhaler Inhale 1-2 puffs into the lungs every 6 (six) hours as needed for wheezing or shortness of breath. 1 Inhaler 0  . buPROPion (WELLBUTRIN SR) 150 MG 12 hr tablet Take 150 mg by mouth daily.     . Calcium Carbonate-Vitamin D (CALCIUM + D PO) Take 1 tablet by mouth daily.    . Cetirizine HCl 10 MG CAPS Take 1 capsule by mouth as needed. Seasonal allergies    . eltrombopag (PROMACTA) 50 MG tablet Take  1 tablet (50 mg total) by mouth every other day. Take on an empty stomach 1 hour before a meal or 2 hours after 45 tablet 6  . Melatonin 10 MG CAPS Take 10 mg by mouth at bedtime as needed.     . meloxicam (MOBIC) 15 MG tablet Take 15 mg by mouth daily.    . Multiple Vitamin (MULTIVITAMIN WITH MINERALS) TABS tablet Take 1 tablet by mouth daily.    . Naphazoline HCl (CLEAR EYES OP) Place 1 drop into both eyes daily.    . NON FORMULARY Use as directed 1 scoop in the mouth or throat daily. IT WORKS! (The Greens) pt mixes in water or fruit juice    . Omega-3 1000 MG CAPS Take by mouth.    . benzonatate (TESSALON) 100 MG capsule Take 1 capsule (100 mg total) by mouth 3 (three) times daily as needed. (Patient not taking: Reported on 12/22/2017) 30 capsule 0  . furosemide (LASIX) 40 MG tablet Take 40 mg by mouth daily.      No facility-administered medications prior to visit.      ROS:  Review of Systems  Constitutional: Negative for fever.  Gastrointestinal: Negative for blood in stool, constipation, diarrhea, nausea and vomiting.  Genitourinary: Negative for dyspareunia, dysuria, flank pain, frequency, hematuria, urgency, vaginal bleeding, vaginal discharge and vaginal pain.  Musculoskeletal: Negative for back pain.  Skin: Negative for rash.   BREAST: positive for mass   OBJECTIVE:   Vitals:  BP 140/86   Pulse 66   Ht  (1.651 m)   Wt 240 lb (108.9 kg)   BMI 39.94 kg/m   Physical Exam  Constitutional: She is oriented to person, place, and time. She appears well-developed.  Neck: Normal range of motion.  Pulmonary/Chest: Effort normal. Right breast exhibits no inverted nipple, no mass, no nipple discharge, no skin change and no tenderness. Left breast exhibits mass, skin change and tenderness. Left breast exhibits no inverted nipple and no nipple discharge. Breasts are symmetrical.  LT BREAST 1:00-4:00 POS WITH ~7 X 4 CM FIRM AREA, MILD SKIN CHANGES; NO ERYTHEMA; MILD TENDERNESS      Lymphadenopathy:    She has no axillary adenopathy.  Neurological: She is alert and oriented to person, place, and time.  Psychiatric: Judgment normal.  Vitals reviewed.   Assessment/Plan: Breast mass, left - 1:00-4:00 POS NEAR NIPPLE. Sx  started after trauma. Question hematoma vs mass. Mass is lateral to impact site. Hx of ITP. Check dx mammo and u/s.  - Plan: US BREAST LTD UNI LEFT INC AXILLA, MM DIAG BREAST TOMO UNI LEFT  Family history of breast cancer - PGM with recurrence. Genetic testing not done.    Return if symptoms worsen or fail to improve.  Alicia B. Copland, PA-C 12/22/2017 10:51 AM

## 2017-12-31 ENCOUNTER — Ambulatory Visit
Admission: RE | Admit: 2017-12-31 | Discharge: 2017-12-31 | Disposition: A | Discharge status: Home / Self Care | Payer: 59 | Source: Ambulatory Visit | Attending: Obstetrics and Gynecology | Admitting: Obstetrics and Gynecology

## 2017-12-31 DIAGNOSIS — N6320 Unspecified lump in the left breast, unspecified quadrant: Secondary | ICD-10-CM | POA: Insufficient documentation

## 2017-12-31 NOTE — Addendum Note (Signed)
Addended by: Althea Grimmer B on: 12/31/2017 05:19 PM   Modules accepted: Orders

## 2018-02-15 ENCOUNTER — Ambulatory Visit (INDEPENDENT_AMBULATORY_CARE_PROVIDER_SITE_OTHER): Payer: Self-pay | Admitting: Vascular Surgery

## 2018-02-15 ENCOUNTER — Encounter (INDEPENDENT_AMBULATORY_CARE_PROVIDER_SITE_OTHER): Payer: 59

## 2018-02-15 ENCOUNTER — Ambulatory Visit (INDEPENDENT_AMBULATORY_CARE_PROVIDER_SITE_OTHER): Payer: BLUE CROSS/BLUE SHIELD | Admitting: Vascular Surgery

## 2018-02-15 ENCOUNTER — Encounter (INDEPENDENT_AMBULATORY_CARE_PROVIDER_SITE_OTHER): Payer: BLUE CROSS/BLUE SHIELD

## 2018-02-22 ENCOUNTER — Other Ambulatory Visit: Payer: Self-pay | Admitting: *Deleted

## 2018-02-22 DIAGNOSIS — D693 Immune thrombocytopenic purpura: Secondary | ICD-10-CM

## 2018-02-23 ENCOUNTER — Inpatient Hospital Stay: Payer: 59 | Attending: Internal Medicine | Admitting: Internal Medicine

## 2018-02-23 ENCOUNTER — Encounter: Payer: Self-pay | Admitting: Internal Medicine

## 2018-02-23 ENCOUNTER — Inpatient Hospital Stay: Payer: 59

## 2018-02-23 VITALS — BP 106/72 | HR 66 | Temp 92.0°F | Resp 16 | Wt 235.2 lb

## 2018-02-23 DIAGNOSIS — Z79899 Other long term (current) drug therapy: Secondary | ICD-10-CM | POA: Diagnosis not present

## 2018-02-23 DIAGNOSIS — D693 Immune thrombocytopenic purpura: Secondary | ICD-10-CM | POA: Diagnosis present

## 2018-02-23 LAB — CBC WITH DIFFERENTIAL/PLATELET
BASOS ABS: 0.1 10*3/uL (ref 0–0.1)
Basophils Relative: 1 %
EOS ABS: 0.3 10*3/uL (ref 0–0.7)
EOS PCT: 4 %
HCT: 38.2 % (ref 35.0–47.0)
Hemoglobin: 12.9 g/dL (ref 12.0–16.0)
Lymphocytes Relative: 28 %
Lymphs Abs: 2.3 10*3/uL (ref 1.0–3.6)
MCH: 31 pg (ref 26.0–34.0)
MCHC: 33.8 g/dL (ref 32.0–36.0)
MCV: 91.7 fL (ref 80.0–100.0)
Monocytes Absolute: 0.4 10*3/uL (ref 0.2–0.9)
Monocytes Relative: 6 %
NEUTROS PCT: 61 %
Neutro Abs: 5 10*3/uL (ref 1.4–6.5)
PLATELETS: 421 10*3/uL (ref 150–440)
RBC: 4.16 MIL/uL (ref 3.80–5.20)
RDW: 13.8 % (ref 11.5–14.5)
WBC: 8.2 10*3/uL (ref 3.6–11.0)

## 2018-02-23 LAB — COMPREHENSIVE METABOLIC PANEL
ALBUMIN: 4 g/dL (ref 3.5–5.0)
ALK PHOS: 63 U/L (ref 38–126)
ALT: 31 U/L (ref 0–44)
AST: 27 U/L (ref 15–41)
Anion gap: 8 (ref 5–15)
BUN: 18 mg/dL (ref 6–20)
CHLORIDE: 107 mmol/L (ref 98–111)
CO2: 25 mmol/L (ref 22–32)
CREATININE: 1.27 mg/dL — AB (ref 0.44–1.00)
Calcium: 9 mg/dL (ref 8.9–10.3)
GFR calc Af Amer: 52 mL/min — ABNORMAL LOW (ref >60)
GFR calc non Af Amer: 45 mL/min — ABNORMAL LOW (ref >60)
GLUCOSE: 83 mg/dL (ref 70–99)
Potassium: 3.8 mmol/L (ref 3.5–5.1)
SODIUM: 140 mmol/L (ref 135–145)
Total Bilirubin: 0.3 mg/dL (ref 0.3–1.2)
Total Protein: 7.1 g/dL (ref 6.5–8.1)

## 2018-02-23 NOTE — Assessment & Plan Note (Addendum)
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 Patient on Promacta since October 2016.  #Patient is currently on 50 mg every other day; platelet count today is 470; with excellent clinical response.  However patient tolerating this poorly [see discussion below].  Hold Promacta for now.  Continue checking CBC every 3 weeks.  Discussed that I would not recommend starting Promacta unless the platelets go down to 30-40,000, she is clinically symptomatic.  # "Multiple joints pain/back spasms"-question Promacta versus others.  Review of literature reveals about 5% musculoskeletal symptoms from Promacta.  Dr.Graves [[place], Orthopedics]; avoid Mobic [not taking]   # check cbc every 3 weeks/ follow up in 3 months.   # 25 minutes face-to-face with the patient discussing the above plan of care; more than 50% of time spent on prognosis/ natural history; counseling and coordination.   Cc; Dr.Theis.

## 2018-02-23 NOTE — Progress Notes (Signed)
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   Patient denies any nosebleeds or gum bleeds.  Denies any petechial rash.   Patient however complains of worsening body pains joint pains-noted to have significant worsening since starting on Promacta.  She has been involved by orthopedics; needed injections.  She is concerned about Val Riles is causing her musculoskeletal symptoms.  REVIEW OF SYSTEMS:  A complete 10 point review of system is done with his negative except mentioned above in history of present illness.   I have reviewed the past medical history, past surgical history, social history and family history with the patient and they are unchanged from previous note unless stated above.  ALLERGIES:  is allergic to nsaids; aspirin; and citrus.  MEDICATIONS:  Current Outpatient Medications  Medication Sig Dispense Refill  . acetaminophen (TYLENOL ARTHRITIS PAIN) 650 MG CR tablet Take 650 mg by mouth 2 (two) times daily as needed for pain.    Marland Kitchen albuterol (PROVENTIL HFA;VENTOLIN HFA) 108 (90 Base) MCG/ACT inhaler Inhale 1-2 puffs into the lungs every 6 (six) hours as needed for wheezing or shortness of breath. 1 Inhaler 0  . buPROPion (WELLBUTRIN SR) 150 MG 12 hr tablet Take 150 mg by mouth daily.     . Calcium Carbonate-Vitamin D (CALCIUM + D PO) Take 1 tablet by mouth daily.    . Cetirizine HCl 10 MG CAPS Take 1 capsule by mouth as  needed. Seasonal allergies    . eltrombopag (PROMACTA) 50 MG tablet Take 1 tablet (50 mg total) by mouth every other day. Take on an empty stomach 1 hour before a meal or 2 hours after 45 tablet 6  . glucosamine-chondroitin 500-400 MG tablet Take 1 tablet by mouth 3 (three) times daily.    . Melatonin 10 MG CAPS Take 10 mg by mouth at bedtime as needed.     . meloxicam (MOBIC) 15 MG tablet Take 15 mg by mouth daily.    . Multiple Vitamin (MULTIVITAMIN WITH MINERALS) TABS tablet Take 1 tablet by mouth daily.    . Naphazoline HCl (CLEAR EYES OP) Place 1 drop into both eyes daily.    . NON FORMULARY Use as directed 1 scoop in the mouth or throat daily. IT WORKS! (The Greens) pt mixes in water or fruit juice    . Omega-3 1000 MG CAPS Take by mouth.    . furosemide (LASIX) 40 MG tablet Take 40 mg by mouth daily.      No current facility-administered medications for this visit.     PHYSICAL EXAMINATION:   BP 106/72 (BP Location: Left Arm, Patient Position: Sitting)   Pulse 66   Temp (!) 92 F (33.3 C) (Tympanic)   Resp 16   Wt 235 lb 3.7 oz (106.7 kg)   BMI 39.14 kg/m   Filed Weights   02/23/18 1058  Weight: 235 lb 3.7 oz (106.7 kg)    GENERAL: Well-nourished well-developed; Alert, no distress and comfortable.  She is  alone.Marland Kitchen.  EYES: no pallor or icterus OROPHARYNX: no thrush or ulceration; NECK: supple; no lymph nodes felt. LYMPH:  no palpable lymphadenopathy in the axillary or inguinal regions LUNGS: Decreased breath sounds auscultation bilaterally. No wheeze or crackles HEART/CVS: regular rate & rhythm and no murmurs; No lower extremity edema ABDOMEN:abdomen soft, non-tender and normal bowel sounds. No hepatomegaly or splenomegaly.  Musculoskeletal:no cyanosis of digits and no clubbing  PSYCH: alert & oriented x 3 with fluent speech NEURO: no focal motor/sensory deficits SKIN:  no rashes or significant lesions  LABORATORY DATA:  I have reviewed the data as listed     Component Value Date/Time   NA 140 02/23/2018 1048   NA 142 03/30/2012 1120   K 3.8 02/23/2018 1048   K 3.9 03/30/2012 1120   CL 107 02/23/2018 1048   CL 106 03/30/2012 1120   CO2 25 02/23/2018 1048   CO2 28 03/30/2012 1120   GLUCOSE 83 02/23/2018 1048   GLUCOSE 119 (H) 03/30/2012 1120   BUN 18 02/23/2018 1048   BUN 16 03/30/2012 1120   CREATININE 1.27 (H) 02/23/2018 1048   CREATININE 1.13 03/30/2012 1120   CALCIUM 9.0 02/23/2018 1048   CALCIUM 9.4 03/30/2012 1120   PROT 7.1 02/23/2018 1048   PROT 7.2 03/30/2012 1120   ALBUMIN 4.0 02/23/2018 1048   ALBUMIN 3.9 03/30/2012 1120   AST 27 02/23/2018 1048   AST 28 03/30/2012 1120   ALT 31 02/23/2018 1048   ALT 47 03/30/2012 1120   ALKPHOS 63 02/23/2018 1048   ALKPHOS 105 03/30/2012 1120   BILITOT 0.3 02/23/2018 1048   BILITOT 0.4 03/30/2012 1120   GFRNONAA 45 (L) 02/23/2018 1048   GFRNONAA 55 (L) 03/30/2012 1120   GFRAA 52 (L) 02/23/2018 1048   GFRAA >60 03/30/2012 1120    No results found for: SPEP, UPEP  Lab Results  Component Value Date   WBC 8.2 02/23/2018   NEUTROABS 5.0 02/23/2018   HGB 12.9 02/23/2018   HCT 38.2 02/23/2018   MCV 91.7 02/23/2018   PLT 421 02/23/2018      Chemistry      Component Value Date/Time   NA 140 02/23/2018 1048   NA 142 03/30/2012 1120   K 3.8 02/23/2018 1048   K 3.9 03/30/2012 1120   CL 107 02/23/2018 1048   CL 106 03/30/2012 1120   CO2 25 02/23/2018 1048   CO2 28 03/30/2012 1120   BUN 18 02/23/2018 1048   BUN 16 03/30/2012 1120   CREATININE 1.27 (H) 02/23/2018 1048   CREATININE 1.13 03/30/2012 1120      Component Value Date/Time   CALCIUM 9.0 02/23/2018 1048   CALCIUM 9.4 03/30/2012 1120   ALKPHOS 63 02/23/2018 1048   ALKPHOS 105 03/30/2012 1120   AST 27 02/23/2018 1048   AST 28 03/30/2012 1120   ALT 31 02/23/2018 1048   ALT 47 03/30/2012 1120   BILITOT 0.3 02/23/2018 1048   BILITOT 0.4 03/30/2012 1120        ASSESSMENT & PLAN:   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 Patient on Promacta since October 2016.  #Patient is currently on 50 mg every other day; platelet count today is 470; with excellent clinical response.  However patient tolerating this poorly [see discussion below].  Hold Promacta for now.  Continue checking CBC every 3 weeks.  Discussed that I would not recommend starting Promacta unless the platelets go down to 30-40,000,  she is clinically symptomatic.  # "Multiple joints pain/back spasms"-question Promacta versus others.  Review of literature reveals about 5% musculoskeletal symptoms from Promacta.  Dr.Graves [[place], Orthopedics]; avoid Mobic [not taking]   # check cbc every 3 weeks/ follow up in 3 months.   # 25 minutes face-to-face with the patient discussing the above plan of care; more than 50% of time spent on prognosis/ natural history; counseling and coordination.   Cc; Dr.Theis.      Orders Placed This Encounter  Procedures  . CBC with Differential/Platelet    Standing Status:   Standing    Number of Occurrences:   10    Standing Expiration Date:   02/24/2019  . Comprehensive metabolic panel    Standing Status:   Future    Standing Expiration Date:   02/24/2019        Earna Coder, MD 02/23/2018 12:58 PM

## 2018-03-08 ENCOUNTER — Other Ambulatory Visit: Payer: Self-pay | Admitting: *Deleted

## 2018-03-08 DIAGNOSIS — D693 Immune thrombocytopenic purpura: Secondary | ICD-10-CM

## 2018-03-16 ENCOUNTER — Inpatient Hospital Stay: Payer: 59

## 2018-03-16 ENCOUNTER — Other Ambulatory Visit
Admission: RE | Admit: 2018-03-16 | Discharge: 2018-03-16 | Disposition: A | Discharge status: Home / Self Care | Payer: 59 | Source: Ambulatory Visit | Attending: Internal Medicine | Admitting: Internal Medicine

## 2018-03-16 DIAGNOSIS — D693 Immune thrombocytopenic purpura: Secondary | ICD-10-CM | POA: Diagnosis present

## 2018-03-16 LAB — CBC WITH DIFFERENTIAL/PLATELET
Basophils Absolute: 0.1 10*3/uL (ref 0–0.1)
Basophils Relative: 1 %
EOS PCT: 6 %
Eosinophils Absolute: 0.3 10*3/uL (ref 0–0.7)
HCT: 38.1 % (ref 35.0–47.0)
Hemoglobin: 13 g/dL (ref 12.0–16.0)
Lymphocytes Relative: 30 %
Lymphs Abs: 1.7 10*3/uL (ref 1.0–3.6)
MCH: 31.7 pg (ref 26.0–34.0)
MCHC: 34.2 g/dL (ref 32.0–36.0)
MCV: 92.7 fL (ref 80.0–100.0)
Monocytes Absolute: 0.5 10*3/uL (ref 0.2–0.9)
Monocytes Relative: 8 %
Neutro Abs: 3.1 10*3/uL (ref 1.4–6.5)
Neutrophils Relative %: 55 %
PLATELETS: 52 10*3/uL — AB (ref 150–440)
RBC: 4.12 MIL/uL (ref 3.80–5.20)
RDW: 14 % (ref 11.5–14.5)
WBC: 5.6 10*3/uL (ref 3.6–11.0)

## 2018-03-16 LAB — COMPREHENSIVE METABOLIC PANEL
ALT: 36 U/L (ref 0–44)
AST: 34 U/L (ref 15–41)
Albumin: 3.9 g/dL (ref 3.5–5.0)
Alkaline Phosphatase: 69 U/L (ref 38–126)
Anion gap: 4 — ABNORMAL LOW (ref 5–15)
BUN: 18 mg/dL (ref 6–20)
CO2: 29 mmol/L (ref 22–32)
CREATININE: 1.37 mg/dL — AB (ref 0.44–1.00)
Calcium: 9.5 mg/dL (ref 8.9–10.3)
Chloride: 105 mmol/L (ref 98–111)
GFR calc non Af Amer: 41 mL/min — ABNORMAL LOW (ref >60)
GFR, EST AFRICAN AMERICAN: 48 mL/min — AB (ref >60)
Glucose, Bld: 108 mg/dL — ABNORMAL HIGH (ref 70–99)
Potassium: 4 mmol/L (ref 3.5–5.1)
Sodium: 138 mmol/L (ref 135–145)
Total Bilirubin: 0.6 mg/dL (ref 0.3–1.2)
Total Protein: 6.9 g/dL (ref 6.5–8.1)

## 2018-03-17 ENCOUNTER — Telehealth: Payer: Self-pay | Admitting: *Deleted

## 2018-03-17 DIAGNOSIS — D693 Immune thrombocytopenic purpura: Secondary | ICD-10-CM

## 2018-03-17 NOTE — Telephone Encounter (Signed)
Patient called asking for her platelet count, it has dropped from 421 to 52, Any new orders?

## 2018-03-17 NOTE — Telephone Encounter (Signed)
Steward DroneBrenda, Can we confirm whether she is taking her Promacta or whether she has missed any dosages

## 2018-03-17 NOTE — Telephone Encounter (Signed)
Patient returned call and states she quit taking the Promacta after her last appointment. She states to call her and let her know if she needs to restart it, she has some left. She requests you leave a message as she is unable to answer her phone

## 2018-03-17 NOTE — Telephone Encounter (Signed)
I called and left message for patient to return my call as to whether she has missed any doses of her Promacta

## 2018-03-18 NOTE — Telephone Encounter (Signed)
I called patient and had to leave voice mail to call me back and that doctor wants her to restart Promacta and come in in 2 weeks for lab check.  Appointment schedule for 04/02/18 @8  AM

## 2018-03-18 NOTE — Telephone Encounter (Signed)
Per VO Dr Donneta RombergBrahmanday, patient to restart Promacta and recheck CB in 2 weeks

## 2018-03-18 NOTE — Telephone Encounter (Signed)
Patient called back and said she has restarted her medicine and that she can come in 8/27 for lab

## 2018-04-02 ENCOUNTER — Other Ambulatory Visit: Payer: 59

## 2018-04-05 ENCOUNTER — Other Ambulatory Visit: Payer: 59

## 2018-04-06 ENCOUNTER — Inpatient Hospital Stay: Payer: 59

## 2018-04-06 ENCOUNTER — Inpatient Hospital Stay: Payer: 59 | Attending: Internal Medicine

## 2018-04-06 DIAGNOSIS — Z79899 Other long term (current) drug therapy: Secondary | ICD-10-CM | POA: Diagnosis not present

## 2018-04-06 DIAGNOSIS — D693 Immune thrombocytopenic purpura: Secondary | ICD-10-CM | POA: Diagnosis present

## 2018-04-06 LAB — CBC WITH DIFFERENTIAL/PLATELET
Basophils Absolute: 0.1 10*3/uL (ref 0–0.1)
Basophils Relative: 1 %
EOS ABS: 0.2 10*3/uL (ref 0–0.7)
Eosinophils Relative: 3 %
HCT: 39.3 % (ref 35.0–47.0)
HEMOGLOBIN: 13.1 g/dL (ref 12.0–16.0)
LYMPHS ABS: 1.6 10*3/uL (ref 1.0–3.6)
Lymphocytes Relative: 28 %
MCH: 30.9 pg (ref 26.0–34.0)
MCHC: 33.4 g/dL (ref 32.0–36.0)
MCV: 92.4 fL (ref 80.0–100.0)
Monocytes Absolute: 0.4 10*3/uL (ref 0.2–0.9)
Monocytes Relative: 6 %
NEUTROS PCT: 62 %
Neutro Abs: 3.7 10*3/uL (ref 1.4–6.5)
Platelets: 146 10*3/uL — ABNORMAL LOW (ref 150–440)
RBC: 4.26 MIL/uL (ref 3.80–5.20)
RDW: 13.5 % (ref 11.5–14.5)
WBC: 5.9 10*3/uL (ref 3.6–11.0)

## 2018-04-09 ENCOUNTER — Other Ambulatory Visit: Payer: 59

## 2018-04-23 ENCOUNTER — Ambulatory Visit
Admission: RE | Admit: 2018-04-23 | Discharge: 2018-04-23 | Disposition: A | Discharge status: Home / Self Care | Payer: 59 | Source: Ambulatory Visit | Attending: Obstetrics and Gynecology | Admitting: Obstetrics and Gynecology

## 2018-04-23 DIAGNOSIS — N6320 Unspecified lump in the left breast, unspecified quadrant: Secondary | ICD-10-CM | POA: Diagnosis not present

## 2018-04-27 ENCOUNTER — Inpatient Hospital Stay: Payer: 59 | Attending: Internal Medicine

## 2018-04-27 ENCOUNTER — Telehealth: Payer: Self-pay | Admitting: Internal Medicine

## 2018-04-27 ENCOUNTER — Inpatient Hospital Stay: Payer: 59

## 2018-04-27 DIAGNOSIS — D693 Immune thrombocytopenic purpura: Secondary | ICD-10-CM | POA: Diagnosis present

## 2018-04-27 LAB — CBC WITH DIFFERENTIAL/PLATELET
BASOS ABS: 0.1 10*3/uL (ref 0–0.1)
Basophils Relative: 2 %
Eosinophils Absolute: 0.2 10*3/uL (ref 0–0.7)
Eosinophils Relative: 3 %
HEMATOCRIT: 37.5 % (ref 35.0–47.0)
HEMOGLOBIN: 12.8 g/dL (ref 12.0–16.0)
LYMPHS PCT: 25 %
Lymphs Abs: 1.5 10*3/uL (ref 1.0–3.6)
MCH: 31.3 pg (ref 26.0–34.0)
MCHC: 34 g/dL (ref 32.0–36.0)
MCV: 92 fL (ref 80.0–100.0)
MONOS PCT: 9 %
Monocytes Absolute: 0.5 10*3/uL (ref 0.2–0.9)
NEUTROS PCT: 61 %
Neutro Abs: 3.9 10*3/uL (ref 1.4–6.5)
Platelets: 289 10*3/uL (ref 150–440)
RBC: 4.07 MIL/uL (ref 3.80–5.20)
RDW: 13.2 % (ref 11.5–14.5)
WBC: 6.2 10*3/uL (ref 3.6–11.0)

## 2018-04-27 NOTE — Telephone Encounter (Signed)
x

## 2018-05-18 ENCOUNTER — Inpatient Hospital Stay: Payer: 59 | Attending: Internal Medicine | Admitting: Internal Medicine

## 2018-05-18 ENCOUNTER — Inpatient Hospital Stay: Payer: 59

## 2018-05-18 VITALS — BP 157/78 | HR 71 | Temp 97.6°F | Resp 16 | Wt 235.9 lb

## 2018-05-18 DIAGNOSIS — R030 Elevated blood-pressure reading, without diagnosis of hypertension: Secondary | ICD-10-CM | POA: Diagnosis not present

## 2018-05-18 DIAGNOSIS — D693 Immune thrombocytopenic purpura: Secondary | ICD-10-CM | POA: Insufficient documentation

## 2018-05-18 DIAGNOSIS — Z79899 Other long term (current) drug therapy: Secondary | ICD-10-CM | POA: Insufficient documentation

## 2018-05-18 DIAGNOSIS — M2550 Pain in unspecified joint: Secondary | ICD-10-CM | POA: Diagnosis not present

## 2018-05-18 LAB — CBC WITH DIFFERENTIAL/PLATELET
Abs Immature Granulocytes: 0.01 10*3/uL (ref 0.00–0.07)
Basophils Absolute: 0.1 10*3/uL (ref 0.0–0.1)
Basophils Relative: 1 %
Eosinophils Absolute: 0.2 10*3/uL (ref 0.0–0.5)
Eosinophils Relative: 4 %
HEMATOCRIT: 36.1 % (ref 36.0–46.0)
Hemoglobin: 12.3 g/dL (ref 12.0–15.0)
Immature Granulocytes: 0 %
LYMPHS PCT: 27 %
Lymphs Abs: 1.7 10*3/uL (ref 0.7–4.0)
MCH: 30.8 pg (ref 26.0–34.0)
MCHC: 34.1 g/dL (ref 30.0–36.0)
MCV: 90.5 fL (ref 80.0–100.0)
MONO ABS: 0.5 10*3/uL (ref 0.1–1.0)
Monocytes Relative: 8 %
Neutro Abs: 3.8 10*3/uL (ref 1.7–7.7)
Neutrophils Relative %: 60 %
Platelets: 162 10*3/uL (ref 150–400)
RBC: 3.99 MIL/uL (ref 3.87–5.11)
RDW: 14.1 % (ref 11.5–15.5)
WBC: 6.3 10*3/uL (ref 4.0–10.5)
nRBC: 0.3 % — ABNORMAL HIGH (ref 0.0–0.2)

## 2018-05-18 MED ORDER — ERGOCALCIFEROL 1.25 MG (50000 UT) PO CAPS: 50000.0000 [IU] | ORAL_CAPSULE | ORAL | 1 refills | Status: DC

## 2018-05-18 NOTE — Progress Notes (Signed)
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   INTERVAL HISTORY:  This is a very pleasant 62 year-old African-American female patient with a history of chronic refractory ITP currently on Promacta 50 mg every other day is here for follow-up.   Given the worsening joint pains patient was taken off Promacta approximately 3 months ago. Patient continues to have joint pains myalgias-which did not significantly improve or resolve after stopping Promacta.   However, she noted to have her platelets dropped to 50s.  And she was started back on Promacta.  Patient denies any nosebleeds or gum bleeds.  Denies any petechial rash.   Review of Systems  Constitutional: Negative for chills, diaphoresis, fever, malaise/fatigue and weight loss.  HENT: Negative for nosebleeds and sore throat.   Eyes: Negative for double vision.  Respiratory: Negative for cough, hemoptysis, sputum production, shortness of breath and wheezing.   Cardiovascular: Negative for chest pain, palpitations, orthopnea and leg swelling.  Gastrointestinal: Negative for abdominal pain, blood in stool, constipation, diarrhea, heartburn, melena, nausea and vomiting.  Genitourinary: Negative for dysuria, frequency and urgency.  Musculoskeletal: Positive for back pain and joint pain.  Skin: Negative.  Negative for itching and rash.  Neurological: Negative for dizziness, tingling, focal weakness, weakness and headaches.  Endo/Heme/Allergies: Does not bruise/bleed easily.  Psychiatric/Behavioral: Negative for depression.  The patient is not nervous/anxious and does not have insomnia.      I have reviewed the past medical history, past surgical history, social history and family history with the patient and they are unchanged from previous note unless stated above.  ALLERGIES:  is allergic to nsaids; aspirin; and citrus.  MEDICATIONS:  Current Outpatient Medications  Medication Sig Dispense Refill  . acetaminophen (TYLENOL ARTHRITIS PAIN) 650 MG CR tablet Take 650 mg by mouth 2 (two) times daily as needed for pain.    Marland Kitchen albuterol (PROVENTIL HFA;VENTOLIN HFA) 108 (90 Base) MCG/ACT inhaler Inhale 1-2 puffs into the lungs every 6 (six) hours as needed for wheezing or shortness of breath. 1 Inhaler 0  . buPROPion (WELLBUTRIN SR) 150 MG 12 hr tablet Take 150 mg by mouth daily.     . Calcium Carbonate-Vitamin D (CALCIUM + D PO) Take 1 tablet by mouth daily.    . Cetirizine HCl 10 MG CAPS Take 1 capsule by mouth as needed. Seasonal allergies    . eltrombopag (PROMACTA) 50 MG tablet Take 1 tablet (50 mg total) by mouth every other day. Take on an empty stomach 1 hour before a meal or 2 hours after 45 tablet 6  . furosemide (LASIX) 40 MG tablet Take 40 mg by mouth daily.     Marland Kitchen glucosamine-chondroitin 500-400 MG tablet Take 1 tablet by mouth 3 (three) times daily.    . Melatonin 10 MG CAPS Take 10 mg by mouth at bedtime as needed.     . meloxicam (MOBIC) 15 MG tablet Take 15 mg by mouth daily.    . Multiple Vitamin (MULTIVITAMIN WITH MINERALS) TABS tablet Take 1 tablet by mouth daily.    . Naphazoline HCl (CLEAR EYES OP) Place 1 drop  into both eyes daily.    . NON FORMULARY Use as directed 1 scoop in the mouth or throat daily. IT WORKS! ([employer] Greens) pt mixes in water or fruit juice    . Omega-3 1000 MG CAPS Take by mouth.    . ergocalciferol (VITAMIN D2) 50000 units capsule Take 1 capsule (50,000 Units total) by mouth once a week. 12 capsule 1   No current facility-administered medications for this visit.      PHYSICAL EXAMINATION:   BP (!) 157/78 (BP Location: Left Arm, Patient Position: Sitting)   Pulse 71   Temp 97.6 F (36.4 C) (Tympanic)   Resp 16   Wt 235 lb 14.3 oz (107 kg)   BMI 39.25 kg/m   Filed Weights   05/18/18 0904  Weight: 235 lb 14.3 oz (107 kg)    Physical Exam  Constitutional: She is oriented to person, place, and time and well-developed, well-nourished, and in no distress.  HENT:  Head: Normocephalic and atraumatic.  Mouth/Throat: Oropharynx is clear and moist. No oropharyngeal exudate.  Eyes: Pupils are equal, round, and reactive to light.  Neck: Normal range of motion. Neck supple.  Cardiovascular: Normal rate and regular rhythm.  Pulmonary/Chest: No respiratory distress. She has no wheezes.  Abdominal: Soft. Bowel sounds are normal. She exhibits no distension and no mass. There is no tenderness. There is no rebound and no guarding.  Musculoskeletal: Normal range of motion. She exhibits no edema or tenderness.  Neurological: She is alert and oriented to person, place, and time.  Skin: Skin is warm.  Psychiatric: Affect normal.     LABORATORY DATA:  I have reviewed the data as listed    Component Value Date/Time   NA 138 03/16/2018 1805   NA 142 03/30/2012 1120   K 4.0 03/16/2018 1805   K 3.9 03/30/2012 1120   CL 105 03/16/2018 1805   CL 106 03/30/2012 1120   CO2 29 03/16/2018 1805   CO2 28 03/30/2012 1120   GLUCOSE 108 (H) 03/16/2018 1805   GLUCOSE 119 (H) 03/30/2012 1120   BUN 18 03/16/2018 1805   BUN 16 03/30/2012 1120   CREATININE 1.37 (H) 03/16/2018 1805   CREATININE 1.13 03/30/2012 1120   CALCIUM 9.5 03/16/2018 1805   CALCIUM 9.4 03/30/2012 1120   PROT 6.9 03/16/2018 1805   PROT 7.2 03/30/2012 1120   ALBUMIN 3.9 03/16/2018 1805   ALBUMIN 3.9 03/30/2012 1120   AST 34 03/16/2018 1805   AST 28 03/30/2012 1120   ALT 36 03/16/2018 1805   ALT 47 03/30/2012 1120   ALKPHOS 69 03/16/2018 1805   ALKPHOS 105 03/30/2012 1120   BILITOT 0.6  03/16/2018 1805   BILITOT 0.4 03/30/2012 1120   GFRNONAA 41 (L) 03/16/2018 1805   GFRNONAA 55 (L) 03/30/2012 1120   GFRAA 48 (L) 03/16/2018 1805   GFRAA >60 03/30/2012 1120    No results found for: SPEP, UPEP  Lab Results  Component Value Date   WBC 6.3 05/18/2018   NEUTROABS 3.8 05/18/2018   HGB 12.3 05/18/2018   HCT 36.1 05/18/2018   MCV 90.5 05/18/2018   PLT 162 05/18/2018      Chemistry      Component Value Date/Time   NA 138 03/16/2018 1805   NA 142 03/30/2012 1120   K 4.0 03/16/2018 1805   K 3.9 03/30/2012 1120   CL 105 03/16/2018 1805   CL 106 03/30/2012 1120   CO2 29 03/16/2018 1805   CO2 28 03/30/2012 1120  BUN 18 03/16/2018 1805   BUN 16 03/30/2012 1120   CREATININE 1.37 (H) 03/16/2018 1805   CREATININE 1.13 03/30/2012 1120      Component Value Date/Time   CALCIUM 9.5 03/16/2018 1805   CALCIUM 9.4 03/30/2012 1120   ALKPHOS 69 03/16/2018 1805   ALKPHOS 105 03/30/2012 1120   AST 34 03/16/2018 1805   AST 28 03/30/2012 1120   ALT 36 03/16/2018 1805   ALT 47 03/30/2012 1120   BILITOT 0.6 03/16/2018 1805   BILITOT 0.4 03/30/2012 1120        ASSESSMENT & PLAN:   Idiopathic thrombocytopenic purpura (HCC) Chronic refractory ITP status post multiple lines of therapy; steroid responsive but intolerant. on Promacta since October 2016.  #Patient is currently on 50 mg every other day; platelet count today is 289. Given continued joint pain- decrease the dose to 50 twice a week [Monday/thursday]; goal platelets- 80-100.  #  "Multiple joints pain/back spasms"-?Promacta versus others.  Worse.  Unlikely Promacta.  Recommend vit D 50,0000/week.   #Elevated Blood pressure- recommend log of pressures at home; defer to PCP.  DISPOSITION:    # check cbc every 4 weeks #  follow up in 3 months/cbc/cmp- Dr.B  Cc; Dr.Theis.    No orders of the defined types were placed in this encounter.    Earna Coder, MD 05/18/2018 8:08 PM

## 2018-05-18 NOTE — Assessment & Plan Note (Addendum)
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 on Promacta since October 2016.  #Patient is currently on 50 mg every other day; platelet count today is 289. Given continued joint pain- decrease the dose to 50 twice a week [Monday/thursday]; goal platelets- 80-100.  #  "Multiple joints pain/back spasms"-?Promacta versus others.  Worse.  Unlikely Promacta.  Recommend vit D 50,0000/week.   #Elevated Blood pressure- recommend log of pressures at home; defer to PCP.  DISPOSITION:    # check cbc every 4 weeks #  follow up in 3 months/cbc/cmp- Dr.B  Cc; Dr.Theis.

## 2018-05-31 ENCOUNTER — Encounter

## 2018-05-31 ENCOUNTER — Ambulatory Visit (INDEPENDENT_AMBULATORY_CARE_PROVIDER_SITE_OTHER): Payer: Self-pay | Admitting: Vascular Surgery

## 2018-05-31 ENCOUNTER — Encounter (INDEPENDENT_AMBULATORY_CARE_PROVIDER_SITE_OTHER): Payer: 59

## 2018-06-15 ENCOUNTER — Inpatient Hospital Stay: Payer: 59 | Attending: Internal Medicine

## 2018-06-15 DIAGNOSIS — D693 Immune thrombocytopenic purpura: Secondary | ICD-10-CM | POA: Diagnosis present

## 2018-06-15 LAB — CBC WITH DIFFERENTIAL/PLATELET
ABS IMMATURE GRANULOCYTES: 0.01 10*3/uL (ref 0.00–0.07)
BASOS ABS: 0 10*3/uL (ref 0.0–0.1)
Basophils Relative: 1 %
Eosinophils Absolute: 0.2 10*3/uL (ref 0.0–0.5)
Eosinophils Relative: 3 %
HCT: 37 % (ref 36.0–46.0)
Hemoglobin: 12.6 g/dL (ref 12.0–15.0)
IMMATURE GRANULOCYTES: 0 %
Lymphocytes Relative: 24 %
Lymphs Abs: 1.2 10*3/uL (ref 0.7–4.0)
MCH: 30.2 pg (ref 26.0–34.0)
MCHC: 34.1 g/dL (ref 30.0–36.0)
MCV: 88.7 fL (ref 80.0–100.0)
Monocytes Absolute: 0.4 10*3/uL (ref 0.1–1.0)
Monocytes Relative: 8 %
NEUTROS PCT: 64 %
NRBC: 0 % (ref 0.0–0.2)
Neutro Abs: 3.3 10*3/uL (ref 1.7–7.7)
Platelets: 81 10*3/uL — ABNORMAL LOW (ref 150–400)
RBC: 4.17 MIL/uL (ref 3.87–5.11)
RDW: 13.5 % (ref 11.5–15.5)
WBC: 5.1 10*3/uL (ref 4.0–10.5)

## 2018-06-15 LAB — COMPREHENSIVE METABOLIC PANEL
ALT: 28 U/L (ref 0–44)
ANION GAP: 9 (ref 5–15)
AST: 34 U/L (ref 15–41)
Albumin: 4.1 g/dL (ref 3.5–5.0)
Alkaline Phosphatase: 69 U/L (ref 38–126)
BILIRUBIN TOTAL: 0.5 mg/dL (ref 0.3–1.2)
BUN: 14 mg/dL (ref 6–20)
CHLORIDE: 103 mmol/L (ref 98–111)
CO2: 26 mmol/L (ref 22–32)
Calcium: 9.3 mg/dL (ref 8.9–10.3)
Creatinine, Ser: 1.28 mg/dL — ABNORMAL HIGH (ref 0.44–1.00)
GFR calc Af Amer: 52 mL/min — ABNORMAL LOW (ref >60)
GFR, EST NON AFRICAN AMERICAN: 45 mL/min — AB (ref >60)
Glucose, Bld: 89 mg/dL (ref 70–99)
POTASSIUM: 3.6 mmol/L (ref 3.5–5.1)
Sodium: 138 mmol/L (ref 135–145)
TOTAL PROTEIN: 7.3 g/dL (ref 6.5–8.1)

## 2018-06-22 ENCOUNTER — Telehealth: Payer: Self-pay | Admitting: Internal Medicine

## 2018-06-22 NOTE — Telephone Encounter (Signed)
Mychart message sent.

## 2018-07-13 ENCOUNTER — Inpatient Hospital Stay: Payer: 59 | Attending: Internal Medicine

## 2018-07-13 DIAGNOSIS — I10 Essential (primary) hypertension: Secondary | ICD-10-CM | POA: Insufficient documentation

## 2018-07-13 DIAGNOSIS — Z9081 Acquired absence of spleen: Secondary | ICD-10-CM | POA: Insufficient documentation

## 2018-07-13 DIAGNOSIS — D693 Immune thrombocytopenic purpura: Secondary | ICD-10-CM | POA: Diagnosis present

## 2018-07-13 DIAGNOSIS — Z79899 Other long term (current) drug therapy: Secondary | ICD-10-CM | POA: Insufficient documentation

## 2018-07-13 LAB — CBC WITH DIFFERENTIAL/PLATELET
ABS IMMATURE GRANULOCYTES: 0.02 10*3/uL (ref 0.00–0.07)
BASOS PCT: 1 %
Basophils Absolute: 0.1 10*3/uL (ref 0.0–0.1)
Eosinophils Absolute: 0.2 10*3/uL (ref 0.0–0.5)
Eosinophils Relative: 3 %
HCT: 38 % (ref 36.0–46.0)
Hemoglobin: 13 g/dL (ref 12.0–15.0)
IMMATURE GRANULOCYTES: 0 %
LYMPHS ABS: 1.2 10*3/uL (ref 0.7–4.0)
Lymphocytes Relative: 18 %
MCH: 30.5 pg (ref 26.0–34.0)
MCHC: 34.2 g/dL (ref 30.0–36.0)
MCV: 89.2 fL (ref 80.0–100.0)
MONO ABS: 0.5 10*3/uL (ref 0.1–1.0)
MONOS PCT: 8 %
NEUTROS ABS: 4.6 10*3/uL (ref 1.7–7.7)
NEUTROS PCT: 70 %
PLATELETS: 87 10*3/uL — AB (ref 150–400)
RBC: 4.26 MIL/uL (ref 3.87–5.11)
RDW: 13.8 % (ref 11.5–15.5)
WBC: 6.5 10*3/uL (ref 4.0–10.5)
nRBC: 0 % (ref 0.0–0.2)

## 2018-07-15 ENCOUNTER — Telehealth: Payer: Self-pay | Admitting: Internal Medicine

## 2018-07-15 NOTE — Telephone Encounter (Signed)
Mychart message sent.

## 2018-07-22 ENCOUNTER — Other Ambulatory Visit: Payer: Self-pay | Admitting: Obstetrics and Gynecology

## 2018-07-22 DIAGNOSIS — N6320 Unspecified lump in the left breast, unspecified quadrant: Secondary | ICD-10-CM

## 2018-08-05 ENCOUNTER — Other Ambulatory Visit: Payer: Self-pay | Admitting: *Deleted

## 2018-08-05 DIAGNOSIS — D693 Immune thrombocytopenic purpura: Secondary | ICD-10-CM

## 2018-08-09 ENCOUNTER — Encounter: Payer: Self-pay | Admitting: Internal Medicine

## 2018-08-09 ENCOUNTER — Inpatient Hospital Stay (HOSPITAL_BASED_OUTPATIENT_CLINIC_OR_DEPARTMENT_OTHER): Payer: 59 | Admitting: Internal Medicine

## 2018-08-09 ENCOUNTER — Inpatient Hospital Stay: Payer: 59

## 2018-08-09 VITALS — BP 146/76 | HR 71 | Temp 97.6°F | Resp 16 | Wt 236.0 lb

## 2018-08-09 DIAGNOSIS — Z9081 Acquired absence of spleen: Secondary | ICD-10-CM

## 2018-08-09 DIAGNOSIS — D693 Immune thrombocytopenic purpura: Secondary | ICD-10-CM

## 2018-08-09 DIAGNOSIS — Z79899 Other long term (current) drug therapy: Secondary | ICD-10-CM

## 2018-08-09 DIAGNOSIS — I10 Essential (primary) hypertension: Secondary | ICD-10-CM

## 2018-08-09 LAB — COMPREHENSIVE METABOLIC PANEL
ALBUMIN: 3.9 g/dL (ref 3.5–5.0)
ALT: 33 U/L (ref 0–44)
ANION GAP: 6 (ref 5–15)
AST: 31 U/L (ref 15–41)
Alkaline Phosphatase: 75 U/L (ref 38–126)
BILIRUBIN TOTAL: 0.5 mg/dL (ref 0.3–1.2)
BUN: 12 mg/dL (ref 6–20)
CHLORIDE: 107 mmol/L (ref 98–111)
CO2: 30 mmol/L (ref 22–32)
Calcium: 9.5 mg/dL (ref 8.9–10.3)
Creatinine, Ser: 1.03 mg/dL — ABNORMAL HIGH (ref 0.44–1.00)
GFR calc Af Amer: >60 mL/min (ref >60)
GFR calc non Af Amer: 59 mL/min — ABNORMAL LOW (ref >60)
GLUCOSE: 99 mg/dL (ref 70–99)
POTASSIUM: 3.6 mmol/L (ref 3.5–5.1)
SODIUM: 143 mmol/L (ref 135–145)
TOTAL PROTEIN: 7.1 g/dL (ref 6.5–8.1)

## 2018-08-09 LAB — CBC WITH DIFFERENTIAL/PLATELET
ABS IMMATURE GRANULOCYTES: 0.01 10*3/uL (ref 0.00–0.07)
BASOS PCT: 1 %
Basophils Absolute: 0.1 10*3/uL (ref 0.0–0.1)
Eosinophils Absolute: 0.2 10*3/uL (ref 0.0–0.5)
Eosinophils Relative: 4 %
HCT: 35.6 % — ABNORMAL LOW (ref 36.0–46.0)
HEMOGLOBIN: 12.1 g/dL (ref 12.0–15.0)
IMMATURE GRANULOCYTES: 0 %
LYMPHS PCT: 28 %
Lymphs Abs: 1.4 10*3/uL (ref 0.7–4.0)
MCH: 30.1 pg (ref 26.0–34.0)
MCHC: 34 g/dL (ref 30.0–36.0)
MCV: 88.6 fL (ref 80.0–100.0)
MONO ABS: 0.6 10*3/uL (ref 0.1–1.0)
MONOS PCT: 11 %
NEUTROS ABS: 2.9 10*3/uL (ref 1.7–7.7)
NEUTROS PCT: 56 %
PLATELETS: 128 10*3/uL — AB (ref 150–400)
RBC: 4.02 MIL/uL (ref 3.87–5.11)
RDW: 13.6 % (ref 11.5–15.5)
WBC: 5.1 10*3/uL (ref 4.0–10.5)
nRBC: 0 % (ref 0.0–0.2)

## 2018-08-09 NOTE — Assessment & Plan Note (Addendum)
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 on Promacta since October 2016.  #Patient currently on Promacta 50 mg twice a week; platelet count is 128 stable.  Continue at current dose [joint pains]   #  "Multiple joints pain/back spasms"-on  vit D 50,0000/week.  Stable.  Unlikely secondary to Promacta.  #Hypertension-systolic 140s/ recommend log of pressures at home; overall stable  #Leg swelling-venous stasis versus others.  Worse recommend compression stockings leg elevation follow-up with PCP.  DISPOSITION:    # check cbc every 8 weeks #  follow up in 6 months/cbc/cmp- Dr.B Cc; Dr.Theis.

## 2018-08-09 NOTE — Progress Notes (Signed)
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   INTERVAL HISTORY:  This is a very pleasant 62 year-old African-American female patient with a history of chronic refractory ITP currently on Promacta 50 mg twice a week is here for follow-up.   Patient continues to have mild swelling in the legs.  On Lasix as per PCP.  Mild joint pains-currently on high-dose vitamin D.  Overall not any better or worse.  No new lumps or bumps.  No easy bruising or bleeding. Review of Systems  Constitutional: Negative for chills, diaphoresis, fever, malaise/fatigue and weight loss.  HENT: Negative for nosebleeds and sore throat.   Eyes: Negative for double vision.  Respiratory: Negative for cough, hemoptysis, sputum production, shortness of breath and wheezing.   Cardiovascular: Negative for chest pain, palpitations, orthopnea and leg swelling.  Gastrointestinal: Negative for abdominal pain, blood in stool, constipation, diarrhea, heartburn, melena, nausea and vomiting.  Genitourinary: Negative for dysuria, frequency and urgency.  Musculoskeletal: Positive for back pain and joint pain.  Skin: Negative.  Negative for itching and rash.  Neurological: Negative for dizziness, tingling, focal weakness, weakness and headaches.  Endo/Heme/Allergies: Does not bruise/bleed easily.  Psychiatric/Behavioral: Negative for depression. The patient is not nervous/anxious and does not have insomnia.    I have reviewed the past medical history, past surgical history, social history and family history with  the patient and they are unchanged from previous note unless stated above.  ALLERGIES:  is allergic to nsaids; aspirin; and citrus.  MEDICATIONS:  Current Outpatient Medications  Medication Sig Dispense Refill  . acetaminophen (TYLENOL ARTHRITIS PAIN) 650 MG CR tablet Take 650 mg by mouth 2 (two) times daily as needed for pain.    Marland Kitchen albuterol (PROVENTIL HFA;VENTOLIN HFA) 108 (90 Base) MCG/ACT inhaler Inhale 1-2 puffs into the lungs every 6 (six) hours as needed for wheezing or shortness of breath. 1 Inhaler 0  . buPROPion (WELLBUTRIN SR) 150 MG 12 hr tablet Take 150 mg by mouth daily.     . Calcium Carbonate-Vitamin D (CALCIUM + D PO) Take 1 tablet by mouth daily.    . Cetirizine HCl 10 MG CAPS Take 1 capsule by mouth as needed. Seasonal allergies    . ELDERBERRY PO Take by mouth.    . eltrombopag (PROMACTA) 50 MG tablet Take 1 tablet (50 mg total) by mouth every other day. Take on an empty stomach 1 hour before a meal or 2 hours after 45 tablet 6  . ergocalciferol (VITAMIN D2) 50000 units capsule Take 1 capsule (50,000 Units total) by mouth once a week. 12 capsule 1  . glucosamine-chondroitin 500-400 MG tablet Take 1 tablet by mouth 3 (three) times daily.    . Melatonin 10 MG CAPS Take 10 mg by mouth at bedtime as needed.     . meloxicam (MOBIC) 15 MG tablet Take 15 mg by mouth daily.    . Multiple Vitamin (MULTIVITAMIN WITH MINERALS) TABS tablet Take 1 tablet by mouth daily.    . Naphazoline HCl (CLEAR EYES OP) Place 1 drop into both eyes daily.    . NON FORMULARY  Use as directed 1 scoop in the mouth or throat daily. IT WORKS! (The Greens) pt mixes in water or fruit juice    . Omega-3 1000 MG CAPS Take by mouth.    . furosemide (LASIX) 40 MG tablet Take 40 mg by mouth daily.      No current facility-administered medications for this visit.     PHYSICAL EXAMINATION:   BP (!) 146/76 (BP Location: Left Arm, Patient Position: Sitting)   Pulse 71   Temp 97.6 F (36.4 C) (Tympanic)    Resp 16   Wt 236 lb (107 kg)   BMI 39.27 kg/m   Filed Weights   08/09/18 1113  Weight: 236 lb (107 kg)    Physical Exam  Constitutional: She is oriented to person, place, and time and well-developed, well-nourished, and in no distress.  HENT:  Head: Normocephalic and atraumatic.  Mouth/Throat: Oropharynx is clear and moist. No oropharyngeal exudate.  Eyes: Pupils are equal, round, and reactive to light.  Neck: Normal range of motion. Neck supple.  Cardiovascular: Normal rate and regular rhythm.  Pulmonary/Chest: No respiratory distress. She has no wheezes.  Abdominal: Soft. Bowel sounds are normal. She exhibits no distension and no mass. There is no abdominal tenderness. There is no rebound and no guarding.  Musculoskeletal: Normal range of motion.        General: No tenderness or edema.     Comments: Mild swelling in the legs bilaterally.  Neurological: She is alert and oriented to person, place, and time.  Skin: Skin is warm.  Psychiatric: Affect normal.     LABORATORY DATA:  I have reviewed the data as listed    Component Value Date/Time   NA 143 08/09/2018 1029   NA 142 03/30/2012 1120   K 3.6 08/09/2018 1029   K 3.9 03/30/2012 1120   CL 107 08/09/2018 1029   CL 106 03/30/2012 1120   CO2 30 08/09/2018 1029   CO2 28 03/30/2012 1120   GLUCOSE 99 08/09/2018 1029   GLUCOSE 119 (H) 03/30/2012 1120   BUN 12 08/09/2018 1029   BUN 16 03/30/2012 1120   CREATININE 1.03 (H) 08/09/2018 1029   CREATININE 1.13 03/30/2012 1120   CALCIUM 9.5 08/09/2018 1029   CALCIUM 9.4 03/30/2012 1120   PROT 7.1 08/09/2018 1029   PROT 7.2 03/30/2012 1120   ALBUMIN 3.9 08/09/2018 1029   ALBUMIN 3.9 03/30/2012 1120   AST 31 08/09/2018 1029   AST 28 03/30/2012 1120   ALT 33 08/09/2018 1029   ALT 47 03/30/2012 1120   ALKPHOS 75 08/09/2018 1029   ALKPHOS 105 03/30/2012 1120   BILITOT 0.5 08/09/2018 1029   BILITOT 0.4 03/30/2012 1120   GFRNONAA 59 (L) 08/09/2018 1029   GFRNONAA 55 (L)  03/30/2012 1120   GFRAA >60 08/09/2018 1029   GFRAA >60 03/30/2012 1120    No results found for: SPEP, UPEP  Lab Results  Component Value Date   WBC 5.1 08/09/2018   NEUTROABS 2.9 08/09/2018   HGB 12.1 08/09/2018   HCT 35.6 (L) 08/09/2018   MCV 88.6 08/09/2018   PLT 128 (L) 08/09/2018      Chemistry      Component Value Date/Time   NA 143 08/09/2018 1029   NA 142 03/30/2012 1120   K 3.6 08/09/2018 1029   K 3.9 03/30/2012 1120   CL 107 08/09/2018 1029   CL 106 03/30/2012 1120   CO2 30 08/09/2018 1029   CO2 28 03/30/2012 1120   BUN  12 08/09/2018 1029   BUN 16 03/30/2012 1120   CREATININE 1.03 (H) 08/09/2018 1029   CREATININE 1.13 03/30/2012 1120      Component Value Date/Time   CALCIUM 9.5 08/09/2018 1029   CALCIUM 9.4 03/30/2012 1120   ALKPHOS 75 08/09/2018 1029   ALKPHOS 105 03/30/2012 1120   AST 31 08/09/2018 1029   AST 28 03/30/2012 1120   ALT 33 08/09/2018 1029   ALT 47 03/30/2012 1120   BILITOT 0.5 08/09/2018 1029   BILITOT 0.4 03/30/2012 1120        ASSESSMENT & PLAN:   Idiopathic thrombocytopenic purpura (HCC) Chronic refractory ITP status post multiple lines of therapy; steroid responsive but intolerant. on Promacta since October 2016.  #Patient currently on Promacta 50 mg twice a week; platelet count is 128 stable.  Continue at current dose [joint pains]   #  "Multiple joints pain/back spasms"-on  vit D 50,0000/week.  Stable.  Unlikely secondary to Promacta.  #Hypertension-systolic 140s/ recommend log of pressures at home; overall stable  #Leg swelling-venous stasis versus others.  Worse recommend compression stockings leg elevation follow-up with PCP.  DISPOSITION:    # check cbc every 8 weeks #  follow up in 6 months/cbc/cmp- Dr.B Cc; Dr.Theis.    No orders of the defined types were placed in this encounter.    Earna CoderGovinda R Storm Dulski, MD 08/09/2018 1:11 PM

## 2018-08-10 ENCOUNTER — Ambulatory Visit: Payer: 59 | Admitting: Internal Medicine

## 2018-08-10 ENCOUNTER — Other Ambulatory Visit: Payer: 59

## 2018-08-11 DIAGNOSIS — Z803 Family history of malignant neoplasm of breast: Secondary | ICD-10-CM

## 2018-08-11 DIAGNOSIS — Z1371 Encounter for nonprocreative screening for genetic disease carrier status: Secondary | ICD-10-CM

## 2018-08-11 HISTORY — DX: Family history of malignant neoplasm of breast: Z80.3

## 2018-08-11 HISTORY — DX: Encounter for nonprocreative screening for genetic disease carrier status: Z13.71

## 2018-08-12 NOTE — Progress Notes (Signed)
PCP: Mickey Farberhies, David, MD   Chief Complaint  Patient presents with  . Gynecologic Exam    HPI:      Ms. Victoria SettingShelia K Degreaffenreidt is a 63 y.o. No obstetric history on file. who LMP was No LMP recorded. Patient is postmenopausal., presents today for her annual examination.  Her menses are absent and she is postmenopausal. She does not have intermenstrual bleeding.  She does not have vasomotor sx.   Sex activity: single partner, contraception - post menopausal status. She does not have vaginal dryness. Uses lubricants prn.  Last Pap: July 01, 2016  Results were: no abnormalities /neg HPV DNA.  Hx of STDs: none  Last mammogram: 08/12/17 Results were normal; repeat in 12 months. Pt had trauma to LT breast 5/19 and had Cat 3 mammo and then at f/u 9/19. Due for routine mammo 1/20 and pt has appt.   There is a FH of breast cancer in her pat aunt twice and prostate cancer in her pat uncle, genetic testing not done in past due to different guidelines. Pt meets NCCN guidelines now. There is no FH of ovarian cancer. The patient does do self-breast exams.  Colonoscopy: colonoscopy 2/10 without abnormalities. Repeat due after 10 years.   Tobacco use: The patient denies current or previous tobacco use. Alcohol use: social drinker Exercise: moderately active  She does get adequate calcium and Vitamin D in her diet.  Labs with PCP.    Past Medical History:  Diagnosis Date  . Asthma   . Blood dyscrasia    ITP   dx in 1990  . History of arthritis    in hips; now resolved after hip surgery  . 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 COLONOSCOPY  09/18/2008   Dr. Mechele CollinElliott  . HAND SURGERY     right hand  . spleenectomy  1991   x 2  . TOTAL HIP ARTHROPLASTY Right 11/28/2013   Procedure: TOTAL HIP ARTHROPLASTY ANTERIOR APPROACH;  Surgeon: Harvie JuniorJohn L Graves, MD;  Location: [facility] OR;  Service: Orthopedics;  Laterality: Right;  Right  total hip arthroplasty anterior approach  . TOTAL HIP ARTHROPLASTY Left 05/15/2014   Procedure: TOTAL HIP ARTHROPLASTY ANTERIOR APPROACH;  Surgeon: Harvie JuniorJohn L Graves, MD;  Location: [facility] OR;  Service: Orthopedics;  Laterality: Left;  . TUBAL LIGATION      Family History  Problem Relation Age of Onset  . Lung disease Mother   . Breast cancer Paternal Aunt 80       x 2 (first time late 2740s), same breast  . Prostate cancer Paternal Uncle 1960  . Bone cancer Paternal Uncle 60  . Brain cancer Paternal Uncle [id number]  . Lung cancer Paternal Uncle [id number]  . Asthma Father     Social History   Socioeconomic History  . Marital status: Married    Spouse name: Not on file  . Number of children: Not on file  . Years of education: Not on file  . Highest education level: Not on file  Occupational History  . Not on file  Social Needs  . Financial resource strain: Not on file  . Food insecurity:    Worry: Not on file    Inability: Not on file  . Transportation needs:    Medical: Not on file    Non-medical: Not on file  Tobacco Use  . Smoking status: Never Smoker  . Smokeless tobacco: Never Used  Substance and Sexual  Activity  . Alcohol use: Yes    Alcohol/week: 0.0 standard drinks    Comment: occasionally  . Drug use: No  . Sexual activity: Yes    Partners: Male    Birth control/protection: Post-menopausal  Lifestyle  . Physical activity:    Days per week: Not on file    Minutes per session: Not on file  . Stress: Not on file  Relationships  . Social connections:    Talks on phone: Not on file    Gets together: Not on file    Attends religious service: Not on file    Active member of club or organization: Not on file    Attends meetings of clubs or organizations: Not on file    Relationship status: Not on file  . Intimate partner violence:    Fear of current or ex partner: Not on file    Emotionally abused: Not on file    Physically abused: Not on file    Forced sexual activity: Not on  file  Other Topics Concern  . Not on file  Social History Narrative  . Not on file    Current Meds  Medication Sig  . acetaminophen (TYLENOL ARTHRITIS PAIN) 650 MG CR tablet Take 650 mg by mouth 2 (two) times daily as needed for pain.  Marland Kitchen albuterol (PROVENTIL HFA;VENTOLIN HFA) 108 (90 Base) MCG/ACT inhaler Inhale 1-2 puffs into the lungs every 6 (six) hours as needed for wheezing or shortness of breath.  . benzonatate (TESSALON) 100 MG capsule Take by mouth.  Marland Kitchen buPROPion (WELLBUTRIN SR) 150 MG 12 hr tablet Take 150 mg by mouth daily.   . Calcium Carbonate-Vitamin D (CALCIUM + D PO) Take 1 tablet by mouth daily.  . carboxymethylcellulose (REFRESH TEARS) 0.5 % SOLN Apply to eye.  . Cetirizine HCl 10 MG CAPS Take 1 capsule by mouth as needed. Seasonal allergies  . ELDERBERRY PO Take by mouth.  . eltrombopag (PROMACTA) 50 MG tablet Take 1 tablet (50 mg total) by mouth every other day. Take on an empty stomach 1 hour before a meal or 2 hours after  . ergocalciferol (VITAMIN D2) 50000 units capsule Take 1 capsule (50,000 Units total) by mouth once a week.  . Melatonin 10 MG CAPS Take 10 mg by mouth at bedtime as needed.   . Multiple Vitamin (MULTIVITAMIN WITH MINERALS) TABS tablet Take 1 tablet by mouth daily.  . Naphazoline HCl (CLEAR EYES OP) Place 1 drop into both eyes daily.  . NON FORMULARY Use as directed 1 scoop in the mouth or throat daily. IT WORKS! (The Greens) pt mixes in water or fruit juice      ROS:  Review of Systems  Constitutional: Negative for fatigue, fever and unexpected weight change.  Respiratory: Negative for cough, shortness of breath and wheezing.   Cardiovascular: Negative for chest pain, palpitations and leg swelling.  Gastrointestinal: Negative for blood in stool, constipation, diarrhea, nausea and vomiting.  Endocrine: Negative for cold intolerance, heat intolerance and polyuria.  Genitourinary: Negative for dyspareunia, dysuria, flank pain, frequency, genital  sores, hematuria, menstrual problem, pelvic pain, urgency, vaginal bleeding, vaginal discharge and vaginal pain.  Musculoskeletal: Positive for arthralgias. Negative for back pain, joint swelling and myalgias.  Skin: Negative for rash.  Neurological: Negative for dizziness, syncope, light-headedness, numbness and headaches.  Hematological: Negative for adenopathy.  Psychiatric/Behavioral: Negative for agitation, confusion, sleep disturbance and suicidal ideas. The patient is not nervous/anxious.      Objective: BP (!) 150/86  Ht 5\' 5"  (1.651 m)   Wt 240 lb (108.9 kg)   BMI 39.94 kg/m    Physical Exam Constitutional:      Appearance: She is well-developed.  Genitourinary:     Vulva, vagina, cervix, uterus, right adnexa and left adnexa normal.     No vulval lesion or tenderness noted.     No vaginal discharge, erythema or tenderness.     No cervical polyp.     Uterus is not enlarged or tender.     No right or left adnexal mass present.     Right adnexa not tender.     Left adnexa not tender.  Neck:     Musculoskeletal: Normal range of motion.     Thyroid: No thyromegaly.  Cardiovascular:     Rate and Rhythm: Normal rate and regular rhythm.     Heart sounds: Normal heart sounds. No murmur.  Pulmonary:     Effort: Pulmonary effort is normal.     Breath sounds: Normal breath sounds.  Chest:     Breasts:        Right: No mass, nipple discharge, skin change or tenderness.        Left: No mass, nipple discharge, skin change or tenderness.  Abdominal:     Palpations: Abdomen is soft.     Tenderness: There is no abdominal tenderness. There is no guarding.  Musculoskeletal: Normal range of motion.  Neurological:     Mental Status: She is alert and oriented to person, place, and time.     Cranial Nerves: No cranial nerve deficit.  Psychiatric:        Behavior: Behavior normal.  Vitals signs reviewed.     Assessment/Plan:  Encounter for annual routine gynecological  examination  Screening for breast cancer - Pt has mammo sched. LT breast mass due to trauma resolved.   Family history of breast cancer - MyRisk testing discussed and done today. Will call with results.  Screening for colon cancer - Refer to Dr. Mechele CollinElliott for scr colonoscopy due to age - Plan: Ambulatory referral to Gastroenterology        GYN counsel breast self exam, mammography screening, menopause, adequate intake of calcium and vitamin D, diet and exercise    F/U  Return in about 1 year (around 08/14/2019).  Minnie Shi B. Brenlee Koskela, PA-C 08/13/2018 9:24 AM

## 2018-08-13 ENCOUNTER — Encounter: Payer: Self-pay | Admitting: Obstetrics and Gynecology

## 2018-08-13 ENCOUNTER — Ambulatory Visit (INDEPENDENT_AMBULATORY_CARE_PROVIDER_SITE_OTHER): Payer: 59 | Admitting: Obstetrics and Gynecology

## 2018-08-13 VITALS — BP 150/86 | Ht 65.0 in | Wt 240.0 lb

## 2018-08-13 DIAGNOSIS — Z01419 Encounter for gynecological examination (general) (routine) without abnormal findings: Secondary | ICD-10-CM | POA: Diagnosis not present

## 2018-08-13 DIAGNOSIS — Z1211 Encounter for screening for malignant neoplasm of colon: Secondary | ICD-10-CM

## 2018-08-13 DIAGNOSIS — Z1239 Encounter for other screening for malignant neoplasm of breast: Secondary | ICD-10-CM

## 2018-08-13 DIAGNOSIS — Z803 Family history of malignant neoplasm of breast: Secondary | ICD-10-CM

## 2018-08-13 NOTE — Patient Instructions (Signed)
I value your feedback and entrusting us with your care. If you get a [facility] patient survey, I would appreciate you taking the time to let us know about your experience today. Thank you! 

## 2018-08-16 ENCOUNTER — Other Ambulatory Visit: Payer: 59

## 2018-08-20 ENCOUNTER — Ambulatory Visit
Admission: RE | Admit: 2018-08-20 | Discharge: 2018-08-20 | Disposition: A | Discharge status: Home / Self Care | Payer: 59 | Source: Ambulatory Visit | Attending: Obstetrics and Gynecology | Admitting: Obstetrics and Gynecology

## 2018-08-20 DIAGNOSIS — N6320 Unspecified lump in the left breast, unspecified quadrant: Secondary | ICD-10-CM | POA: Diagnosis present

## 2018-08-22 ENCOUNTER — Encounter: Payer: Self-pay | Admitting: Obstetrics and Gynecology

## 2018-08-30 ENCOUNTER — Encounter: Payer: Self-pay | Admitting: Obstetrics and Gynecology

## 2018-09-24 ENCOUNTER — Telehealth: Payer: Self-pay | Admitting: Obstetrics and Gynecology

## 2018-09-24 NOTE — Telephone Encounter (Signed)
Patient is returning missed call to [facility]. Please advise results

## 2018-09-27 NOTE — Telephone Encounter (Signed)
LMTRC

## 2018-09-28 NOTE — Telephone Encounter (Signed)
LMTRC

## 2018-09-28 NOTE — Telephone Encounter (Signed)
Pt aware of neg MyRisk results except 3 VUS. IBIS=19.9%. Pt current on mammo 1/20.   Patient understands these results only apply to her and her children, and this is not indicative of genetic testing results of her other family members. It is recommended that her other family members have genetic testing done.  Pt also understands negative genetic testing doesn't mean she will never get any of these cancers.   Hard copy mailed to pt. F/u prn.

## 2018-10-04 ENCOUNTER — Inpatient Hospital Stay: Payer: 59

## 2018-10-05 ENCOUNTER — Inpatient Hospital Stay: Payer: 59 | Attending: Internal Medicine

## 2018-10-05 DIAGNOSIS — I10 Essential (primary) hypertension: Secondary | ICD-10-CM | POA: Insufficient documentation

## 2018-10-05 DIAGNOSIS — D693 Immune thrombocytopenic purpura: Secondary | ICD-10-CM | POA: Insufficient documentation

## 2018-10-05 DIAGNOSIS — Z79899 Other long term (current) drug therapy: Secondary | ICD-10-CM | POA: Diagnosis not present

## 2018-10-05 DIAGNOSIS — Z9081 Acquired absence of spleen: Secondary | ICD-10-CM | POA: Insufficient documentation

## 2018-10-05 LAB — CBC WITH DIFFERENTIAL/PLATELET
Abs Immature Granulocytes: 0.01 10*3/uL (ref 0.00–0.07)
BASOS ABS: 0.1 10*3/uL (ref 0.0–0.1)
Basophils Relative: 1 %
EOS ABS: 0.2 10*3/uL (ref 0.0–0.5)
Eosinophils Relative: 4 %
HCT: 36.5 % (ref 36.0–46.0)
Hemoglobin: 12.5 g/dL (ref 12.0–15.0)
IMMATURE GRANULOCYTES: 0 %
LYMPHS PCT: 27 %
Lymphs Abs: 1.5 10*3/uL (ref 0.7–4.0)
MCH: 30.2 pg (ref 26.0–34.0)
MCHC: 34.2 g/dL (ref 30.0–36.0)
MCV: 88.2 fL (ref 80.0–100.0)
Monocytes Absolute: 0.6 10*3/uL (ref 0.1–1.0)
Monocytes Relative: 10 %
NEUTROS ABS: 3.3 10*3/uL (ref 1.7–7.7)
Neutrophils Relative %: 58 %
PLATELETS: 243 10*3/uL (ref 150–400)
RBC: 4.14 MIL/uL (ref 3.87–5.11)
RDW: 13.7 % (ref 11.5–15.5)
WBC: 5.6 10*3/uL (ref 4.0–10.5)
nRBC: 0 % (ref 0.0–0.2)

## 2018-10-31 ENCOUNTER — Other Ambulatory Visit: Payer: Self-pay | Admitting: Internal Medicine

## 2018-11-01 ENCOUNTER — Ambulatory Visit: Admission: RE | Admit: 2018-11-01 | Payer: 59 | Source: Home / Self Care | Admitting: Unknown Physician Specialty

## 2018-11-01 ENCOUNTER — Encounter: Admission: RE | Payer: Self-pay | Source: Home / Self Care

## 2018-11-01 SURGERY — COLONOSCOPY WITH PROPOFOL
Anesthesia: General

## 2018-11-01 NOTE — Telephone Encounter (Signed)
I do not find any Vit D levels in chart

## 2018-12-06 ENCOUNTER — Other Ambulatory Visit: Payer: Self-pay

## 2018-12-06 ENCOUNTER — Inpatient Hospital Stay: Payer: 59 | Attending: Internal Medicine

## 2018-12-06 DIAGNOSIS — D693 Immune thrombocytopenic purpura: Secondary | ICD-10-CM | POA: Insufficient documentation

## 2018-12-06 LAB — CBC WITH DIFFERENTIAL/PLATELET
Abs Immature Granulocytes: 0.01 10*3/uL (ref 0.00–0.07)
Basophils Absolute: 0.1 10*3/uL (ref 0.0–0.1)
Basophils Relative: 1 %
Eosinophils Absolute: 0.2 10*3/uL (ref 0.0–0.5)
Eosinophils Relative: 4 %
HCT: 36.5 % (ref 36.0–46.0)
Hemoglobin: 12.5 g/dL (ref 12.0–15.0)
Immature Granulocytes: 0 %
Lymphocytes Relative: 35 %
Lymphs Abs: 1.9 10*3/uL (ref 0.7–4.0)
MCH: 30.2 pg (ref 26.0–34.0)
MCHC: 34.2 g/dL (ref 30.0–36.0)
MCV: 88.2 fL (ref 80.0–100.0)
Monocytes Absolute: 0.6 10*3/uL (ref 0.1–1.0)
Monocytes Relative: 10 %
Neutro Abs: 2.7 10*3/uL (ref 1.7–7.7)
Neutrophils Relative %: 50 %
Platelets: 106 10*3/uL — ABNORMAL LOW (ref 150–400)
RBC: 4.14 MIL/uL (ref 3.87–5.11)
RDW: 14.3 % (ref 11.5–15.5)
WBC: 5.5 10*3/uL (ref 4.0–10.5)
nRBC: 0 % (ref 0.0–0.2)

## 2018-12-07 ENCOUNTER — Telehealth: Payer: Self-pay | Admitting: *Deleted

## 2018-12-07 ENCOUNTER — Encounter: Payer: Self-pay | Admitting: *Deleted

## 2018-12-07 NOTE — Telephone Encounter (Signed)
Patient called asking what platelet count was from yesterdays labs.  Released lab results on mychart for her to review. And sent her a MyChart message.

## 2018-12-13 ENCOUNTER — Other Ambulatory Visit: Payer: Self-pay | Admitting: Internal Medicine

## 2019-02-07 ENCOUNTER — Inpatient Hospital Stay: Payer: 59 | Attending: Internal Medicine

## 2019-02-07 ENCOUNTER — Other Ambulatory Visit: Payer: Self-pay

## 2019-02-07 ENCOUNTER — Encounter: Payer: Self-pay | Admitting: Internal Medicine

## 2019-02-07 ENCOUNTER — Inpatient Hospital Stay (HOSPITAL_BASED_OUTPATIENT_CLINIC_OR_DEPARTMENT_OTHER): Payer: 59 | Admitting: Internal Medicine

## 2019-02-07 DIAGNOSIS — I10 Essential (primary) hypertension: Secondary | ICD-10-CM

## 2019-02-07 DIAGNOSIS — D693 Immune thrombocytopenic purpura: Secondary | ICD-10-CM | POA: Insufficient documentation

## 2019-02-07 DIAGNOSIS — Z79899 Other long term (current) drug therapy: Secondary | ICD-10-CM

## 2019-02-07 LAB — CBC WITH DIFFERENTIAL/PLATELET
Abs Immature Granulocytes: 0.01 10*3/uL (ref 0.00–0.07)
Basophils Absolute: 0 10*3/uL (ref 0.0–0.1)
Basophils Relative: 1 %
Eosinophils Absolute: 0.2 10*3/uL (ref 0.0–0.5)
Eosinophils Relative: 3 %
HCT: 37.6 % (ref 36.0–46.0)
Hemoglobin: 12.9 g/dL (ref 12.0–15.0)
Immature Granulocytes: 0 %
Lymphocytes Relative: 31 %
Lymphs Abs: 1.8 10*3/uL (ref 0.7–4.0)
MCH: 30.3 pg (ref 26.0–34.0)
MCHC: 34.3 g/dL (ref 30.0–36.0)
MCV: 88.3 fL (ref 80.0–100.0)
Monocytes Absolute: 0.5 10*3/uL (ref 0.1–1.0)
Monocytes Relative: 9 %
Neutro Abs: 3.2 10*3/uL (ref 1.7–7.7)
Neutrophils Relative %: 56 %
Platelets: 112 10*3/uL — ABNORMAL LOW (ref 150–400)
RBC: 4.26 MIL/uL (ref 3.87–5.11)
RDW: 13.9 % (ref 11.5–15.5)
WBC: 5.8 10*3/uL (ref 4.0–10.5)
nRBC: 0 % (ref 0.0–0.2)

## 2019-02-07 NOTE — Progress Notes (Signed)
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     HISTORY OF PRESENTING ILLNESS:  Victoria Morgan [age].o.  female  African-American female patient with a history of chronic refractory ITP currently on Promacta 50 mg twice a week is here for follow-up.   Patient continues to have intermittent joint pains.  She currently on 50,000 units of vitamin D on a weekly basis.  Continues to have intermittent swelling the legs.  Not any worse.  No new lumps or bumps.  No easy bruising or bleeding.   Review of Systems  Constitutional: Negative for chills, diaphoresis, fever, malaise/fatigue and weight loss.  HENT: Negative for nosebleeds and sore throat.   Eyes: Negative for double vision.  Respiratory: Negative for cough, hemoptysis, sputum production, shortness of breath and wheezing.   Cardiovascular: Negative for chest pain, palpitations, orthopnea and leg swelling.  Gastrointestinal: Negative for abdominal pain, blood in stool, constipation, diarrhea, heartburn, melena, nausea and vomiting.  Genitourinary: Negative for dysuria, frequency and urgency.  Musculoskeletal: Positive for back pain and joint pain.  Skin: Negative.  Negative for itching and rash.  Neurological: Negative for dizziness, tingling, focal weakness, weakness and headaches.  Endo/Heme/Allergies: Does not bruise/bleed easily.  Psychiatric/Behavioral: Negative for depression. The patient is not nervous/anxious and does not  have insomnia.      MEDICAL HISTORY:  Past Medical History:  Diagnosis Date  . Asthma   . Blood dyscrasia    ITP   dx in 1990  . BRCA negative 08/2018   MyRisk neg except AXIN2/RNF43 VUS  . Family history of breast cancer 08/2018   IBIS=19.9%  . History of arthritis    in hips; now resolved after hip surgery  . ITP (idiopathic thrombocytopenic purpura) 05/01/2015    SURGICAL HISTORY: Past Surgical History:  Procedure Laterality Date  . COLONOSCOPY  09/18/2008   Dr. ElVira Agar. HAND SURGERY     right hand  . spleenectomy  1991   x 2  . TOTAL HIP ARTHROPLASTY Right 11/28/2013   Procedure: TOTAL HIP ARTHROPLASTY ANTERIOR APPROACH;  Surgeon: JoAlta CorningMD;  [place] [facility]: [facility]opedics;  Laterality: Right;  Right total hip arthroplasty anterior approach  . TOTAL HIP ARTHROPLASTY Left 05/15/2014   Procedure: TOTAL HIP ARTHROPLASTY ANTERIOR APPROACH;  Surgeon: JoAlta CorningMD;  [place] [facility] Service: [place]ics;  Laterality: Left;  . TUBAL LIGATION      SOCIAL HISTORY: Social History   Socioeconomic History  . Marital status: Married    Spouse name: Not on file  . Number of children: Not on file  . Years of education: Not on file  . Highest education level: Not on file  Occupational History  . Not on file  Social Needs  . Financial resource strain: Not on file  . Food insecurity    Worry: Not on file    Inability: Not on file  . Transportation needs    Medical: Not on file    Non-medical: Not on file  Tobacco Use  . Smoking status: Never Smoker  . Smokeless tobacco: Never Used  Substance and Sexual Activity  . Alcohol use: Yes    Alcohol/week: 0.0 standard drinks    Comment: occasionally  . Drug use: No  . Sexual activity: Yes    Partners: Male    Birth control/protection: Post-menopausal  Lifestyle  . Physical activity    Days per week: Not on file    Minutes per session: Not on file  . Stress: Not on file  Relationships  . Social  Herbalist on phone: Not on file    Gets together: Not on file    Attends religious service: Not on file    Active member of club or organization: Not on file    Attends meetings of clubs or organizations: Not on file    Relationship status: Not on file  . Intimate partner violence    Fear of current or ex partner: Not on file    Emotionally abused: Not on file    Physically abused: Not on file    Forced sexual activity: Not on file  Other Topics Concern  . Not on file  Social History Narrative  . Not on file    FAMILY HISTORY: Family History  Problem Relation Age of Onset  . Lung disease Mother   . Breast cancer Paternal Aunt 35       x 2 (first time late 91s), same breast  . Prostate cancer Paternal Uncle 56  . Bone cancer Paternal Uncle 50  . Brain cancer Paternal Uncle 30  . Lung cancer Paternal Uncle 69  . Asthma Father     ALLERGIES:  is allergic to nsaids; aspirin; and citrus.  MEDICATIONS:  Current Outpatient Medications  Medication Sig Dispense Refill  . acetaminophen (TYLENOL ARTHRITIS PAIN) 650 MG CR tablet Take 650 mg by mouth 2 (two) times daily as needed for pain.    Marland Kitchen albuterol (PROVENTIL HFA;VENTOLIN HFA) 108 (90 Base) MCG/ACT inhaler Inhale 1-2 puffs into the lungs every 6 (six) hours as needed for wheezing or shortness of breath. 1 Inhaler 0  . Calcium Carbonate-Vitamin D (CALCIUM + D PO) Take 1 tablet by mouth daily.    . carboxymethylcellulose (REFRESH TEARS) 0.5 % SOLN Apply to eye.    . Cetirizine HCl 10 MG CAPS Take 1 capsule by mouth as needed. Seasonal allergies    . ELDERBERRY PO Take by mouth.    . furosemide (LASIX) 40 MG tablet Take 40 mg by mouth daily.     . Melatonin 10 MG CAPS Take 10 mg by mouth at bedtime as needed.     . Multiple Vitamin (MULTIVITAMIN WITH MINERALS) TABS tablet Take 1 tablet by mouth daily.    . Naphazoline HCl (CLEAR EYES OP) Place 1 drop into both eyes daily.    . NON FORMULARY Use as directed 1 scoop in  the mouth or throat daily. IT WORKS! ([employer] Greens) pt mixes in water or fruit juice    . PROMACTA 50 MG tablet TAKE 1 TABLET BY MOUTH  EVERY OTHER DAY ON AN EMPTY STOMACH 1 HOUR BEFORE OR 2  HOURS AFTER A MEAL 30 tablet 6  . Vitamin D, Ergocalciferol, (DRISDOL) 1.25 MG (50000 UT) CAPS capsule TAKE 1 CAPSULE BY MOUTH ONE TIME PER WEEK 4 capsule 5  . benzonatate (TESSALON) 100 MG capsule Take by mouth.    Marland Kitchen buPROPion (WELLBUTRIN SR) 150 MG 12 hr tablet Take 150 mg by  mouth daily.      No current facility-administered medications for this visit.       Marland Kitchen  PHYSICAL EXAMINATION: ECOG PERFORMANCE STATUS: 0 - Asymptomatic  Vitals:   02/07/19 1452  BP: (!) 152/82  Pulse: 77  Temp: (!) 97 F (36.1 C)   Filed Weights   02/07/19 1452  Weight: 232 lb (105.2 kg)    Physical Exam  Constitutional: She is oriented to person, place, and time and well-developed, well-nourished, and in no distress.  HENT:  Head: Normocephalic and atraumatic.  Mouth/Throat: Oropharynx is clear and moist. No oropharyngeal exudate.  Eyes: Pupils are equal, round, and reactive to light.  Neck: Normal range of motion. Neck supple.  Cardiovascular: Normal rate and regular rhythm.  Pulmonary/Chest: Effort normal and breath sounds normal. No respiratory distress. She has no wheezes.  Abdominal: Soft. Bowel sounds are normal. She exhibits no distension and no mass. There is no abdominal tenderness. There is no rebound and no guarding.  Musculoskeletal: Normal range of motion.        General: No tenderness or edema.  Neurological: She is alert and oriented to person, place, and time.  Skin: Skin is warm.  Psychiatric: Affect normal.     LABORATORY DATA:  I have reviewed the data as listed Lab Results  Component Value Date   WBC 5.8 02/07/2019   HGB 12.9 02/07/2019   HCT 37.6 02/07/2019   MCV 88.3 02/07/2019   PLT 112 (L) 02/07/2019   Recent Labs    03/16/18 1805 06/15/18 1117 08/09/18 1029  NA 138 138  143  K 4.0 3.6 3.6  CL 105 103 107  CO2 29 26 30   GLUCOSE 108* 89 99  BUN 18 14 12   CREATININE 1.37* 1.28* 1.03*  CALCIUM 9.5 9.3 9.5  GFRNONAA 41* 45* 59*  GFRAA 48* 52* >60  PROT 6.9 7.3 7.1  ALBUMIN 3.9 4.1 3.9  AST 34 34 31  ALT 36 28 33  ALKPHOS 69 69 75  BILITOT 0.6 0.5 0.5    RADIOGRAPHIC STUDIES: I have personally reviewed the radiological images as listed and agreed with the findings in the report. No results found.  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 on Promacta since October 2016.  #Patient currently on Promacta 50 mg twice a week; platelet count is 112 stable.  Continue at current dose [joint pains]   #  "Multiple joints pain/back spasms"-on  vit D 50,0000/week.  Stable.  Question later Promacta.  #Hypertension-blood pressures 140s to 150s.  Recommend checking blood pressure at home.  #Leg swelling-venous stasis versus others.  Stable.  If worse recommend compression stockings.  DISPOSITION:    # cbc in 3 months #  follow up in 6 months-MD; labs cbc/cmp- Dr.B     All questions were answered. The patient knows to call the clinic with any problems, questions or concerns.     Cammie Sickle, MD 02/07/2019 4:01 PM

## 2019-02-07 NOTE — Progress Notes (Signed)
Patient stated that she started to have varicose veins on her right leg and wanted to make sure it was okay.

## 2019-02-07 NOTE — Assessment & Plan Note (Addendum)
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 on Promacta since October 2016.  #Patient currently on Promacta 50 mg twice a week; platelet count is 112 stable.  Continue at current dose [joint pains]   #  "Multiple joints pain/back spasms"-on  vit D 50,0000/week.  Stable.  Question later Promacta.  #Hypertension-blood pressures 140s to 150s.  Recommend checking blood pressure at home.  #Leg swelling-venous stasis versus others.  Stable.  If worse recommend compression stockings.  DISPOSITION:    # cbc in 3 months #  follow up in 6 months-MD; labs cbc/cmp- Dr.B

## 2019-02-17 ENCOUNTER — Other Ambulatory Visit
Admission: RE | Admit: 2019-02-17 | Discharge: 2019-02-17 | Disposition: A | Discharge status: Home / Self Care | Payer: 59 | Source: Ambulatory Visit | Attending: Unknown Physician Specialty | Admitting: Unknown Physician Specialty

## 2019-02-17 ENCOUNTER — Other Ambulatory Visit: Payer: Self-pay

## 2019-02-17 DIAGNOSIS — Z1159 Encounter for screening for other viral diseases: Secondary | ICD-10-CM | POA: Insufficient documentation

## 2019-02-17 DIAGNOSIS — Z01812 Encounter for preprocedural laboratory examination: Secondary | ICD-10-CM | POA: Diagnosis present

## 2019-02-18 LAB — SARS CORONAVIRUS 2 (TAT 6-24 HRS): SARS Coronavirus 2: NEGATIVE

## 2019-02-23 ENCOUNTER — Ambulatory Visit: Payer: 59 | Admitting: Certified Registered Nurse Anesthetist

## 2019-02-23 ENCOUNTER — Encounter: Payer: Self-pay | Admitting: *Deleted

## 2019-02-23 ENCOUNTER — Encounter
Admission: RE | Disposition: A | Discharge status: Home / Self Care | Payer: Self-pay | Source: Home / Self Care | Attending: Internal Medicine

## 2019-02-23 ENCOUNTER — Ambulatory Visit
Admission: RE | Admit: 2019-02-23 | Discharge: 2019-02-23 | Disposition: A | Discharge status: Home / Self Care | Payer: 59 | Attending: Internal Medicine | Admitting: Internal Medicine

## 2019-02-23 ENCOUNTER — Other Ambulatory Visit: Payer: Self-pay

## 2019-02-23 DIAGNOSIS — Z96643 Presence of artificial hip joint, bilateral: Secondary | ICD-10-CM | POA: Insufficient documentation

## 2019-02-23 DIAGNOSIS — K640 First degree hemorrhoids: Secondary | ICD-10-CM | POA: Insufficient documentation

## 2019-02-23 DIAGNOSIS — J45909 Unspecified asthma, uncomplicated: Secondary | ICD-10-CM | POA: Insufficient documentation

## 2019-02-23 DIAGNOSIS — Z1211 Encounter for screening for malignant neoplasm of colon: Secondary | ICD-10-CM | POA: Insufficient documentation

## 2019-02-23 DIAGNOSIS — K5730 Diverticulosis of large intestine without perforation or abscess without bleeding: Secondary | ICD-10-CM | POA: Insufficient documentation

## 2019-02-23 HISTORY — PX: COLONOSCOPY WITH PROPOFOL: SHX5780

## 2019-02-23 SURGERY — COLONOSCOPY WITH PROPOFOL
Anesthesia: General

## 2019-02-23 MED ORDER — SODIUM CHLORIDE 0.9 % IV SOLN: INTRAVENOUS | Status: DC

## 2019-02-23 MED ORDER — GLYCOPYRROLATE 0.2 MG/ML IJ SOLN
INTRAMUSCULAR | Status: AC
  Filled 2019-02-23: qty 1

## 2019-02-23 MED ORDER — SUCCINYLCHOLINE CHLORIDE 20 MG/ML IJ SOLN
INTRAMUSCULAR | Status: AC
  Filled 2019-02-23: qty 1

## 2019-02-23 MED ORDER — SODIUM CHLORIDE 0.9 % IV SOLN
INTRAVENOUS | Status: DC
  Administered 2019-02-23: 08:00:00 via INTRAVENOUS

## 2019-02-23 MED ORDER — LIDOCAINE HCL (PF) 2 % IJ SOLN
INTRAMUSCULAR | Status: AC
  Filled 2019-02-23: qty 30

## 2019-02-23 MED ORDER — PIPERACILLIN-TAZOBACTAM 3.375 G IVPB
INTRAVENOUS | Status: AC
  Filled 2019-02-23: qty 50

## 2019-02-23 MED ORDER — EPHEDRINE SULFATE 50 MG/ML IJ SOLN
INTRAMUSCULAR | Status: AC
  Filled 2019-02-23: qty 1

## 2019-02-23 MED ORDER — PHENYLEPHRINE HCL (PRESSORS) 10 MG/ML IV SOLN
INTRAVENOUS | Status: AC
  Filled 2019-02-23: qty 1

## 2019-02-23 MED ORDER — PIPERACILLIN-TAZOBACTAM 3.375 G IVPB
3.3750 g | Freq: Once | INTRAVENOUS | Status: AC
  Administered 2019-02-23: 08:00:00 3.375 g via INTRAVENOUS

## 2019-02-23 MED ORDER — PROPOFOL 500 MG/50ML IV EMUL
INTRAVENOUS | Status: AC
  Filled 2019-02-23: qty 100

## 2019-02-23 MED ORDER — EPHEDRINE SULFATE 50 MG/ML IJ SOLN
INTRAMUSCULAR | Status: DC | PRN
  Administered 2019-02-23: 10 mg via INTRAVENOUS

## 2019-02-23 MED ORDER — LIDOCAINE HCL (PF) 2 % IJ SOLN
INTRAMUSCULAR | Status: AC
  Filled 2019-02-23: qty 10

## 2019-02-23 MED ORDER — PROPOFOL 10 MG/ML IV BOLUS
INTRAVENOUS | Status: DC | PRN
  Administered 2019-02-23: 20 mg via INTRAVENOUS
  Administered 2019-02-23: 70 mg via INTRAVENOUS

## 2019-02-23 MED ORDER — PROPOFOL 500 MG/50ML IV EMUL
INTRAVENOUS | Status: DC | PRN
  Administered 2019-02-23: 160 ug/kg/min via INTRAVENOUS

## 2019-02-23 NOTE — Anesthesia Preprocedure Evaluation (Addendum)
Anesthesia Evaluation  Patient identified by MRN, date of birth, ID band Patient awake    Reviewed: Allergy & Precautions, H&P , NPO status , Patient's Chart, lab work & pertinent test results  Airway Mallampati: II  TM Distance: >3 FB Neck ROM: full    Dental  (+) Teeth Intact   Pulmonary asthma ,           Cardiovascular negative cardio ROS       Neuro/Psych negative neurological ROS  negative psych ROS   GI/Hepatic negative GI ROS, Neg liver ROS,   Endo/Other  negative endocrine ROS  Renal/GU negative Renal ROS  negative genitourinary   Musculoskeletal   Abdominal   Peds  Hematology  (+) Blood dyscrasia (ITP), ,   Anesthesia Other Findings Past Medical History: No date: Asthma No date: Blood dyscrasia     Comment:  ITP   dx in 1990 08/2018: BRCA negative     Comment:  MyRisk neg except AXIN2/RNF43 VUS 08/2018: Family history of breast cancer     Comment:  IBIS=19.9% No date: History of arthritis     Comment:  in hips; now resolved after hip surgery 05/01/2015: ITP (idiopathic thrombocytopenic purpura)  Past Surgical History: 09/18/2008: COLONOSCOPY     Comment:  Dr. Vira Agar No date: HAND SURGERY     Comment:  right hand 1991: spleenectomy     Comment:  x 2 11/28/2013: TOTAL HIP ARTHROPLASTY; Right     Comment:  Procedure: TOTAL HIP ARTHROPLASTY ANTERIOR APPROACH;                Surgeon: Alta Corning, MD;  Location: [place];  Service:               Orthopedics;  Laterality: Right;  Right total hip               arthroplasty anterior approach 05/15/2014: TOTAL HIP ARTHROPLASTY; Left     Comment:  Procedure: TOTAL HIP ARTHROPLASTY ANTERIOR APPROACH;                Surgeon: Alta Corning, MD;  Location: [place];  Service:               Orthopedics;  Laterality: Left; No date: TUBAL LIGATION     Reproductive/Obstetrics negative OB ROS                            Anesthesia  Physical Anesthesia Plan  ASA: II  Anesthesia Plan: General   Post-op Pain Management:    Induction:   PONV Risk Score and Plan: Propofol infusion and TIVA  Airway Management Planned: Nasal Cannula and Natural Airway  Additional Equipment:   Intra-op Plan:   Post-operative Plan:   Informed Consent: I have reviewed the patients History and Physical, chart, labs and discussed the procedure including the risks, benefits and alternatives for the proposed anesthesia with the patient or authorized representative who has indicated his/her understanding and acceptance.     Dental Advisory Given  Plan Discussed with: Anesthesiologist and CRNA  Anesthesia Plan Comments:        Anesthesia Quick Evaluation

## 2019-02-23 NOTE — H&P (Signed)
Outpatient short stay form Pre-procedure 02/23/2019 8:19 AM Teodoro K. Alice Reichert, M.D.  Primary Physician: Ezequiel Kayser, M.D.  Reason for visit:  Colon cancer screening  History of present illness:  Patient presents for colonoscopy for colon cancer screening. The patient denies complaints of abdominal pain, significant change in bowel habits, or rectal bleeding.     Current Facility-Administered Medications:  .  0.9 %  sodium chloride infusion, , Intravenous, Continuous, Gaylyn Cheers T, MD .  0.9 %  sodium chloride infusion, , Intravenous, Continuous, [place], Benay Pike, MD, Last Rate: 20 mL/hr at 02/23/19 0749 .  piperacillin-tazobactam (ZOSYN) 3.375 GM/50ML IVPB, , , ,  .  piperacillin-tazobactam (ZOSYN) IVPB 3.375 g, 3.375 g, Intravenous, Once, [place], Benay Pike, MD, Last Rate: 12.5 mL/hr at 02/23/19 0749, 3.375 g at 02/23/19 0749  Medications Prior to Admission  Medication Sig Dispense Refill Last Dose  . acetaminophen (TYLENOL ARTHRITIS PAIN) 650 MG CR tablet Take 650 mg by mouth 2 (two) times daily as needed for pain.   Past Week at Unknown time  . albuterol (PROVENTIL HFA;VENTOLIN HFA) 108 (90 Base) MCG/ACT inhaler Inhale 1-2 puffs into the lungs every 6 (six) hours as needed for wheezing or shortness of breath. 1 Inhaler 0 Past Month at Unknown time  . buPROPion (WELLBUTRIN SR) 150 MG 12 hr tablet Take 150 mg by mouth daily.    02/22/2019 at Unknown time  . Calcium Carbonate-Vitamin D (CALCIUM + D PO) Take 1 tablet by mouth daily.   Past Week at Unknown time  . carboxymethylcellulose (REFRESH TEARS) 0.5 % SOLN Apply to eye.   02/22/2019 at Unknown time  . Cetirizine HCl 10 MG CAPS Take 1 capsule by mouth as needed. Seasonal allergies   Past Week at Unknown time  . ELDERBERRY PO Take by mouth.   Past Month at Unknown time  . furosemide (LASIX) 40 MG tablet Take 40 mg by mouth daily.    02/23/2019 at 0600  . Melatonin 10 MG CAPS Take 10 mg by mouth at bedtime as needed.    Past Month at  Unknown time  . Multiple Vitamin (MULTIVITAMIN WITH MINERALS) TABS tablet Take 1 tablet by mouth daily.   Past Week at Unknown time  . Naphazoline HCl (CLEAR EYES OP) Place 1 drop into both eyes daily.   02/22/2019 at Unknown time  . NON FORMULARY Use as directed 1 scoop in the mouth or throat daily. IT WORKS! ([employer]) pt mixes in water or fruit juice   Past Week at Unknown time  . PROMACTA 50 MG tablet TAKE 1 TABLET BY MOUTH  EVERY OTHER DAY ON AN EMPTY STOMACH 1 HOUR BEFORE OR 2  HOURS AFTER A MEAL 30 tablet 6 Past Week at Unknown time  . Vitamin D, Ergocalciferol, (DRISDOL) 1.25 MG (50000 UT) CAPS capsule TAKE 1 CAPSULE BY MOUTH ONE TIME PER WEEK 4 capsule 5 Past Week at Unknown time  . benzonatate (TESSALON) 100 MG capsule Take by mouth.   Not Taking at Unknown time     Allergies  Allergen Reactions  . Nsaids Rash    Contraindicated by ITP, per Dr. Inez Pilgrim  . Aspirin     History of low platelets  . Citrus     itch     Past Medical History:  Diagnosis Date  . Asthma   . Blood dyscrasia    ITP   dx in 1990  . BRCA negative 08/2018   MyRisk neg except AXIN2/RNF43 VUS  . Family history  of breast cancer 08/2018   IBIS=19.9%  . History of arthritis    in hips; now resolved after hip surgery  . ITP (idiopathic thrombocytopenic purpura) 05/01/2015    Review of systems:  Otherwise negative.    Physical Exam  Gen: Alert, oriented. Appears stated age.  HEENT: [place]/AT. PERRLA. Lungs: CTA, no wheezes. CV: RR nl S1, S2. Abd: soft, benign, no masses. BS+ Ext: No edema. Pulses 2+    Planned procedures: Proceed with colonoscopy. The patient understands the nature of the planned procedure, indications, risks, alternatives and potential complications including but not limited to bleeding, infection, perforation, damage to internal organs and possible oversedation/side effects from anesthesia. The patient agrees and gives consent to proceed.  Please refer to procedure notes for  findings, recommendations and patient disposition/instructions.     Teodoro K. Alice Reichert, M.D. Gastroenterology 02/23/2019  8:19 AM

## 2019-02-23 NOTE — Anesthesia Post-op Follow-up Note (Signed)
Anesthesia QCDR form completed.        

## 2019-02-23 NOTE — Anesthesia Procedure Notes (Signed)
Performed by: Nakoa Ganus, CRNA Pre-anesthesia Checklist: Patient identified, Emergency Drugs available, Suction available, Patient being monitored and Timeout performed Patient Re-evaluated:Patient Re-evaluated prior to induction Oxygen Delivery Method: Nasal cannula Induction Type: IV induction       

## 2019-02-23 NOTE — Transfer of Care (Signed)
Immediate Anesthesia Transfer of Care Note  Patient: Victoria Morgan  Procedure(s) Performed: COLONOSCOPY WITH PROPOFOL (N/A )  Patient Location: PACU  Anesthesia Type:General  Level of Consciousness: drowsy  Airway & Oxygen Therapy: Patient Spontanous Breathing  Post-op Assessment: Report given to RN and Post -op Vital signs reviewed and stable  Post vital signs: Reviewed and stable  Last Vitals:  Vitals Value Taken Time  BP 93/46 02/23/19 0844  Temp 36.4 C 02/23/19 0840  Pulse 77 02/23/19 0844  Resp 22 02/23/19 0844  SpO2 100 % 02/23/19 0844  Vitals shown include unvalidated device data.  Last Pain:  Vitals:   02/23/19 0840  TempSrc: Tympanic  PainSc:          Complications: No apparent anesthesia complications

## 2019-02-23 NOTE — Op Note (Signed)
[facility] Gastroenterology Patient Name: Silvio PateShelia Degreaffenreidt Procedure Date: 02/23/2019 8:00 AM MRN: [id number] Account #: [id number] Date of Birth: 10/29/1955 Admit Type: Outpatient Age: 63 [facility] 4 Gender: Female Note Status: Finalized Procedure:            Colonoscopy Indications:          Screening for colorectal malignant neoplasm Providers:            Boykin Nearingeodoro K. [place] MD, MD Medicines:            Propofol per Anesthesia Complications:        No immediate complications. Procedure:            Pre-Anesthesia Assessment:                       - The risks and benefits of the procedure and the                        sedation options and risks were discussed with the                        patient. All questions were answered and informed                        consent was obtained.                       - Patient identification and proposed procedure were                        verified prior to the procedure by the nurse. The                        procedure was verified in the procedure room.                       - ASA Grade Assessment: III - A patient with severe                        systemic disease.                       - After reviewing the risks and benefits, the patient                        was deemed in satisfactory condition to undergo the                        procedure.                       After obtaining informed consent, the colonoscope was                        passed under direct vision. Throughout the procedure,                        the patient's blood pressure, pulse, and oxygen                        saturations were monitored continuously. The  Colonoscope was introduced through the anus and                        advanced to the the cecum, identified by appendiceal                        orifice and ileocecal valve. The colonoscopy was                        performed without difficulty.  The patient tolerated the                        procedure well. The quality of the bowel preparation                        was good. The ileocecal valve, appendiceal orifice, and                        rectum were photographed. Findings:      The perianal and digital rectal examinations were normal. Pertinent       negatives include normal sphincter tone and no palpable rectal lesions.      Many small-mouthed diverticula were found in the sigmoid colon.      Non-bleeding internal hemorrhoids were found during retroflexion. The       hemorrhoids were Grade I (internal hemorrhoids that do not prolapse).      The exam was otherwise without abnormality. Impression:           - Diverticulosis in the sigmoid colon.                       - Non-bleeding internal hemorrhoids.                       - The examination was otherwise normal.                       - No specimens collected. Recommendation:       - Patient has a contact number available for                        emergencies. The signs and symptoms of potential                        delayed complications were discussed with the patient.                        Return to normal activities tomorrow. Written discharge                        instructions were provided to the patient.                       - Resume previous diet.                       - Continue present medications.                       - Repeat colonoscopy in 10 years for screening purposes.                       -  Return to GI office PRN.                       - The findings and recommendations were discussed with                        the patient. Procedure Code(s):    --- Professional ---                       Z6109, Colorectal cancer screening; colonoscopy on                        individual not meeting criteria for high risk Diagnosis Code(s):    --- Professional ---                       K57.30, Diverticulosis of large intestine without                         perforation or abscess without bleeding                       K64.0, First degree hemorrhoids                       Z12.11, Encounter for screening for malignant neoplasm                        of colon CPT copyright 2019 American Medical Association. All rights reserved. The codes documented in this report are preliminary and upon coder review may  be revised to meet current compliance requirements. Efrain Sella MD, MD 02/23/2019 8:43:40 AM This report has been signed electronically. Number of Addenda: 0 Note Initiated On: 02/23/2019 8:00 AM Scope Withdrawal Time: 0 hours 10 minutes 3 seconds  Total Procedure Duration: 0 hours 14 minutes 35 seconds  Estimated Blood Loss: Estimated blood loss: none.      [facility]

## 2019-02-23 NOTE — Anesthesia Postprocedure Evaluation (Signed)
Anesthesia Post Note  Patient: Victoria Morgan  Procedure(s) Performed: COLONOSCOPY WITH PROPOFOL (N/A )  Patient location during evaluation: PACU Anesthesia Type: General Level of consciousness: awake and alert Pain management: pain level controlled Vital Signs Assessment: post-procedure vital signs reviewed and stable Respiratory status: spontaneous breathing, nonlabored ventilation and respiratory function stable Cardiovascular status: blood pressure returned to baseline and stable Postop Assessment: no apparent nausea or vomiting Anesthetic complications: no     Last Vitals:  Vitals:   02/23/19 0840 02/23/19 0850  BP: (!) 93/46 109/67  Pulse: 77 81  Resp:  (!) 24  Temp: 36.4 C   SpO2: 100% 100%    Last Pain:  Vitals:   02/23/19 0840  TempSrc: Tympanic  PainSc:                  Durenda Hurt

## 2019-02-23 NOTE — Interval H&P Note (Signed)
History and Physical Interval Note:  02/23/2019 8:20 AM  Victoria Morgan  has presented today for surgery, with the diagnosis of CCA SREEN.  The various methods of treatment have been discussed with the patient and family. After consideration of risks, benefits and other options for treatment, the patient has consented to  Procedure(s): COLONOSCOPY WITH PROPOFOL (N/A) as a surgical intervention.  The patient's history has been reviewed, patient examined, no change in status, stable for surgery.  I have reviewed the patient's chart and labs.  Questions were answered to the patient's satisfaction.     [place], [place]

## 2019-02-24 ENCOUNTER — Encounter: Payer: Self-pay | Admitting: Internal Medicine

## 2019-04-25 ENCOUNTER — Other Ambulatory Visit: Payer: Self-pay | Admitting: Internal Medicine

## 2019-04-26 DIAGNOSIS — R809 Proteinuria, unspecified: Secondary | ICD-10-CM | POA: Insufficient documentation

## 2019-05-05 ENCOUNTER — Other Ambulatory Visit: Payer: Self-pay | Admitting: *Deleted

## 2019-05-05 MED ORDER — ELTROMBOPAG OLAMINE 50 MG PO TABS: ORAL_TABLET | ORAL | 6 refills | Status: DC

## 2019-05-10 ENCOUNTER — Other Ambulatory Visit: Payer: Self-pay

## 2019-05-10 ENCOUNTER — Inpatient Hospital Stay: Payer: 59 | Attending: Internal Medicine

## 2019-05-10 ENCOUNTER — Inpatient Hospital Stay: Payer: 59

## 2019-05-10 ENCOUNTER — Telehealth: Payer: Self-pay | Admitting: *Deleted

## 2019-05-10 DIAGNOSIS — D693 Immune thrombocytopenic purpura: Secondary | ICD-10-CM | POA: Insufficient documentation

## 2019-05-10 DIAGNOSIS — I10 Essential (primary) hypertension: Secondary | ICD-10-CM | POA: Diagnosis not present

## 2019-05-10 DIAGNOSIS — Z79899 Other long term (current) drug therapy: Secondary | ICD-10-CM | POA: Insufficient documentation

## 2019-05-10 LAB — COMPREHENSIVE METABOLIC PANEL
ALT: 30 U/L (ref 0–44)
AST: 29 U/L (ref 15–41)
Albumin: 4 g/dL (ref 3.5–5.0)
Alkaline Phosphatase: 69 U/L (ref 38–126)
Anion gap: 7 (ref 5–15)
BUN: 15 mg/dL (ref 6–20)
CO2: 23 mmol/L (ref 22–32)
Calcium: 9.1 mg/dL (ref 8.9–10.3)
Chloride: 106 mmol/L (ref 98–111)
Creatinine, Ser: 1.24 mg/dL — ABNORMAL HIGH (ref 0.44–1.00)
GFR calc Af Amer: 55 mL/min — ABNORMAL LOW (ref >60)
GFR calc non Af Amer: 47 mL/min — ABNORMAL LOW (ref >60)
Glucose, Bld: 113 mg/dL — ABNORMAL HIGH (ref 70–99)
Potassium: 3.7 mmol/L (ref 3.5–5.1)
Sodium: 136 mmol/L (ref 135–145)
Total Bilirubin: 0.7 mg/dL (ref 0.3–1.2)
Total Protein: 7.1 g/dL (ref 6.5–8.1)

## 2019-05-10 LAB — CBC WITH DIFFERENTIAL/PLATELET
Abs Immature Granulocytes: 0.02 10*3/uL (ref 0.00–0.07)
Basophils Absolute: 0 10*3/uL (ref 0.0–0.1)
Basophils Relative: 1 %
Eosinophils Absolute: 0.2 10*3/uL (ref 0.0–0.5)
Eosinophils Relative: 4 %
HCT: 36.6 % (ref 36.0–46.0)
Hemoglobin: 12.8 g/dL (ref 12.0–15.0)
Immature Granulocytes: 0 %
Lymphocytes Relative: 31 %
Lymphs Abs: 1.5 10*3/uL (ref 0.7–4.0)
MCH: 30.5 pg (ref 26.0–34.0)
MCHC: 35 g/dL (ref 30.0–36.0)
MCV: 87.1 fL (ref 80.0–100.0)
Monocytes Absolute: 0.4 10*3/uL (ref 0.1–1.0)
Monocytes Relative: 9 %
Neutro Abs: 2.7 10*3/uL (ref 1.7–7.7)
Neutrophils Relative %: 55 %
Platelets: 79 10*3/uL — ABNORMAL LOW (ref 150–400)
RBC: 4.2 MIL/uL (ref 3.87–5.11)
RDW: 13.8 % (ref 11.5–15.5)
WBC: 4.9 10*3/uL (ref 4.0–10.5)
nRBC: 0 % (ref 0.0–0.2)

## 2019-05-10 NOTE — Telephone Encounter (Signed)
Patient called asking for her platelet count results  CBC with Differential/Platelet Order: [id number] Status:  Final result Visible to patient:  No (not released) Next appt:  08/08/2019 at 09:45 AM in Oncology ([facility]) Dx:  Idiopathic thrombocytopenic purpura (...  Ref Range & Units 10:06 52mo ago 23mo ago  WBC 4.0 - 10.5 K/uL 4.9  5.8  5.5   RBC 3.87 - 5.11 MIL/uL 4.20  4.26  4.14   Hemoglobin 12.0 - 15.0 g/dL 12.8  12.9  12.5   HCT 36.0 - 46.0 % 36.6  37.6  36.5   MCV 80.0 - 100.0 fL 87.1  88.3  88.2   MCH 26.0 - 34.0 pg 30.5  30.3  30.2   MCHC 30.0 - 36.0 g/dL 35.0  34.3  34.2   RDW 11.5 - 15.5 % 13.8  13.9  14.3   Platelets 150 - 400 K/uL 79Low   112Low   106Low    Comment: Immature Platelet Fraction may be  clinically indicated, consider  ordering this additional test  [id number]   nRBC 0.0 - 0.2 % 0.0  0.0  0.0   Neutrophils Relative % % 55  56  50   Neutro Abs 1.7 - 7.7 K/uL 2.7  3.2  2.7   Lymphocytes Relative % 31  31  35   Lymphs Abs 0.7 - 4.0 K/uL 1.5  1.8  1.9   Monocytes Relative % 9  9  10    Monocytes Absolute 0.1 - 1.0 K/uL 0.4  0.5  0.6   Eosinophils Relative % 4  3  4    Eosinophils Absolute 0.0 - 0.5 K/uL 0.2  0.2  0.2   Basophils Relative % 1  1  1    Basophils Absolute 0.0 - 0.1 K/uL 0.0  0.0  0.1   Immature Granulocytes % 0  0  0   Abs Immature Granulocytes 0.00 - 0.07 K/uL 0.02  0.01 CM  0.01 CM   Comment: Performed at [facility], [address] [place], [place] [address]  Resulting Agency  [facility] CLIN LAB [facility] CL[facility] LAB [facility] LAB      Specimen Collected: 05/10/19 10:06 Last Resulted: 05/10/19 10:21

## 2019-05-10 NOTE — Telephone Encounter (Signed)
Hassan Rowan please inform patient that I sent her a my chart message; call office if any questions.

## 2019-05-10 NOTE — Telephone Encounter (Signed)
Patient states she saw the messaage

## 2019-08-04 ENCOUNTER — Other Ambulatory Visit: Payer: Self-pay | Admitting: *Deleted

## 2019-08-04 DIAGNOSIS — D693 Immune thrombocytopenic purpura: Secondary | ICD-10-CM

## 2019-08-08 ENCOUNTER — Other Ambulatory Visit: Payer: 59

## 2019-08-08 ENCOUNTER — Inpatient Hospital Stay: Payer: 59

## 2019-08-08 ENCOUNTER — Encounter: Payer: Self-pay | Admitting: Internal Medicine

## 2019-08-08 ENCOUNTER — Inpatient Hospital Stay: Payer: 59 | Attending: Internal Medicine | Admitting: Internal Medicine

## 2019-08-08 ENCOUNTER — Other Ambulatory Visit: Payer: Self-pay

## 2019-08-08 ENCOUNTER — Ambulatory Visit: Payer: 59 | Admitting: Internal Medicine

## 2019-08-08 DIAGNOSIS — D693 Immune thrombocytopenic purpura: Secondary | ICD-10-CM | POA: Diagnosis present

## 2019-08-08 DIAGNOSIS — Z79899 Other long term (current) drug therapy: Secondary | ICD-10-CM | POA: Insufficient documentation

## 2019-08-08 LAB — COMPREHENSIVE METABOLIC PANEL
ALT: 33 U/L (ref 0–44)
AST: 27 U/L (ref 15–41)
Albumin: 4 g/dL (ref 3.5–5.0)
Alkaline Phosphatase: 73 U/L (ref 38–126)
Anion gap: 5 (ref 5–15)
BUN: 15 mg/dL (ref 8–23)
CO2: 29 mmol/L (ref 22–32)
Calcium: 9.4 mg/dL (ref 8.9–10.3)
Chloride: 107 mmol/L (ref 98–111)
Creatinine, Ser: 1.19 mg/dL — ABNORMAL HIGH (ref 0.44–1.00)
GFR calc Af Amer: 57 mL/min — ABNORMAL LOW (ref >60)
GFR calc non Af Amer: 49 mL/min — ABNORMAL LOW (ref >60)
Glucose, Bld: 99 mg/dL (ref 70–99)
Potassium: 3.7 mmol/L (ref 3.5–5.1)
Sodium: 141 mmol/L (ref 135–145)
Total Bilirubin: 0.4 mg/dL (ref 0.3–1.2)
Total Protein: 7.2 g/dL (ref 6.5–8.1)

## 2019-08-08 LAB — CBC WITH DIFFERENTIAL/PLATELET
Abs Immature Granulocytes: 0.01 10*3/uL (ref 0.00–0.07)
Basophils Absolute: 0.1 10*3/uL (ref 0.0–0.1)
Basophils Relative: 1 %
Eosinophils Absolute: 0.2 10*3/uL (ref 0.0–0.5)
Eosinophils Relative: 5 %
HCT: 38.4 % (ref 36.0–46.0)
Hemoglobin: 12.7 g/dL (ref 12.0–15.0)
Immature Granulocytes: 0 %
Lymphocytes Relative: 28 %
Lymphs Abs: 1.4 10*3/uL (ref 0.7–4.0)
MCH: 29.7 pg (ref 26.0–34.0)
MCHC: 33.1 g/dL (ref 30.0–36.0)
MCV: 89.7 fL (ref 80.0–100.0)
Monocytes Absolute: 0.5 10*3/uL (ref 0.1–1.0)
Monocytes Relative: 9 %
Neutro Abs: 2.8 10*3/uL (ref 1.7–7.7)
Neutrophils Relative %: 57 %
Platelets: 78 10*3/uL — ABNORMAL LOW (ref 150–400)
RBC: 4.28 MIL/uL (ref 3.87–5.11)
RDW: 13.9 % (ref 11.5–15.5)
WBC: 5 10*3/uL (ref 4.0–10.5)
nRBC: 0 % (ref 0.0–0.2)

## 2019-08-08 NOTE — Assessment & Plan Note (Addendum)
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 on Promacta since October 2016.  #Patient currently on Promacta 50 mg twice a week; platelet count is 78; stable.  Continue at current dose [joint pains]   #  "Multiple joints pain/back spasms"-on  vit D 50,0000/week. STABLE; improved on exercise.   DISPOSITION:    # cbc in 3 months #  follow up in 6 months-MD; labs cbc/cmp- Dr.B

## 2019-08-08 NOTE — Progress Notes (Signed)
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     HISTORY OF PRESENTING ILLNESS:  Victoria Morgan [age].o.  female  African-American female patient with a history of chronic refractory ITP currently on Promacta 50 mg twice a week is here for follow-up.   Patient continues to have intermittent joint pains.  She continues to be on vitamin D.  She notes to have her joint pains improved while exercising.   Denies increased bruising or bleeding.  No new lumps or bumps.   Review of Systems  Constitutional: Negative for chills, diaphoresis, fever, malaise/fatigue and weight loss.  HENT: Negative for nosebleeds and sore throat.   Eyes: Negative for double vision.  Respiratory: Negative for cough, hemoptysis, sputum production, shortness of breath and wheezing.   Cardiovascular: Negative for chest pain, palpitations, orthopnea and leg swelling.  Gastrointestinal: Negative for abdominal pain, blood in stool, constipation, diarrhea, heartburn, melena, nausea and vomiting.  Genitourinary: Negative for dysuria, frequency and urgency.  Musculoskeletal: Positive for back pain and joint pain.  Skin: Negative.  Negative for itching and rash.  Neurological: Negative for dizziness, tingling, focal weakness, weakness and headaches.  Endo/Heme/Allergies: Does not bruise/bleed easily.  Psychiatric/Behavioral: Negative for depression. The patient is not nervous/anxious and does not have insomnia.       MEDICAL HISTORY:  Past Medical History:  Diagnosis Date  . Asthma   . Blood dyscrasia    ITP   dx in 1990  . BRCA negative 08/2018   MyRisk neg except AXIN2/RNF43 VUS  . Family history of breast cancer 08/2018   IBIS=19.9%  . History of arthritis    in hips; now resolved after hip surgery  . ITP (idiopathic thrombocytopenic purpura) 05/01/2015    SURGICAL HISTORY: Past Surgical History:  Procedure Laterality Date  . COLONOSCOPY  09/18/2008   Dr. ElVira Agar. COLONOSCOPY WITH PROPOFOL N/A 02/23/2019   Procedure: COLONOSCOPY WITH PROPOFOL;  Surgeon: Toledo, TeBenay PikeMD;  Location: [facility] ENDOSCOPY;  Service: Endoscopy;  Laterality: N/A;  . HAND SURGERY     right hand  . spleenectomy  1991   x 2  . TOTAL HIP ARTHROPLASTY Right 11/28/2013   Procedure: TOTAL HIP ARTHROPLASTY ANTERIOR APPROACH;  Surgeon: JoAlta CorningMD;  [facility]: [facility];  Laterality: Right;  Right total hip arthroplasty anterior approach  . TOTAL HIP ARTHROPLASTY Left 05/15/2014   Procedure: TOTAL HIP ARTHROPLASTY ANTERIOR APPROACH;  Surgeon: JoAlta CorningMD;  [facility]: [facility]ics;  Laterality: Left;  . TUBAL LIGATION      SOCIAL HISTORY: Social History   Socioeconomic History  . Marital status: Married    Spouse name: Not on file  . Number of children: Not on file  . Years of education: Not on file  . Highest education level: Not on file  Occupational History  . Not on file  Tobacco Use  . Smoking status: Never Smoker  . Smokeless tobacco: Never Used  Substance and Sexual Activity  . Alcohol  use: Not Currently    Alcohol/week: 0.0 standard drinks    Comment: occasionally  . Drug use: No  . Sexual activity: Yes    Partners: Male    Birth control/protection: Post-menopausal  Other Topics Concern  . Not on file  Social History Narrative  . Not on file   Social Determinants of Health   Financial Resource Strain:   . Difficulty of Paying Living Expenses:  Not on file  Food Insecurity:   . Worried About Charity fundraiser in the Last Year: Not on file  . Ran Out of Food in the Last Year: Not on file  Transportation Needs:   . Lack of Transportation (Medical): Not on file  . Lack of Transportation (Non-Medical): Not on file  Physical Activity:   . Days of Exercise per Week: Not on file  . Minutes of Exercise per Session: Not on file  Stress:   . Feeling of Stress : Not on file  Social Connections:   . Frequency of Communication with Friends and Family: Not on file  . Frequency of Social Gatherings with Friends and Family: Not on file  . Attends Religious Services: Not on file  . Active Member of Clubs or Organizations: Not on file  . Attends Archivist Meetings: Not on file  . Marital Status: Not on file  Intimate Partner Violence:   . Fear of Current or Ex-Partner: Not on file  . Emotionally Abused: Not on file  . Physically Abused: Not on file  . Sexually Abused: Not on file    FAMILY HISTORY: Family History  Problem Relation Age of Onset  . Lung disease Mother   . Breast cancer Paternal Aunt 86       x 2 (first time late 27s), same breast  . Prostate cancer Paternal Uncle 25  . Bone cancer Paternal Uncle 93  . Brain cancer Paternal Uncle 50  . Lung cancer Paternal Uncle 52  . Asthma Father     ALLERGIES:  is allergic to nsaids; aspirin; and citrus.  MEDICATIONS:  Current Outpatient Medications  Medication Sig Dispense Refill  . acetaminophen (TYLENOL ARTHRITIS PAIN) 650 MG CR tablet Take 650 mg by mouth 2 (two) times daily as needed for pain.    Marland Kitchen albuterol (PROVENTIL HFA;VENTOLIN HFA) 108 (90 Base) MCG/ACT inhaler Inhale 1-2 puffs into the lungs every 6 (six) hours as needed for wheezing or shortness of breath. 1 Inhaler 0  . buPROPion (WELLBUTRIN SR) 150 MG 12 hr tablet Take 150 mg by mouth daily.     . Calcium Carbonate-Vitamin D (CALCIUM + D PO) Take 1 tablet by mouth daily.    . carboxymethylcellulose  (REFRESH TEARS) 0.5 % SOLN Apply to eye.    . Cetirizine HCl 10 MG CAPS Take 1 capsule by mouth as needed. Seasonal allergies    . ELDERBERRY PO Take by mouth.    . eltrombopag (PROMACTA) 50 MG tablet TAKE 1 TABLET BY MOUTH  EVERY OTHER DAY ON AN EMPTY STOMACH 1 HOUR BEFORE OR 2  HOURS AFTER A MEAL 30 tablet 6  . furosemide (LASIX) 40 MG tablet Take 40 mg by mouth daily.     . Melatonin 10 MG CAPS Take 10 mg by mouth at bedtime as needed.     . Multiple Vitamin (MULTIVITAMIN WITH MINERALS) TABS tablet Take 1 tablet by mouth daily.    . Naphazoline HCl (CLEAR EYES OP) Place 1 drop into both eyes daily.    Marland Kitchen  NON FORMULARY Use as directed 1 scoop in the mouth or throat daily. IT WORKS! (The Greens) pt mixes in water or fruit juice    . Vitamin D, Ergocalciferol, (DRISDOL) 1.25 MG (50000 UT) CAPS capsule TAKE 1 CAPSULE BY MOUTH ONE TIME PER WEEK 4 capsule 5   No current facility-administered medications for this visit.      Marland Kitchen  PHYSICAL EXAMINATION: ECOG PERFORMANCE STATUS: 0 - Asymptomatic  Vitals:   08/08/19 1021  BP: (!) 147/82  Pulse: 81  Resp: 16  Temp: (!) 96.4 F (35.8 C)  SpO2: 99%   Filed Weights   08/08/19 1021  Weight: 242 lb (109.8 kg)    Physical Exam  Constitutional: She is oriented to person, place, and time and well-developed, well-nourished, and in no distress.  HENT:  Head: Normocephalic and atraumatic.  Mouth/Throat: Oropharynx is clear and moist. No oropharyngeal exudate.  Eyes: Pupils are equal, round, and reactive to light.  Cardiovascular: Normal rate and regular rhythm.  Pulmonary/Chest: Effort normal and breath sounds normal. No respiratory distress. She has no wheezes.  Abdominal: Soft. Bowel sounds are normal. She exhibits no distension and no mass. There is no abdominal tenderness. There is no rebound and no guarding.  Musculoskeletal:        General: No tenderness or edema. Normal range of motion.     Cervical back: Normal range of motion and neck  supple.  Neurological: She is alert and oriented to person, place, and time.  Skin: Skin is warm.  Psychiatric: Affect normal.     LABORATORY DATA:  I have reviewed the data as listed Lab Results  Component Value Date   WBC 5.0 08/08/2019   HGB 12.7 08/08/2019   HCT 38.4 08/08/2019   MCV 89.7 08/08/2019   PLT 78 (L) 08/08/2019   Recent Labs    08/09/18 1029 05/10/19 1006 08/08/19 1002  NA 143 136 141  K 3.6 3.7 3.7  CL 107 106 107  CO2 _0 GLUCOSE 99 113* 99  BUN _1 CREATININE 1.03* 1.24* 1.19*  CALCIUM 9.5 9.1 9.4  GFRNONAA 59* 47* 49*  GFRAA >60 55* 57*  PROT 7.1 7.1 7.2  ALBUMIN 3.9 4.0 4.0  AST _2 ALT 33 30 33  ALKPHOS 75 69 73  BILITOT 0.5 0.7 0.4    RADIOGRAPHIC STUDIES: I have personally reviewed the radiological images as listed and agreed with the findings in the report. No results found.  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 on Promacta since October 2016.  #Patient currently on Promacta 50 mg twice a week; platelet count is 78; stable.  Continue at current dose [joint pains]   #  "Multiple joints pain/back spasms"-on  vit D 50,0000/week. STABLE; improved on exercise.   DISPOSITION:    # cbc in 3 months #  follow up in 6 months-MD; labs cbc/cmp- Dr.B     All questions were answered. The patient knows to call the clinic with any problems, questions or concerns.     Cammie Sickle, MD 08/08/2019 12:33 PM

## 2019-10-18 ENCOUNTER — Telehealth: Payer: Self-pay | Admitting: *Deleted

## 2019-10-18 NOTE — Telephone Encounter (Signed)
Patient called asking if it would be alright to get the COVID vaccine. Please advise

## 2019-10-19 NOTE — Telephone Encounter (Signed)
Call returned to patient and advised of doctor response. 

## 2019-10-19 NOTE — Telephone Encounter (Signed)
Brenda- I am fine with patient getting the vaccine. I think the benefits of the vaccination outweigh the potential risks. Please inform pt. Thanks GB  

## 2019-10-20 ENCOUNTER — Other Ambulatory Visit: Payer: Self-pay | Admitting: Internal Medicine

## 2019-11-07 ENCOUNTER — Inpatient Hospital Stay: Payer: 59 | Attending: Hematology and Oncology

## 2019-11-07 DIAGNOSIS — D693 Immune thrombocytopenic purpura: Secondary | ICD-10-CM | POA: Diagnosis present

## 2019-11-07 LAB — CBC WITH DIFFERENTIAL/PLATELET
Abs Immature Granulocytes: 0.02 10*3/uL (ref 0.00–0.07)
Basophils Absolute: 0 10*3/uL (ref 0.0–0.1)
Basophils Relative: 1 %
Eosinophils Absolute: 0.2 10*3/uL (ref 0.0–0.5)
Eosinophils Relative: 4 %
HCT: 37.7 % (ref 36.0–46.0)
Hemoglobin: 12.7 g/dL (ref 12.0–15.0)
Immature Granulocytes: 0 %
Lymphocytes Relative: 28 %
Lymphs Abs: 1.4 10*3/uL (ref 0.7–4.0)
MCH: 29.7 pg (ref 26.0–34.0)
MCHC: 33.7 g/dL (ref 30.0–36.0)
MCV: 88.1 fL (ref 80.0–100.0)
Monocytes Absolute: 0.5 10*3/uL (ref 0.1–1.0)
Monocytes Relative: 9 %
Neutro Abs: 3 10*3/uL (ref 1.7–7.7)
Neutrophils Relative %: 58 %
Platelets: 100 10*3/uL — ABNORMAL LOW (ref 150–400)
RBC: 4.28 MIL/uL (ref 3.87–5.11)
RDW: 14.2 % (ref 11.5–15.5)
WBC: 5.1 10*3/uL (ref 4.0–10.5)
nRBC: 0 % (ref 0.0–0.2)

## 2019-12-02 ENCOUNTER — Telehealth: Payer: Self-pay | Admitting: *Deleted

## 2019-12-02 NOTE — Telephone Encounter (Signed)
Patient requests re-authorization of her PROMACTA.

## 2019-12-05 ENCOUNTER — Telehealth: Payer: Self-pay | Admitting: Pharmacy Technician

## 2019-12-05 NOTE — Telephone Encounter (Signed)
[name] Encounter  Prior Authorization for Val Riles has been approved.    PA# [id number] Effective dates: 12/05/19 through 06/05/20  Will inform [employer] of PA approval.  Oral Oncology Clinic will continue to follow.   Daine Floras [facility] Patient Advocate [facility] Phone [phone] Fax [phone] 12/05/2019 10:23 AM

## 2019-12-05 NOTE — Telephone Encounter (Signed)
Left patient a voicemail informing her of the approval and my phone number if she had any questions.

## 2019-12-05 NOTE — Telephone Encounter (Signed)
[name] Encounter  Received notification from [employer] that prior authorization for Promacta is required.  PA submitted on CoverMyMeds Key [id number]  Status is pending  Oral Oncology Clinic will continue to follow.  Daine Floras [facility] Pharm[facility] Patient Advocate [facility] Phone [phone] Fax [phone] 12/05/2019 10:22 AM

## 2019-12-06 MED ORDER — ELTROMBOPAG OLAMINE 50 MG PO TABS: ORAL_TABLET | ORAL | 6 refills | Status: DC

## 2019-12-06 NOTE — Telephone Encounter (Signed)
Promacta RF submitted to patient's pharmacy.

## 2019-12-17 ENCOUNTER — Ambulatory Visit: Payer: 59

## 2020-02-02 ENCOUNTER — Other Ambulatory Visit: Payer: Self-pay | Admitting: Internal Medicine

## 2020-02-06 ENCOUNTER — Inpatient Hospital Stay: Payer: 59 | Attending: Internal Medicine | Admitting: Internal Medicine

## 2020-02-06 ENCOUNTER — Ambulatory Visit: Payer: 59 | Admitting: Internal Medicine

## 2020-02-06 ENCOUNTER — Other Ambulatory Visit: Payer: Self-pay

## 2020-02-06 ENCOUNTER — Other Ambulatory Visit: Payer: 59

## 2020-02-06 ENCOUNTER — Inpatient Hospital Stay: Payer: 59

## 2020-02-06 DIAGNOSIS — Z79899 Other long term (current) drug therapy: Secondary | ICD-10-CM | POA: Diagnosis not present

## 2020-02-06 DIAGNOSIS — D693 Immune thrombocytopenic purpura: Secondary | ICD-10-CM | POA: Insufficient documentation

## 2020-02-06 LAB — CBC WITH DIFFERENTIAL/PLATELET
Abs Immature Granulocytes: 0.02 10*3/uL (ref 0.00–0.07)
Basophils Absolute: 0.1 10*3/uL (ref 0.0–0.1)
Basophils Relative: 1 %
Eosinophils Absolute: 0.3 10*3/uL (ref 0.0–0.5)
Eosinophils Relative: 4 %
HCT: 36.7 % (ref 36.0–46.0)
Hemoglobin: 12.8 g/dL (ref 12.0–15.0)
Immature Granulocytes: 0 %
Lymphocytes Relative: 27 %
Lymphs Abs: 1.8 10*3/uL (ref 0.7–4.0)
MCH: 30.6 pg (ref 26.0–34.0)
MCHC: 34.9 g/dL (ref 30.0–36.0)
MCV: 87.8 fL (ref 80.0–100.0)
Monocytes Absolute: 0.7 10*3/uL (ref 0.1–1.0)
Monocytes Relative: 11 %
Neutro Abs: 3.9 10*3/uL (ref 1.7–7.7)
Neutrophils Relative %: 57 %
Platelets: 87 10*3/uL — ABNORMAL LOW (ref 150–400)
RBC: 4.18 MIL/uL (ref 3.87–5.11)
RDW: 13.7 % (ref 11.5–15.5)
WBC: 6.7 10*3/uL (ref 4.0–10.5)
nRBC: 0 % (ref 0.0–0.2)

## 2020-02-06 LAB — COMPREHENSIVE METABOLIC PANEL
ALT: 31 U/L (ref 0–44)
AST: 30 U/L (ref 15–41)
Albumin: 4.1 g/dL (ref 3.5–5.0)
Alkaline Phosphatase: 72 U/L (ref 38–126)
Anion gap: 9 (ref 5–15)
BUN: 18 mg/dL (ref 8–23)
CO2: 27 mmol/L (ref 22–32)
Calcium: 9.2 mg/dL (ref 8.9–10.3)
Chloride: 103 mmol/L (ref 98–111)
Creatinine, Ser: 1.48 mg/dL — ABNORMAL HIGH (ref 0.44–1.00)
GFR calc Af Amer: 44 mL/min — ABNORMAL LOW (ref >60)
GFR calc non Af Amer: 38 mL/min — ABNORMAL LOW (ref >60)
Glucose, Bld: 98 mg/dL (ref 70–99)
Potassium: 4.1 mmol/L (ref 3.5–5.1)
Sodium: 139 mmol/L (ref 135–145)
Total Bilirubin: 0.6 mg/dL (ref 0.3–1.2)
Total Protein: 7.7 g/dL (ref 6.5–8.1)

## 2020-02-06 NOTE — Assessment & Plan Note (Addendum)
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 on Promacta since October 2016.  #Patient currently on Promacta 50 mg twice a week; platelet count is 87; STABLE.  Continue at current dose [joint pains]   #  "Multiple joints pain/back spasms"-on  vit D 50,0000/week. STABLE.  improved on exercise.   #  CKD- stage III- recommend follow up with PCP.   DISPOSITION:    # cbc in 3 months #  follow up in second week of dec 2021-MD; labs cbc/cmp- Dr.B

## 2020-02-12 NOTE — Progress Notes (Signed)
[place] NOTE  Patient Care Team: Ezequiel Kayser, MD as PCP - General (Internal Medicine)  CHIEF COMPLAINTS/PURPOSE OF CONSULTATION:  ITP   SUMMARY of HEMATOLOGIC HISTORY:   # 1990 ITP- s/p Splenectomy x 2 [1991]; VCR [1998]; Win Rho; Rituxan x4 [2003]; N-Plate- responsive [2010-2012]; Steroid Responsive/Intolerant; OCT 2016- START PROMACTA 37m; then 53mqOD; July 2019- HOLD Promacta sec to JOINT PAIN; AUG 2019- re-started as platelets -52; OCT 2019-Promacta twice a week   Oncology History   No history exists.     HISTORY OF PRESENTING ILLNESS:  Victoria Morgan [age].o.  female  African-American female patient with a history of chronic refractory ITP currently on Promacta 50 mg twice a week is here for follow-up.   Continues to have intermittent joint pains.  No easy bruising or bleeding.  No nausea no vomiting.  Denies any headaches.  No falls.   Review of Systems  Constitutional: Negative for chills, diaphoresis, fever, malaise/fatigue and weight loss.  HENT: Negative for nosebleeds and sore throat.   Eyes: Negative for double vision.  Respiratory: Negative for cough, hemoptysis, sputum production, shortness of breath and wheezing.   Cardiovascular: Negative for chest pain, palpitations, orthopnea and leg swelling.  Gastrointestinal: Negative for abdominal pain, blood in stool, constipation, diarrhea, heartburn, melena, nausea and vomiting.  Genitourinary: Negative for dysuria, frequency and urgency.  Musculoskeletal: Positive for back pain and joint pain.  Skin: Negative.  Negative for itching and rash.  Neurological: Negative for dizziness, tingling, focal weakness, weakness and headaches.  Endo/Heme/Allergies: Does not bruise/bleed easily.  Psychiatric/Behavioral: Negative for depression. The patient is not nervous/anxious and does not have insomnia.      MEDICAL HISTORY:  Past Medical History:  Diagnosis Date  . Asthma   . Blood  dyscrasia    ITP   dx in 1990  . BRCA negative 08/2018   MyRisk neg except AXIN2/RNF43 VUS  . Family history of breast cancer 08/2018   IBIS=19.9%  . History of arthritis    in hips; now resolved after hip surgery  . ITP (idiopathic thrombocytopenic purpura) 05/01/2015    SURGICAL HISTORY: Past Surgical History:  Procedure Laterality Date  . COLONOSCOPY  09/18/2008   Dr. ElVira Agar. COLONOSCOPY WITH PROPOFOL N/A 02/23/2019   Procedure: COLONOSCOPY WITH PROPOFOL;  Surgeon: Toledo, TeBenay PikeMD;  Location: [facility] ENDOSCOPY;  Service: Endoscopy;  Laterality: N/A;  . HAND SURGERY     right hand  . spleenectomy  1991   x 2  . TOTAL HIP ARTHROPLASTY Right 11/28/2013   Procedure: TOTAL HIP ARTHROPLASTY ANTERIOR APPROACH;  Surgeon: JoAlta CorningMD;  [facility];  Laterality: Right;  Right total hip arthroplasty anterior approach  . TOTAL HIP ARTHROPLASTY Left 05/15/2014   Procedure: TOTAL HIP ARTHROPLASTY ANTERIOR APPROACH;  Surgeon: JoAlta CorningMD;  [facility]: [facility];  Laterality: Left;  . TUBAL LIGATION      SOCIAL HISTORY: Social History   Socioeconomic History  . Marital status: Married    Spouse name: Not on file  . Number of children: Not on file  . Years of education: Not on file  . Highest education level: Not on file  Occupational History  . Not on file  Tobacco Use  . Smoking status: Never Smoker  . Smokeless tobacco: Never Used  Vaping Use  . Vaping Use: Never used  Substance and Sexual Activity  . Alcohol use: Not Currently  Alcohol/week: 0.0 standard drinks    Comment: occasionally  . Drug use: No  . Sexual activity: Yes    Partners: Male    Birth control/protection: Post-menopausal  Other Topics Concern  . Not on file  Social History Narrative  . Not on file   Social Determinants of Health   Financial Resource Strain:   . Difficulty of Paying Living Expenses:   Food Insecurity:   . Worried About  Charity fundraiser in the Last Year:   . Arboriculturist in the Last Year:   Transportation Needs:   . Film/video editor (Medical):   Marland Kitchen Lack of Transportation (Non-Medical):   Physical Activity:   . Days of Exercise per Week:   . Minutes of Exercise per Session:   Stress:   . Feeling of Stress :   Social Connections:   . Frequency of Communication with Friends and Family:   . Frequency of Social Gatherings with Friends and Family:   . Attends Religious Services:   . Active Member of Clubs or Organizations:   . Attends Archivist Meetings:   Marland Kitchen Marital Status:   Intimate Partner Violence:   . Fear of Current or Ex-Partner:   . Emotionally Abused:   Marland Kitchen Physically Abused:   . Sexually Abused:     FAMILY HISTORY: Family History  Problem Relation Age of Onset  . Lung disease Mother   . Breast cancer Paternal Aunt 65       x 2 (first time late 98s), same breast  . Prostate cancer Paternal Uncle 53  . Bone cancer Paternal Uncle 30  . Brain cancer Paternal Uncle 78  . Lung cancer Paternal Uncle 84  . Asthma Father     ALLERGIES:  is allergic to nsaids, aspirin, and citrus.  MEDICATIONS:  Current Outpatient Medications  Medication Sig Dispense Refill  . acetaminophen (TYLENOL ARTHRITIS PAIN) 650 MG CR tablet Take 650 mg by mouth 2 (two) times daily as needed for pain.    Marland Kitchen albuterol (PROVENTIL HFA;VENTOLIN HFA) 108 (90 Base) MCG/ACT inhaler Inhale 1-2 puffs into the lungs every 6 (six) hours as needed for wheezing or shortness of breath. 1 Inhaler 0  . buPROPion (WELLBUTRIN SR) 150 MG 12 hr tablet Take 150 mg by mouth daily.     . Calcium Carbonate-Vitamin D (CALCIUM + D PO) Take 1 tablet by mouth daily.    . carboxymethylcellulose (REFRESH TEARS) 0.5 % SOLN Apply to eye.    . Cetirizine HCl 10 MG CAPS Take 1 capsule by mouth as needed. Seasonal allergies    . ELDERBERRY PO Take by mouth.    . eltrombopag (PROMACTA) 50 MG tablet TAKE 1 TABLET BY MOUTH  EVERY  OTHER DAY ON AN EMPTY STOMACH 1 HOUR BEFORE OR 2  HOURS AFTER A MEAL 30 tablet 6  . furosemide (LASIX) 40 MG tablet Take 40 mg by mouth daily.     . Melatonin 10 MG CAPS Take 10 mg by mouth at bedtime as needed.     . Multiple Vitamin (MULTIVITAMIN WITH MINERALS) TABS tablet Take 1 tablet by mouth daily.    . Naphazoline HCl (CLEAR EYES OP) Place 1 drop into both eyes daily.    . NON FORMULARY Use as directed 1 scoop in the mouth or throat daily. IT WORKS! (The Greens) pt mixes in water or fruit juice    . tiZANidine (ZANAFLEX) 2 MG tablet Take 2 mg by mouth 3 (three) times daily.    Marland Kitchen  Vitamin D, Ergocalciferol, (DRISDOL) 1.25 MG (50000 UNIT) CAPS capsule TAKE 1 CAPSULE BY MOUTH ONE TIME PER WEEK 12 capsule 1   No current facility-administered medications for this visit.      Marland Kitchen  PHYSICAL EXAMINATION: ECOG PERFORMANCE STATUS: 0 - Asymptomatic  Vitals:   02/06/20 1529  BP: 132/78  Pulse: 80  Temp: (!) 97.2 F (36.2 C)   Filed Weights   02/06/20 1529  Weight: 241 lb (109.3 kg)    Physical Exam HENT:     Head: Normocephalic and atraumatic.     Mouth/Throat:     Pharynx: No oropharyngeal exudate.  Eyes:     Pupils: Pupils are equal, round, and reactive to light.  Cardiovascular:     Rate and Rhythm: Normal rate and regular rhythm.  Pulmonary:     Effort: Pulmonary effort is normal. No respiratory distress.     Breath sounds: Normal breath sounds. No wheezing.  Abdominal:     General: Bowel sounds are normal. There is no distension.     Palpations: Abdomen is soft. There is no mass.     Tenderness: There is no abdominal tenderness. There is no guarding or rebound.  Musculoskeletal:        General: No tenderness. Normal range of motion.     Cervical back: Normal range of motion and neck supple.  Skin:    General: Skin is warm.  Neurological:     Mental Status: She is alert and oriented to person, place, and time.  Psychiatric:        Mood and Affect: Affect normal.       LABORATORY DATA:  I have reviewed the data as listed Lab Results  Component Value Date   WBC 6.7 02/06/2020   HGB 12.8 02/06/2020   HCT 36.7 02/06/2020   MCV 87.8 02/06/2020   PLT 87 (L) 02/06/2020   Recent Labs    05/10/19 1006 08/08/19 1002 02/06/20 1521  NA 136 141 139  K 3.7 3.7 4.1  CL 106 107 103  CO2 _0 GLUCOSE 113* 99 98  BUN _1 CREATININE 1.24* 1.19* 1.48*  CALCIUM 9.1 9.4 9.2  GFRNONAA 47* 49* 38*  GFRAA 55* 57* 44*  PROT 7.1 7.2 7.7  ALBUMIN 4.0 4.0 4.1  AST _2 ALT 30 33 31  ALKPHOS 69 73 72  BILITOT 0.7 0.4 0.6    RADIOGRAPHIC STUDIES: I have personally reviewed the radiological images as listed and agreed with the findings in the report. No results found.  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 on Promacta since October 2016.  #Patient currently on Promacta 50 mg twice a week; platelet count is 87; STABLE.  Continue at current dose [joint pains]   #  "Multiple joints pain/back spasms"-on  vit D 50,0000/week. STABLE.  improved on exercise.   #  CKD- stage III- recommend follow up with PCP.   DISPOSITION:    # cbc in 3 months #  follow up in second week of dec 2021-MD; labs cbc/cmp- Dr.B     All questions were answered. The patient knows to call the clinic with any problems, questions or concerns.     Cammie Sickle, MD 02/12/2020 10:25 AM

## 2020-03-14 ENCOUNTER — Encounter: Payer: Self-pay | Admitting: Obstetrics and Gynecology

## 2020-03-14 NOTE — Progress Notes (Signed)
PCP: Ezequiel Kayser, MD   Chief Complaint  Patient presents with  . Gynecologic Exam    insomnia     HPI:      Ms. Victoria Morgan is a 64 y.o. No obstetric history on file. who LMP was No LMP recorded. Patient is postmenopausal., presents today for her annual examination.  Her menses are absent and she is postmenopausal. She does not have intermenstrual bleeding.  She has tolerable vasomotor sx.   Sex activity: single partner, contraception - post menopausal status. She does not have vaginal dryness. Uses lubricants prn.  Last Pap: July 01, 2016  Results were: no abnormalities /neg HPV DNA.  Hx of STDs: none  Last mammogram: 08/20/18 Results were normal; repeat in 12 months.  There is a FH of breast cancer in her pat aunt twice and prostate cancer in her pat uncle. There is no FH of ovarian cancer. The patient does do self-breast exams. IBIS=19.9%. MyRisk neg except AXIN2/RNF43 VUS 1/20  Colonoscopy: colonoscopy 7/20 with Dr. Alice Reichert, without abnormalities. Repeat due after 10 years.   Tobacco use: The patient denies current or previous tobacco use. Alcohol use: social drinker  No drug use Exercise: moderately active  She does get adequate calcium and Vitamin D in her diet.  Labs with PCP. Having trouble with insomnia. Some nights sleeps well, other nights she wakes up in night after a few hrs. Takes melatonin with relief sometimes. Doesn't want any sleep meds. Minimal caffeine use.    Past Medical History:  Diagnosis Date  . Asthma   . Blood dyscrasia    ITP   dx in 1990  . BRCA negative 08/2018   MyRisk neg except AXIN2/RNF43 VUS  . Family history of breast cancer 08/2018   IBIS=19.9%  . History of arthritis    in hips; now resolved after hip surgery  . 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 COLONOSCOPY  09/18/2008   Dr. Vira Agar  . COLONOSCOPY WITH PROPOFOL N/A 02/23/2019    Procedure: COLONOSCOPY WITH PROPOFOL;  Surgeon: Toledo, Benay Pike, MD;  Location: [facility];  Service: Endoscopy;  Laterality: N/A;  . HAND SURGERY     right hand  . spleenectomy  1991   x 2  . TOTAL HIP ARTHROPLASTY Right 11/28/2013   Procedure: TOTAL HIP ARTHROPLASTY ANTERIOR APPROACH;  Surgeon: Alta Corning, MD;  Location: [place];  Service: Orthopedics;  Laterality: Right;  Right total hip arthroplasty anterior approach  . TOTAL HIP ARTHROPLASTY Left 05/15/2014   Procedure: TOTAL HIP ARTHROPLASTY ANTERIOR APPROACH;  Surgeon: Alta Corning, MD;  Location: [place];  Service: Orthopedics;  Laterality: Left;  . TUBAL LIGATION      Family History  Problem Relation Age of Onset  . Lung disease Mother   . Breast cancer Paternal Aunt 44       x 2 (first time late 65s), same breast  . Prostate cancer Paternal Uncle 63  . Bone cancer Paternal Uncle 48  . Brain cancer Paternal Uncle 54  . Lung cancer Paternal Uncle 32  . Asthma Father     Social History   Socioeconomic History  . Marital status: Married    Spouse name: Not on file  . Number of children: Not on file  . Years of education: Not on file  . Highest education level: Not on file  Occupational History  . Not on file  Tobacco Use  . Smoking  status: Never Smoker  . Smokeless tobacco: Never Used  Vaping Use  . Vaping Use: Never used  Substance and Sexual Activity  . Alcohol use: Not Currently    Alcohol/week: 0.0 standard drinks    Comment: occasionally  . Drug use: No  . Sexual activity: Yes    Partners: Male    Birth control/protection: Post-menopausal  Other Topics Concern  . Not on file  Social History Narrative  . Not on file   Social Determinants of Health   Financial Resource Strain:   . Difficulty of Paying Living Expenses:   Food Insecurity:   . Worried About Charity fundraiser in the Last Year:   . Arboriculturist in the Last Year:   Transportation Needs:   . Film/video editor (Medical):   Marland Kitchen  Lack of Transportation (Non-Medical):   Physical Activity:   . Days of Exercise per Week:   . Minutes of Exercise per Session:   Stress:   . Feeling of Stress :   Social Connections:   . Frequency of Communication with Friends and Family:   . Frequency of Social Gatherings with Friends and Family:   . Attends Religious Services:   . Active Member of Clubs or Organizations:   . Attends Archivist Meetings:   Marland Kitchen Marital Status:   Intimate Partner Violence:   . Fear of Current or Ex-Partner:   . Emotionally Abused:   Marland Kitchen Physically Abused:   . Sexually Abused:     Current Meds  Medication Sig  . acetaminophen (TYLENOL ARTHRITIS PAIN) 650 MG CR tablet Take 650 mg by mouth 2 (two) times daily as needed for pain.  Marland Kitchen albuterol (PROVENTIL HFA;VENTOLIN HFA) 108 (90 Base) MCG/ACT inhaler Inhale 1-2 puffs into the lungs every 6 (six) hours as needed for wheezing or shortness of breath.  Marland Kitchen buPROPion (WELLBUTRIN SR) 150 MG 12 hr tablet Take 150 mg by mouth daily.   . Calcium Carbonate-Vitamin D (CALCIUM + D PO) Take 1 tablet by mouth daily.  . carboxymethylcellulose (REFRESH TEARS) 0.5 % SOLN Apply to eye.  . Cetirizine HCl 10 MG CAPS Take 1 capsule by mouth as needed. Seasonal allergies  . ELDERBERRY PO Take by mouth.  . eltrombopag (PROMACTA) 50 MG tablet TAKE 1 TABLET BY MOUTH  EVERY OTHER DAY ON AN EMPTY STOMACH 1 HOUR BEFORE OR 2  HOURS AFTER A MEAL  . furosemide (LASIX) 40 MG tablet Take 40 mg by mouth daily.   . Melatonin 10 MG CAPS Take 10 mg by mouth at bedtime as needed.   . Multiple Vitamin (MULTIVITAMIN WITH MINERALS) TABS tablet Take 1 tablet by mouth daily.  . Naphazoline HCl (CLEAR EYES OP) Place 1 drop into both eyes daily.  . NON FORMULARY Use as directed 1 scoop in the mouth or throat daily. IT WORKS! (The Greens) pt mixes in water or fruit juice  . tiZANidine (ZANAFLEX) 2 MG tablet Take 2 mg by mouth 3 (three) times daily.  . Vitamin D, Ergocalciferol, (DRISDOL) 1.25  MG (50000 UNIT) CAPS capsule TAKE 1 CAPSULE BY MOUTH ONE TIME PER WEEK      ROS:  Review of Systems  Constitutional: Negative for fatigue, fever and unexpected weight change.  Respiratory: Positive for wheezing. Negative for cough and shortness of breath.   Cardiovascular: Negative for chest pain, palpitations and leg swelling.  Gastrointestinal: Negative for blood in stool, constipation, diarrhea, nausea and vomiting.  Endocrine: Negative for cold intolerance, heat intolerance  and polyuria.  Genitourinary: Negative for dyspareunia, dysuria, flank pain, frequency, genital sores, hematuria, menstrual problem, pelvic pain, urgency, vaginal bleeding, vaginal discharge and vaginal pain.  Musculoskeletal: Positive for arthralgias. Negative for back pain, joint swelling and myalgias.  Skin: Negative for rash.  Neurological: Negative for dizziness, syncope, light-headedness, numbness and headaches.  Hematological: Negative for adenopathy.  Psychiatric/Behavioral: Negative for agitation, confusion, sleep disturbance and suicidal ideas. The patient is not nervous/anxious.      Objective: BP 110/80   Ht _0  (1.651 m)   Wt 239 lb (108.4 kg)   BMI 39.77 kg/m    Physical Exam Constitutional:      Appearance: She is well-developed.  Genitourinary:     Vulva, vagina, cervix, uterus, right adnexa and left adnexa normal.     No vulval lesion or tenderness noted.     No vaginal discharge, erythema or tenderness.     No cervical polyp.     Uterus is not enlarged or tender.     No right or left adnexal mass present.     Right adnexa not tender.     Left adnexa not tender.  Neck:     Thyroid: No thyromegaly.  Cardiovascular:     Rate and Rhythm: Normal rate and regular rhythm.     Heart sounds: Normal heart sounds. No murmur heard.   Pulmonary:     Effort: Pulmonary effort is normal.     Breath sounds: Normal breath sounds.  Chest:     Breasts:        Right: No mass, nipple  discharge, skin change or tenderness.        Left: No mass, nipple discharge, skin change or tenderness.  Abdominal:     Palpations: Abdomen is soft.     Tenderness: There is no abdominal tenderness. There is no guarding.  Musculoskeletal:        General: Normal range of motion.     Cervical back: Normal range of motion.  Neurological:     General: No focal deficit present.     Mental Status: She is alert and oriented to person, place, and time.     Cranial Nerves: No cranial nerve deficit.  Skin:    General: Skin is warm and dry.  Psychiatric:        Mood and Affect: Mood normal.        Behavior: Behavior normal.        Thought Content: Thought content normal.        Judgment: Judgment normal.  Vitals reviewed.     Assessment/Plan:  Encounter for annual routine gynecological examination  Cervical cancer screening - Plan: Cytology - PAP  Screening for HPV (human papillomavirus) - Plan: Cytology - PAP  Encounter for screening mammogram for malignant neoplasm of breast - Plan: MM 3D SCREEN BREAST BILATERAL; pt to sched mammo  Insomnia--declines meds. Try sleep apps, essential oils. F/u with PCP prn.        GYN counsel breast self exam, mammography screening, menopause, adequate intake of calcium and vitamin D, diet and exercise    F/U  Return in about 1 year (around 03/15/2021).  Tehila Sokolow B. Matraca Hunkins, PA-C 03/15/2020 11:09 AM

## 2020-03-15 ENCOUNTER — Other Ambulatory Visit: Payer: Self-pay

## 2020-03-15 ENCOUNTER — Ambulatory Visit (INDEPENDENT_AMBULATORY_CARE_PROVIDER_SITE_OTHER): Payer: 59 | Admitting: Obstetrics and Gynecology

## 2020-03-15 ENCOUNTER — Encounter: Payer: Self-pay | Admitting: Obstetrics and Gynecology

## 2020-03-15 ENCOUNTER — Other Ambulatory Visit (HOSPITAL_COMMUNITY)
Admission: RE | Admit: 2020-03-15 | Discharge: 2020-03-15 | Disposition: A | Discharge status: Home / Self Care | Payer: 59 | Source: Ambulatory Visit | Attending: Obstetrics and Gynecology | Admitting: Obstetrics and Gynecology

## 2020-03-15 VITALS — BP 110/80 | Ht 65.0 in | Wt 239.0 lb

## 2020-03-15 DIAGNOSIS — Z01419 Encounter for gynecological examination (general) (routine) without abnormal findings: Secondary | ICD-10-CM | POA: Diagnosis not present

## 2020-03-15 DIAGNOSIS — Z1151 Encounter for screening for human papillomavirus (HPV): Secondary | ICD-10-CM | POA: Insufficient documentation

## 2020-03-15 DIAGNOSIS — Z124 Encounter for screening for malignant neoplasm of cervix: Secondary | ICD-10-CM | POA: Diagnosis present

## 2020-03-15 DIAGNOSIS — Z1231 Encounter for screening mammogram for malignant neoplasm of breast: Secondary | ICD-10-CM

## 2020-03-15 NOTE — Patient Instructions (Addendum)
I value your feedback and entrusting us with your care. If you get a  patient survey, I would appreciate you taking the time to let us know about your experience today. Thank you! ° °As of July 21, 2019, your lab results will be released to your MyChart immediately, before I even have a chance to see them. Please give me time to review them and contact you if there are any abnormalities. Thank you for your patience.  ° °[facility]: [phone] ° ° ° °

## 2020-03-16 LAB — CYTOLOGY - PAP
Comment: NEGATIVE
Diagnosis: NEGATIVE
High risk HPV: NEGATIVE

## 2020-04-04 ENCOUNTER — Ambulatory Visit
Admission: RE | Admit: 2020-04-04 | Discharge: 2020-04-04 | Disposition: A | Discharge status: Home / Self Care | Payer: 59 | Source: Ambulatory Visit | Attending: Obstetrics and Gynecology | Admitting: Obstetrics and Gynecology

## 2020-04-04 ENCOUNTER — Other Ambulatory Visit: Payer: Self-pay

## 2020-04-04 DIAGNOSIS — Z1231 Encounter for screening mammogram for malignant neoplasm of breast: Secondary | ICD-10-CM | POA: Insufficient documentation

## 2020-04-06 ENCOUNTER — Encounter: Payer: Self-pay | Admitting: Obstetrics and Gynecology

## 2020-05-07 ENCOUNTER — Other Ambulatory Visit: Payer: Self-pay | Admitting: *Deleted

## 2020-05-07 DIAGNOSIS — D693 Immune thrombocytopenic purpura: Secondary | ICD-10-CM

## 2020-05-08 ENCOUNTER — Inpatient Hospital Stay: Payer: 59 | Attending: Hematology and Oncology

## 2020-05-08 ENCOUNTER — Other Ambulatory Visit: Payer: Self-pay

## 2020-05-08 ENCOUNTER — Other Ambulatory Visit: Payer: 59

## 2020-05-08 DIAGNOSIS — D693 Immune thrombocytopenic purpura: Secondary | ICD-10-CM | POA: Insufficient documentation

## 2020-05-08 LAB — CBC WITH DIFFERENTIAL/PLATELET
Abs Immature Granulocytes: 0.02 10*3/uL (ref 0.00–0.07)
Basophils Absolute: 0.1 10*3/uL (ref 0.0–0.1)
Basophils Relative: 1 %
Eosinophils Absolute: 0.2 10*3/uL (ref 0.0–0.5)
Eosinophils Relative: 4 %
HCT: 38.7 % (ref 36.0–46.0)
Hemoglobin: 13.3 g/dL (ref 12.0–15.0)
Immature Granulocytes: 0 %
Lymphocytes Relative: 34 %
Lymphs Abs: 2 10*3/uL (ref 0.7–4.0)
MCH: 30.4 pg (ref 26.0–34.0)
MCHC: 34.4 g/dL (ref 30.0–36.0)
MCV: 88.4 fL (ref 80.0–100.0)
Monocytes Absolute: 0.6 10*3/uL (ref 0.1–1.0)
Monocytes Relative: 10 %
Neutro Abs: 3 10*3/uL (ref 1.7–7.7)
Neutrophils Relative %: 51 %
Platelets: 113 10*3/uL — ABNORMAL LOW (ref 150–400)
RBC: 4.38 MIL/uL (ref 3.87–5.11)
RDW: 14 % (ref 11.5–15.5)
WBC: 5.8 10*3/uL (ref 4.0–10.5)
nRBC: 0 % (ref 0.0–0.2)

## 2020-07-02 ENCOUNTER — Telehealth: Payer: Self-pay | Admitting: *Deleted

## 2020-07-02 MED ORDER — ELTROMBOPAG OLAMINE 50 MG PO TABS
ORAL_TABLET | ORAL | 6 refills | Status: DC
Start: 2020-07-02 — End: 2020-07-23

## 2020-07-02 NOTE — Telephone Encounter (Signed)
RF request from [facility]- for patient's Promacta submitted to [facility] per mds orders

## 2020-07-11 ENCOUNTER — Other Ambulatory Visit: Payer: Self-pay | Admitting: Internal Medicine

## 2020-07-23 ENCOUNTER — Inpatient Hospital Stay (HOSPITAL_BASED_OUTPATIENT_CLINIC_OR_DEPARTMENT_OTHER): Payer: 59 | Admitting: Internal Medicine

## 2020-07-23 ENCOUNTER — Encounter: Payer: Self-pay | Admitting: Internal Medicine

## 2020-07-23 ENCOUNTER — Inpatient Hospital Stay: Payer: 59 | Attending: Internal Medicine

## 2020-07-23 DIAGNOSIS — D693 Immune thrombocytopenic purpura: Secondary | ICD-10-CM

## 2020-07-23 DIAGNOSIS — Z79899 Other long term (current) drug therapy: Secondary | ICD-10-CM | POA: Insufficient documentation

## 2020-07-23 LAB — CBC WITH DIFFERENTIAL/PLATELET
Abs Immature Granulocytes: 0.02 10*3/uL (ref 0.00–0.07)
Basophils Absolute: 0.1 10*3/uL (ref 0.0–0.1)
Basophils Relative: 1 %
Eosinophils Absolute: 0.3 10*3/uL (ref 0.0–0.5)
Eosinophils Relative: 4 %
HCT: 37 % (ref 36.0–46.0)
Hemoglobin: 12.9 g/dL (ref 12.0–15.0)
Immature Granulocytes: 0 %
Lymphocytes Relative: 26 %
Lymphs Abs: 1.8 10*3/uL (ref 0.7–4.0)
MCH: 30.4 pg (ref 26.0–34.0)
MCHC: 34.9 g/dL (ref 30.0–36.0)
MCV: 87.1 fL (ref 80.0–100.0)
Monocytes Absolute: 0.8 10*3/uL (ref 0.1–1.0)
Monocytes Relative: 11 %
Neutro Abs: 4 10*3/uL (ref 1.7–7.7)
Neutrophils Relative %: 58 %
Platelets: 141 10*3/uL — ABNORMAL LOW (ref 150–400)
RBC: 4.25 MIL/uL (ref 3.87–5.11)
RDW: 13.7 % (ref 11.5–15.5)
WBC: 6.9 10*3/uL (ref 4.0–10.5)
nRBC: 0 % (ref 0.0–0.2)

## 2020-07-23 LAB — COMPREHENSIVE METABOLIC PANEL
ALT: 36 U/L (ref 0–44)
AST: 33 U/L (ref 15–41)
Albumin: 4.4 g/dL (ref 3.5–5.0)
Alkaline Phosphatase: 67 U/L (ref 38–126)
Anion gap: 11 (ref 5–15)
BUN: 16 mg/dL (ref 8–23)
CO2: 26 mmol/L (ref 22–32)
Calcium: 9.5 mg/dL (ref 8.9–10.3)
Chloride: 102 mmol/L (ref 98–111)
Creatinine, Ser: 1.08 mg/dL — ABNORMAL HIGH (ref 0.44–1.00)
GFR, Estimated: 58 mL/min — ABNORMAL LOW (ref >60)
Glucose, Bld: 74 mg/dL (ref 70–99)
Potassium: 3.8 mmol/L (ref 3.5–5.1)
Sodium: 139 mmol/L (ref 135–145)
Total Bilirubin: 0.3 mg/dL (ref 0.3–1.2)
Total Protein: 7.8 g/dL (ref 6.5–8.1)

## 2020-07-23 MED ORDER — ELTROMBOPAG OLAMINE 50 MG PO TABS
ORAL_TABLET | ORAL | 6 refills | Status: DC
Start: 2020-07-23 — End: 2021-05-07

## 2020-07-23 NOTE — Progress Notes (Signed)
Patient here for follow up no concerns today. 

## 2020-07-23 NOTE — Assessment & Plan Note (Addendum)
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 on Promacta since October 2016.  #Patient currently on Promacta 50 mg twice a week; platelet count is 141; STABLE.  Continue at current dose [joint pains]   #  "Multiple joints pain/back spasms"-on  vit D 50,0000/week. STABLE.  improved on exercise.   #  CKD- stage III- recommend follow up with PCP.   DISPOSITION:    #  follow up in 6 months-MD; labs cbc/cmp- Dr.B

## 2020-07-23 NOTE — Progress Notes (Signed)
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     HISTORY OF PRESENTING ILLNESS:  Victoria Morgan [age].o.  female  African-American female patient with a history of chronic refractory ITP currently on Promacta 50 mg twice a week is here for follow-up.   Patient denies any easy bruising or bleeding.  Continues have intermittent joint pains.  No nausea no vomiting pain no headaches.  No falls.  Continues to have intermittent joint pains.  No easy bruising or bleeding.   Review of Systems  Constitutional: Negative for chills, diaphoresis, fever, malaise/fatigue and weight loss.  HENT: Negative for nosebleeds and sore throat.   Eyes: Negative for double vision.  Respiratory: Negative for cough, hemoptysis, sputum production, shortness of breath and wheezing.   Cardiovascular: Negative for chest pain, palpitations, orthopnea and leg swelling.  Gastrointestinal: Negative for abdominal pain, blood in stool, constipation, diarrhea, heartburn, melena, nausea and vomiting.  Genitourinary: Negative for dysuria, frequency and urgency.  Musculoskeletal: Positive for back pain and joint pain.  Skin: Negative.  Negative for itching and rash.  Neurological: Negative for dizziness, tingling, focal weakness, weakness and headaches.  Endo/Heme/Allergies: Does not bruise/bleed easily.  Psychiatric/Behavioral: Negative for depression. The patient is not nervous/anxious and does not have insomnia.       MEDICAL HISTORY:  Past Medical History:  Diagnosis Date  . Asthma   . Blood dyscrasia    ITP   dx in 1990  . BRCA negative 08/2018   MyRisk neg except AXIN2/RNF43 VUS  . Family history of breast cancer 08/2018   IBIS=19.9%  . History of arthritis    in hips; now resolved after hip surgery  . ITP (idiopathic thrombocytopenic purpura) 05/01/2015    SURGICAL HISTORY: Past Surgical History:  Procedure Laterality Date  . COLONOSCOPY  09/18/2008   Dr. ElVira Agar. COLONOSCOPY WITH PROPOFOL N/A 02/23/2019   Procedure: COLONOSCOPY WITH PROPOFOL;  Surgeon: Toledo, TeBenay PikeMD;  Location: [facility];  Service: Endoscopy;  Laterality: N/A;  . HAND SURGERY     right hand  . spleenectomy  1991   x 2  . TOTAL HIP ARTHROPLASTY Right 11/28/2013   Procedure: TOTAL HIP ARTHROPLASTY ANTERIOR APPROACH;  Surgeon: JoAlta CorningMD;  [place] [place];  Laterality: Right;  Right total hip arthroplasty anterior approach  . TOTAL HIP ARTHROPLASTY Left 05/15/2014   Procedure: TOTAL HIP ARTHROPLASTY ANTERIOR APPROACH;  Surgeon: JoAlta CorningMD;  [facility] [facility] [place]: [place];  Laterality: Left;  . TUBAL LIGATION      SOCIAL HISTORY: Social History   Socioeconomic History  . Marital status: Married    Spouse name: Not on file  . Number of children: Not on file  . Years of education: Not on file  . Highest education level: Not on file  Occupational History  . Not on file  Tobacco Use  . Smoking status: Never Smoker  . Smokeless tobacco: Never Used  Vaping Use  . Vaping Use: Never used  Substance and Sexual Activity  . Alcohol use: Not Currently    Alcohol/week: 0.0 standard drinks    Comment: occasionally  . Drug use: No  . Sexual activity: Yes    Partners: Male    Birth control/protection: Post-menopausal  Other Topics Concern  . Not on file  Social History Narrative  . Not on file   Social Determinants of Health   Financial Resource Strain:  Not on file  Food Insecurity: Not on file  Transportation Needs: Not on file  Physical Activity: Not on file  Stress: Not on file  Social Connections: Not on file  Intimate Partner Violence: Not on file    FAMILY HISTORY: Family History  Problem Relation Age of Onset  . Lung disease Mother   . Breast cancer Paternal Aunt 13       x 2 (first time late 51s), same breast  . Prostate cancer Paternal Uncle 50  . Bone cancer Paternal Uncle 35  . Brain cancer Paternal Uncle 73  . Lung cancer Paternal Uncle 54  . Asthma Father     ALLERGIES:  is allergic to nsaids, aspirin, and citrus.  MEDICATIONS:  Current Outpatient Medications  Medication Sig Dispense Refill  . acetaminophen (TYLENOL) 650 MG CR tablet Take 650 mg by mouth 2 (two) times daily as needed for pain.    Marland Kitchen albuterol (PROVENTIL HFA;VENTOLIN HFA) 108 (90 Base) MCG/ACT inhaler Inhale 1-2 puffs into the lungs every 6 (six) hours as needed for wheezing or shortness of breath. 1 Inhaler 0  . buPROPion (WELLBUTRIN SR) 150 MG 12 hr tablet Take 150 mg by mouth daily.     . Calcium Carbonate-Vitamin D (CALCIUM + D PO) Take 1 tablet by mouth daily.    . carboxymethylcellulose (REFRESH PLUS) 0.5 % SOLN Apply to eye.    . Cetirizine HCl 10 MG CAPS Take 1 capsule by mouth as needed. Seasonal allergies    . ELDERBERRY PO Take by mouth.    . furosemide (LASIX) 40 MG tablet Take 40 mg by mouth daily.     . Melatonin 10 MG CAPS Take 10 mg by mouth at bedtime as needed.     . Multiple Vitamin (MULTIVITAMIN WITH MINERALS) TABS tablet Take 1 tablet by mouth daily.    . Naphazoline HCl (CLEAR EYES OP) Place 1 drop into both eyes daily.    . NON FORMULARY Use as directed 1 scoop in the mouth or throat daily. IT WORKS! (The Greens) pt mixes in water or fruit juice    . tiZANidine (ZANAFLEX) 2 MG tablet Take 2 mg by mouth 3 (three) times daily.    . Vitamin D, Ergocalciferol, (DRISDOL) 1.25 MG (50000 UNIT) CAPS capsule TAKE 1 CAPSULE BY MOUTH  ONE TIME PER WEEK 12 capsule 1  . eltrombopag (PROMACTA) 50 MG tablet TAKE 1 TABLET BY MOUTH  EVERY  DAY ON AN EMPTY STOMACH 1 HOUR BEFORE OR 2  HOURS AFTER A MEAL 30 tablet 6   No current facility-administered medications for this visit.      Marland Kitchen  PHYSICAL EXAMINATION: ECOG PERFORMANCE STATUS: 0 - Asymptomatic  Vitals:   07/23/20 1515  BP: 117/62  Pulse: 81  Resp: 16  Temp: 98 F (36.7 C)   Filed Weights   07/23/20 1515  Weight: 236 lb 6.4 oz (107.2 kg)    Physical Exam HENT:     Head: Normocephalic and atraumatic.     Mouth/Throat:     Pharynx: No oropharyngeal exudate.  Eyes:  Pupils: Pupils are equal, round, and reactive to light.  Cardiovascular:     Rate and Rhythm: Normal rate and regular rhythm.  Pulmonary:     Effort: Pulmonary effort is normal. No respiratory distress.     Breath sounds: Normal breath sounds. No wheezing.  Abdominal:     General: Bowel sounds are normal. There is no distension.     Palpations: Abdomen is soft. There is no mass.     Tenderness: There is no abdominal tenderness. There is no guarding or rebound.  Musculoskeletal:        General: No tenderness. Normal range of motion.     Cervical back: Normal range of motion and neck supple.  Skin:    General: Skin is warm.  Neurological:     Mental Status: She is alert and oriented to person, place, and time.  Psychiatric:        Mood and Affect: Affect normal.      LABORATORY DATA:  I have reviewed the data as listed Lab Results  Component Value Date   WBC 6.9 07/23/2020   HGB 12.9 07/23/2020   HCT 37.0 07/23/2020   MCV 87.1 07/23/2020   PLT 141 (L) 07/23/2020   Recent Labs    08/08/19 1002 02/06/20 1521 07/23/20 1455  NA 141 139 139  K 3.7 4.1 3.8  CL 107 103 102  CO2 29 27 26   GLUCOSE 99 98 74  BUN 15 18 16   CREATININE 1.19* 1.48* 1.08*  CALCIUM 9.4 9.2 9.5  GFRNONAA 49* 38* 58*  GFRAA 57* 44*  --   PROT 7.2 7.7 7.8  ALBUMIN 4.0 4.1 4.4  AST 27 30 33  ALT  33 31 36  ALKPHOS 73 72 67  BILITOT 0.4 0.6 0.3    RADIOGRAPHIC STUDIES: I have personally reviewed the radiological images as listed and agreed with the findings in the report. No results found.  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 on Promacta since October 2016.  #Patient currently on Promacta 50 mg twice a week; platelet count is 141; STABLE.  Continue at current dose [joint pains]   #  "Multiple joints pain/back spasms"-on  vit D 50,0000/week. STABLE.  improved on exercise.   #  CKD- stage III- recommend follow up with PCP.   DISPOSITION:    #  follow up in 6 months-MD; labs cbc/cmp- Dr.B     All questions were answered. The patient knows to call the clinic with any problems, questions or concerns.     Cammie Sickle, MD 07/31/2020 10:59 PM

## 2020-10-18 ENCOUNTER — Telehealth: Payer: Self-pay | Admitting: *Deleted

## 2020-10-18 NOTE — Telephone Encounter (Signed)
Steward Drone, why has she been out of work? We have not written her out of work. Did she have a procedure?  Next follow-up is not until June. Has she had lab work from another office? Is she having any bruising or bleeding? She most likely would need to come in for a lab check if there is a concern.

## 2020-10-18 NOTE — Telephone Encounter (Signed)
Dr. B - please advise. 

## 2020-10-18 NOTE — Telephone Encounter (Signed)
I called and spoke with patient who said that she is not out of work, rather she has been working from home and they are now wanting her to work in office. She would like to continue to work from home due to the COVID and her blood condition.

## 2020-10-18 NOTE — Telephone Encounter (Signed)
Patient requesting a return call to discuss a letter she needs because she is supposed to return to work and she is worried about her platelet count

## 2020-10-19 NOTE — Telephone Encounter (Signed)
Per Dr. B he will not write a note for patient to remain out of work due to covid restrictions or ITP.

## 2020-10-19 NOTE — Telephone Encounter (Signed)
I left message on voice mail re physician response

## 2020-10-30 ENCOUNTER — Other Ambulatory Visit: Payer: Self-pay | Admitting: *Deleted

## 2020-10-30 ENCOUNTER — Telehealth: Payer: Self-pay | Admitting: Internal Medicine

## 2020-10-30 DIAGNOSIS — D693 Immune thrombocytopenic purpura: Secondary | ICD-10-CM

## 2020-10-30 NOTE — Telephone Encounter (Signed)
Dr. Leonard Schwartz - please advise. Patient is requesting for a lab/md apt with you to check her labs.  At her last phone call, she wanted a work note to allow her to work from home given the covid -19 precaution and her ITP. Brenda left vm to let her know that a letter would not be written.

## 2020-10-30 NOTE — Telephone Encounter (Signed)
Per Dr. Leonard Schwartz - please schedule lab/md apt- next available

## 2020-11-06 ENCOUNTER — Encounter: Payer: Self-pay | Admitting: Internal Medicine

## 2020-11-06 ENCOUNTER — Inpatient Hospital Stay (HOSPITAL_BASED_OUTPATIENT_CLINIC_OR_DEPARTMENT_OTHER): Payer: 59 | Admitting: Internal Medicine

## 2020-11-06 ENCOUNTER — Inpatient Hospital Stay: Payer: 59 | Attending: Internal Medicine

## 2020-11-06 VITALS — BP 141/79 | HR 72 | Temp 97.5°F | Resp 18 | Wt 237.0 lb

## 2020-11-06 DIAGNOSIS — Z79899 Other long term (current) drug therapy: Secondary | ICD-10-CM | POA: Insufficient documentation

## 2020-11-06 DIAGNOSIS — D693 Immune thrombocytopenic purpura: Secondary | ICD-10-CM | POA: Diagnosis present

## 2020-11-06 DIAGNOSIS — N183 Chronic kidney disease, stage 3 unspecified: Secondary | ICD-10-CM | POA: Insufficient documentation

## 2020-11-06 DIAGNOSIS — N189 Chronic kidney disease, unspecified: Secondary | ICD-10-CM | POA: Diagnosis not present

## 2020-11-06 LAB — COMPREHENSIVE METABOLIC PANEL
ALT: 32 U/L (ref 0–44)
AST: 29 U/L (ref 15–41)
Albumin: 4.2 g/dL (ref 3.5–5.0)
Alkaline Phosphatase: 61 U/L (ref 38–126)
Anion gap: 8 (ref 5–15)
BUN: 15 mg/dL (ref 8–23)
CO2: 27 mmol/L (ref 22–32)
Calcium: 9.4 mg/dL (ref 8.9–10.3)
Chloride: 102 mmol/L (ref 98–111)
Creatinine, Ser: 1.15 mg/dL — ABNORMAL HIGH (ref 0.44–1.00)
GFR, Estimated: 54 mL/min — ABNORMAL LOW (ref >60)
Glucose, Bld: 98 mg/dL (ref 70–99)
Potassium: 3.4 mmol/L — ABNORMAL LOW (ref 3.5–5.1)
Sodium: 137 mmol/L (ref 135–145)
Total Bilirubin: 0.7 mg/dL (ref 0.3–1.2)
Total Protein: 7.2 g/dL (ref 6.5–8.1)

## 2020-11-06 LAB — CBC WITH DIFFERENTIAL/PLATELET
Abs Immature Granulocytes: 0.01 10*3/uL (ref 0.00–0.07)
Basophils Absolute: 0.1 10*3/uL (ref 0.0–0.1)
Basophils Relative: 1 %
Eosinophils Absolute: 0.1 10*3/uL (ref 0.0–0.5)
Eosinophils Relative: 2 %
HCT: 36.5 % (ref 36.0–46.0)
Hemoglobin: 12.8 g/dL (ref 12.0–15.0)
Immature Granulocytes: 0 %
Lymphocytes Relative: 29 %
Lymphs Abs: 1.7 10*3/uL (ref 0.7–4.0)
MCH: 30.6 pg (ref 26.0–34.0)
MCHC: 35.1 g/dL (ref 30.0–36.0)
MCV: 87.3 fL (ref 80.0–100.0)
Monocytes Absolute: 0.6 10*3/uL (ref 0.1–1.0)
Monocytes Relative: 10 %
Neutro Abs: 3.4 10*3/uL (ref 1.7–7.7)
Neutrophils Relative %: 58 %
Platelets: 67 10*3/uL — ABNORMAL LOW (ref 150–400)
RBC: 4.18 MIL/uL (ref 3.87–5.11)
RDW: 13.8 % (ref 11.5–15.5)
WBC: 6 10*3/uL (ref 4.0–10.5)
nRBC: 0 % (ref 0.0–0.2)

## 2020-11-06 NOTE — Progress Notes (Signed)
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     HISTORY OF PRESENTING ILLNESS:  Victoria Morgan [age].o.  female  African-American female patient with a history of chronic refractory ITP currently on Promacta 50 mg twice a week is here for follow-up.   Patient denies any easy bruising.  Denies any blood in stools or black or stools.  She admits to compliance with her Promacta 50 mg twice a week [Mondays and Thursdays].  However on routine blood work she is concerned about her chronic kidney disease.  She is concerned if Promacta is causing her renal insufficiency.  She continues been Lasix for her intermittent swelling of the legs.  Review of Systems  Constitutional: Negative for chills, diaphoresis, fever, malaise/fatigue and weight loss.  HENT: Negative for nosebleeds and sore throat.   Eyes: Negative for double vision.  Respiratory: Negative for cough, hemoptysis, sputum production, shortness of breath and wheezing.   Cardiovascular: Negative for chest pain, palpitations, orthopnea and leg swelling.  Gastrointestinal: Negative for abdominal pain, blood in stool, constipation, diarrhea, heartburn, melena, nausea and vomiting.  Genitourinary: Negative for dysuria, frequency and urgency.  Musculoskeletal: Positive for back pain and joint pain.  Skin: Negative.  Negative for itching and rash.  Neurological: Negative for dizziness, tingling, focal weakness, weakness and headaches.   Endo/Heme/Allergies: Does not bruise/bleed easily.  Psychiatric/Behavioral: Negative for depression. The patient is not nervous/anxious and does not have insomnia.      MEDICAL HISTORY:  Past Medical History:  Diagnosis Date  . Asthma   . Blood dyscrasia    ITP   dx in 1990  . BRCA negative 08/2018   MyRisk neg except AXIN2/RNF43 VUS  . Family history of breast cancer 08/2018   IBIS=19.9%  . History of arthritis    in hips; now resolved after hip surgery  . ITP (idiopathic thrombocytopenic purpura) 05/01/2015    SURGICAL HISTORY: Past Surgical History:  Procedure Laterality Date  . COLONOSCOPY  09/18/2008   Dr. ElVira Agar. COLONOSCOPY WITH PROPOFOL N/A 02/23/2019   Procedure: COLONOSCOPY WITH PROPOFOL;  Surgeon: Toledo, TeBenay PikeMD;  Location: [facility] ENDOSCOPY;  Service: Endoscopy;  Laterality: N/A;  . HAND SURGERY     right hand  . spleenectomy  1991   x 2  . TOTAL HIP ARTHROPLASTY Right 11/28/2013   Procedure: TOTAL HIP ARTHROPLASTY ANTERIOR APPROACH;  Surgeon: JoAlta CorningMD;  [facility] Service: [facility]ics;  Laterality: Right;  Right total hip arthroplasty anterior approach  . TOTAL HIP ARTHROPLASTY Left 05/15/2014   Procedure: TOTAL HIP ARTHROPLASTY ANTERIOR APPROACH;  Surgeon: JoAlta CorningMD;  [facility] Service: [facility];  Laterality: Left;  . TUBAL LIGATION      SOCIAL HISTORY: Social History   Socioeconomic History  . Marital status: Married    Spouse name: Not on file  . Number of children: Not on file  . Years of education: Not on file  . Highest education level: Not on file  Occupational History  .  Not on file  Tobacco Use  . Smoking status: Never Smoker  . Smokeless tobacco: Never Used  Vaping Use  . Vaping Use: Never used  Substance and Sexual Activity  . Alcohol use: Not Currently    Alcohol/week: 0.0 standard drinks    Comment: occasionally  . Drug use: No  . Sexual activity: Yes    Partners: Male    Birth  control/protection: Post-menopausal  Other Topics Concern  . Not on file  Social History Narrative  . Not on file   Social Determinants of Health   Financial Resource Strain: Not on file  Food Insecurity: Not on file  Transportation Needs: Not on file  Physical Activity: Not on file  Stress: Not on file  Social Connections: Not on file  Intimate Partner Violence: Not on file    FAMILY HISTORY: Family History  Problem Relation Age of Onset  . Lung disease Mother   . Breast cancer Paternal Aunt 62       x 2 (first time late 79s), same breast  . Prostate cancer Paternal Uncle 87  . Bone cancer Paternal Uncle 4  . Brain cancer Paternal Uncle 56  . Lung cancer Paternal Uncle 63  . Asthma Father     ALLERGIES:  is allergic to nsaids, aspirin, and citrus.  MEDICATIONS:  Current Outpatient Medications  Medication Sig Dispense Refill  . acetaminophen (TYLENOL) 650 MG CR tablet Take 650 mg by mouth 2 (two) times daily as needed for pain.    Marland Kitchen albuterol (PROVENTIL HFA;VENTOLIN HFA) 108 (90 Base) MCG/ACT inhaler Inhale 1-2 puffs into the lungs every 6 (six) hours as needed for wheezing or shortness of breath. 1 Inhaler 0  . buPROPion (WELLBUTRIN SR) 150 MG 12 hr tablet Take 150 mg by mouth daily.     . Calcium Carbonate-Vitamin D (CALCIUM + D PO) Take 1 tablet by mouth daily.    . carboxymethylcellulose (REFRESH PLUS) 0.5 % SOLN Apply to eye.    . Cetirizine HCl 10 MG CAPS Take 1 capsule by mouth as needed. Seasonal allergies    . ELDERBERRY PO Take by mouth.    . eltrombopag (PROMACTA) 50 MG tablet TAKE 1 TABLET BY MOUTH  EVERY  DAY ON AN EMPTY STOMACH 1 HOUR BEFORE OR 2  HOURS AFTER A MEAL 30 tablet 6  . furosemide (LASIX) 40 MG tablet Take 40 mg by mouth daily.     Marland Kitchen gabapentin (NEURONTIN) 300 MG capsule Take 300 mg by mouth 3 (three) times daily.    . Melatonin 10 MG CAPS Take 10 mg by mouth at bedtime as needed.     . Multiple Vitamin (MULTIVITAMIN WITH MINERALS) TABS tablet  Take 1 tablet by mouth daily.    . Naphazoline HCl (CLEAR EYES OP) Place 1 drop into both eyes daily.    . NON FORMULARY Use as directed 1 scoop in the mouth or throat daily. IT WORKS! (The Greens) pt mixes in water or fruit juice    . pimecrolimus (ELIDEL) 1 % cream Apply topically.    . Vitamin D, Ergocalciferol, (DRISDOL) 1.25 MG (50000 UNIT) CAPS capsule TAKE 1 CAPSULE BY MOUTH ONE TIME PER WEEK 12 capsule 1  . tiZANidine (ZANAFLEX) 2 MG tablet Take 2 mg by mouth 3 (three) times daily. (Patient not taking: Reported on 11/06/2020)     No current facility-administered medications for this visit.      Marland Kitchen  PHYSICAL EXAMINATION: ECOG PERFORMANCE STATUS: 0 - Asymptomatic  Vitals:  11/06/20 1452  BP: (!) 141/79  Pulse: 72  Resp: 18  Temp: (!) 97.5 F (36.4 C)  SpO2: 100%   Filed Weights   11/06/20 1452  Weight: 237 lb (107.5 kg)    Physical Exam HENT:     Head: Normocephalic and atraumatic.     Mouth/Throat:     Pharynx: No oropharyngeal exudate.  Eyes:     Pupils: Pupils are equal, round, and reactive to light.  Cardiovascular:     Rate and Rhythm: Normal rate and regular rhythm.  Pulmonary:     Effort: Pulmonary effort is normal. No respiratory distress.     Breath sounds: Normal breath sounds. No wheezing.  Abdominal:     General: Bowel sounds are normal. There is no distension.     Palpations: Abdomen is soft. There is no mass.     Tenderness: There is no abdominal tenderness. There is no guarding or rebound.  Musculoskeletal:        General: No tenderness. Normal range of motion.     Cervical back: Normal range of motion and neck supple.  Skin:    General: Skin is warm.  Neurological:     Mental Status: She is alert and oriented to person, place, and time.  Psychiatric:        Mood and Affect: Affect normal.      LABORATORY DATA:  I have reviewed the data as listed Lab Results  Component Value Date   WBC 6.0 11/06/2020   HGB 12.8 11/06/2020   HCT 36.5  11/06/2020   MCV 87.3 11/06/2020   PLT 67 (L) 11/06/2020   Recent Labs    02/06/20 1521 07/23/20 1455 11/06/20 1427  NA 139 139 137  K 4.1 3.8 3.4*  CL 103 102 102  CO2 27 26 27   GLUCOSE 98 74 98  BUN 18 16 15   CREATININE 1.48* 1.08* 1.15*  CALCIUM 9.2 9.5 9.4  GFRNONAA 38* 58* 54*  GFRAA 44*  --   --   PROT 7.7 7.8 7.2  ALBUMIN 4.1 4.4 4.2  AST 30 33 29  ALT 31 36 32  ALKPHOS 72 67 61  BILITOT 0.6 0.3 0.7    RADIOGRAPHIC STUDIES: I have personally reviewed the radiological images as listed and agreed with the findings in the report. No results found.  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 on Promacta since October 2016.  #Patient currently on Promacta 50 mg twice a week; platelet count is  67-slightly low however STABLE;  Continue at current dose [joint pains].  Patient concerned about Promacta causing chronic insufficiency.  Reviewed the drug adverse events-no obvious concerns of renal failure with Promacta.  #  CKD- stage III- [GFR 54]-patient is very concerned about going on dialysis.  I did reiterate multiple times that her renal numbers do not merit dialysis.  Given the patient's anxiety concerns I would recommend evaluation with nephrology.defer to neprho re: kidney US /further work-up  DISPOSITION:   # refer to Nephrology re: Chronic kidney disease.   # Keep June appt- lab-cbc; cancel MD appt in june # follow up in 6 months-MD; labs cbc/cmp- Dr.B   Cc; Dr.Thies.     All questions were answered. The patient knows to call the clinic with any problems, questions or concerns.     Cammie Sickle, MD 11/06/2020 4:07 PM

## 2020-11-06 NOTE — Assessment & Plan Note (Addendum)
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 on Promacta since October 2016.  #Patient currently on Promacta 50 mg twice a week; platelet count is  67-slightly low however STABLE;  Continue at current dose [joint pains].  Patient concerned about Promacta causing chronic insufficiency.  Reviewed the drug adverse events-no obvious concerns of renal failure with Promacta.  #  CKD- stage III- [GFR 54]-patient is very concerned about going on dialysis.  I did reiterate multiple times that her renal numbers do not merit dialysis.  Given the patient's anxiety concerns I would recommend evaluation with nephrology.defer to neprho re: kidney US /further work-up  DISPOSITION:   # refer to Nephrology re: Chronic kidney disease.   # Keep June appt- lab-cbc; cancel MD appt in june # follow up in 6 months-MD; labs cbc/cmp- Dr.B   Cc; Dr.Thies.

## 2020-11-06 NOTE — Progress Notes (Signed)
Pt in for follow up reports she is very distraught that her kidney functions have worsened.

## 2020-11-28 ENCOUNTER — Other Ambulatory Visit: Payer: Self-pay | Admitting: Nephrology

## 2020-11-28 ENCOUNTER — Other Ambulatory Visit (HOSPITAL_COMMUNITY): Payer: Self-pay | Admitting: Nephrology

## 2020-11-28 DIAGNOSIS — R8290 Unspecified abnormal findings in urine: Secondary | ICD-10-CM

## 2020-11-28 DIAGNOSIS — D693 Immune thrombocytopenic purpura: Secondary | ICD-10-CM

## 2020-11-28 DIAGNOSIS — N1831 Chronic kidney disease, stage 3a: Secondary | ICD-10-CM

## 2020-11-28 DIAGNOSIS — I10 Essential (primary) hypertension: Secondary | ICD-10-CM

## 2020-11-29 ENCOUNTER — Other Ambulatory Visit: Payer: Self-pay | Admitting: *Deleted

## 2020-11-29 MED ORDER — VITAMIN D (ERGOCALCIFEROL) 1.25 MG (50000 UNIT) PO CAPS: ORAL_CAPSULE | ORAL | 1 refills | Status: DC

## 2020-12-25 ENCOUNTER — Ambulatory Visit
Admission: RE | Admit: 2020-12-25 | Discharge: 2020-12-25 | Disposition: A | Discharge status: Home / Self Care | Payer: 59 | Source: Ambulatory Visit | Attending: Nephrology | Admitting: Nephrology

## 2020-12-25 ENCOUNTER — Other Ambulatory Visit: Payer: Self-pay

## 2020-12-25 DIAGNOSIS — I10 Essential (primary) hypertension: Secondary | ICD-10-CM | POA: Insufficient documentation

## 2020-12-25 DIAGNOSIS — R8290 Unspecified abnormal findings in urine: Secondary | ICD-10-CM | POA: Insufficient documentation

## 2020-12-25 DIAGNOSIS — N1831 Chronic kidney disease, stage 3a: Secondary | ICD-10-CM | POA: Insufficient documentation

## 2020-12-25 DIAGNOSIS — D693 Immune thrombocytopenic purpura: Secondary | ICD-10-CM | POA: Diagnosis present

## 2021-01-10 ENCOUNTER — Other Ambulatory Visit: Payer: Self-pay | Admitting: Internal Medicine

## 2021-01-21 ENCOUNTER — Inpatient Hospital Stay: Payer: 59 | Attending: Internal Medicine

## 2021-01-21 ENCOUNTER — Other Ambulatory Visit: Payer: Self-pay

## 2021-01-21 ENCOUNTER — Ambulatory Visit: Payer: 59 | Admitting: Internal Medicine

## 2021-01-21 DIAGNOSIS — D693 Immune thrombocytopenic purpura: Secondary | ICD-10-CM | POA: Insufficient documentation

## 2021-01-21 LAB — CBC WITH DIFFERENTIAL/PLATELET
Abs Immature Granulocytes: 0.02 10*3/uL (ref 0.00–0.07)
Basophils Absolute: 0.1 10*3/uL (ref 0.0–0.1)
Basophils Relative: 1 %
Eosinophils Absolute: 0.4 10*3/uL (ref 0.0–0.5)
Eosinophils Relative: 8 %
HCT: 36.2 % (ref 36.0–46.0)
Hemoglobin: 12.6 g/dL (ref 12.0–15.0)
Immature Granulocytes: 0 %
Lymphocytes Relative: 28 %
Lymphs Abs: 1.6 10*3/uL (ref 0.7–4.0)
MCH: 30.7 pg (ref 26.0–34.0)
MCHC: 34.8 g/dL (ref 30.0–36.0)
MCV: 88.1 fL (ref 80.0–100.0)
Monocytes Absolute: 0.6 10*3/uL (ref 0.1–1.0)
Monocytes Relative: 11 %
Neutro Abs: 3 10*3/uL (ref 1.7–7.7)
Neutrophils Relative %: 52 %
Platelets: 153 10*3/uL (ref 150–400)
RBC: 4.11 MIL/uL (ref 3.87–5.11)
RDW: 13.6 % (ref 11.5–15.5)
WBC: 5.7 10*3/uL (ref 4.0–10.5)
nRBC: 0 % (ref 0.0–0.2)

## 2021-03-07 ENCOUNTER — Telehealth: Payer: Self-pay

## 2021-03-07 NOTE — Telephone Encounter (Signed)
Received notification from pharmacy that they can not get in touch with patient in regards to refill for Promacta. Looking at the last OV notes from 11/06/20 patient is still currently supposed to take Promacta 50 mg twice weekly (Monday and Thursday). I reached out to patient and LVM for her to contact me back to see what was going on. Just FYI.

## 2021-03-08 NOTE — Telephone Encounter (Signed)
Patient called stating that she still has 1-2 weeks of medicine on hand

## 2021-03-25 NOTE — Progress Notes (Signed)
PCP: Ezequiel Kayser, MD (Inactive)   Chief Complaint  Patient presents with   Gynecologic Exam   Urinary Tract Infection    Odor in mornings    HPI:      Victoria Morgan is a 65 y.o. No obstetric history on file. who LMP was No LMP recorded. Patient is postmenopausal., presents today for her annual examination.  Her menses are absent and she is postmenopausal. She does not have PMB. She has tolerable vasomotor sx, sometimes worse than others.   Sex activity: single partner, contraception - post menopausal status. She does have vaginal dryness. Uses lubricants prn with sx relief.  Last Pap: 03/15/20 Results were: no abnormalities /neg HPV DNA.  Hx of STDs: none  Last mammogram: 04/04/20 Results were normal; repeat in 12 months.  There is a FH of breast cancer in her pat aunt twice and prostate cancer in her pat uncle. There is no FH of ovarian cancer. The patient does do self-breast exams. IBIS=19.9%. MyRisk neg except AXIN2/RNF43 VUS 1/20  Colonoscopy: colonoscopy 7/20 with Dr. Alice Reichert, without abnormalities. Repeat due after 10 years.   Tobacco use: The patient denies current or previous tobacco use. Alcohol use: social drinker  No drug use Exercise: moderately active  She does get adequate calcium and Vitamin D in her diet.  Has urine odor in AM. Hx of stage 3A CKD, followed by nephrology with neg eval. Sx resolve as day goes on. Drinks lots of water. No other UTI sx.  Past Medical History:  Diagnosis Date   Asthma    Blood dyscrasia    ITP   dx in 1990   BRCA negative 08/2018   MyRisk neg except AXIN2/RNF43 VUS   Chronic kidney disease    followed [facility] history of breast cancer 08/2018   IBIS=19.9%   History of arthritis    in hips; now resolved after hip surgery   ITP (idiopathic thrombocytopenic purpura) 05/01/2015    Past Surgical History:  Procedure Laterality Date   COLONOSCOPY  09/18/2008   Dr. Vira Agar   COLONOSCOPY WITH PROPOFOL N/A  02/23/2019   Procedure: COLONOSCOPY WITH PROPOFOL;  Surgeon: Toledo, Benay Pike, MD;  Location: [facility] ENDOSCOPY;  Service: Endoscopy;  Laterality: N/A;   HAND SURGERY     right hand   spleenectomy  1991   x 2   TOTAL HIP ARTHROPLASTY Right 11/28/2013   Procedure: TOTAL HIP ARTHROPLASTY ANTERIOR APPROACH;  Surgeon: Alta Corning, MD;  Location: [facility];  Service: Orthopedics;  Laterality: Right;  Right total hip arthroplasty anterior approach   TOTAL HIP ARTHROPLASTY Left 05/15/2014   Procedure: TOTAL HIP ARTHROPLASTY ANTERIOR APPROACH;  Surgeon: Alta Corning, MD;  Location: [facility];  Service: Orthopedics;  Laterality: Left;   TUBAL LIGATION      Family History  Problem Relation Age of Onset   Lung disease Mother    Breast cancer Paternal Aunt 22       x 2 (first time late 64s), same breast   Prostate cancer Paternal Uncle 34   Bone cancer Paternal Uncle 78   Brain cancer Paternal Uncle 46   Lung cancer Paternal Uncle 76   Asthma Father     Social History   Socioeconomic History   Marital status: Married    Spouse name: Not on file   Number of children: Not on file   Years of education: Not on file   Highest education level: Not on file  Occupational  History   Not on file  Tobacco Use   Smoking status: Never   Smokeless tobacco: Never  Vaping Use   Vaping Use: Never used  Substance and Sexual Activity   Alcohol use: Not Currently    Alcohol/week: 0.0 standard drinks    Comment: occasionally   Drug use: No   Sexual activity: Yes    Partners: Male    Birth control/protection: Post-menopausal  Other Topics Concern   Not on file  Social History Narrative   Not on file   Social Determinants of Health   Financial Resource Strain: Not on file  Food Insecurity: Not on file  Transportation Needs: Not on file  Physical Activity: Not on file  Stress: Not on file  Social Connections: Not on file  Intimate Partner Violence: Not on file    Current Meds  Medication Sig    acetaminophen (TYLENOL) 650 MG CR tablet Take 650 mg by mouth 2 (two) times daily as needed for pain.   albuterol (PROVENTIL HFA;VENTOLIN HFA) 108 (90 Base) MCG/ACT inhaler Inhale 1-2 puffs into the lungs every 6 (six) hours as needed for wheezing or shortness of breath.   buPROPion (WELLBUTRIN SR) 150 MG 12 hr tablet Take 150 mg by mouth daily.    Calcium Carbonate-Vitamin D (CALCIUM + D PO) Take 1 tablet by mouth daily.   carboxymethylcellulose (REFRESH PLUS) 0.5 % SOLN Apply to eye.   Cetirizine HCl 10 MG CAPS Take 1 capsule by mouth as needed. Seasonal allergies   ELDERBERRY PO Take by mouth.   eltrombopag (PROMACTA) 50 MG tablet TAKE 1 TABLET BY MOUTH  EVERY  DAY ON AN EMPTY STOMACH 1 HOUR BEFORE OR 2  HOURS AFTER A MEAL   furosemide (LASIX) 40 MG tablet Take 40 mg by mouth daily.    Melatonin 10 MG CAPS Take 10 mg by mouth at bedtime as needed.    Multiple Vitamin (MULTIVITAMIN WITH MINERALS) TABS tablet Take 1 tablet by mouth daily.   Naphazoline HCl (CLEAR EYES OP) Place 1 drop into both eyes daily.   NON FORMULARY Use as directed 1 scoop in the mouth or throat daily. IT WORKS! (The Greens) pt mixes in water or fruit juice   Vitamin D, Ergocalciferol, (DRISDOL) 1.25 MG (50000 UNIT) CAPS capsule TAKE 1 CAPSULE BY MOUTH  ONCE WEEKLY      ROS:  Review of Systems  Constitutional:  Negative for fatigue, fever and unexpected weight change.  Respiratory:  Negative for cough, shortness of breath and wheezing.   Cardiovascular:  Negative for chest pain, palpitations and leg swelling.  Gastrointestinal:  Negative for blood in stool, constipation, diarrhea, nausea and vomiting.  Endocrine: Negative for cold intolerance, heat intolerance and polyuria.  Genitourinary:  Negative for dyspareunia, dysuria, flank pain, frequency, genital sores, hematuria, menstrual problem, pelvic pain, urgency, vaginal bleeding, vaginal discharge and vaginal pain.  Musculoskeletal:  Negative for arthralgias, back  pain, joint swelling and myalgias.  Skin:  Negative for rash.  Neurological:  Negative for dizziness, syncope, light-headedness, numbness and headaches.  Hematological:  Negative for adenopathy.  Psychiatric/Behavioral:  Negative for agitation, confusion, sleep disturbance and suicidal ideas. The patient is not nervous/anxious.     Objective: BP 140/90   Ht 5' 5"  (1.651 m)   Wt 237 lb (107.5 kg)   BMI 39.44 kg/m    Physical Exam Constitutional:      Appearance: She is well-developed.  Genitourinary:     Vulva normal.     Right Labia:  No rash, tenderness or lesions.    Left Labia: No tenderness, lesions or rash.    No vaginal discharge, erythema or tenderness.      Right Adnexa: not tender and no mass present.    Left Adnexa: not tender and no mass present.    No cervical friability or polyp.     Uterus is not enlarged or tender.  Breasts:    Right: No mass, nipple discharge, skin change or tenderness.     Left: No mass, nipple discharge, skin change or tenderness.  Neck:     Thyroid: No thyromegaly.  Cardiovascular:     Rate and Rhythm: Normal rate and regular rhythm.     Heart sounds: Normal heart sounds. No murmur heard. Pulmonary:     Effort: Pulmonary effort is normal.     Breath sounds: Normal breath sounds.  Abdominal:     Palpations: Abdomen is soft.     Tenderness: There is no abdominal tenderness. There is no guarding or rebound.  Musculoskeletal:        General: Normal range of motion.     Cervical back: Normal range of motion.  Lymphadenopathy:     Cervical: No cervical adenopathy.  Neurological:     General: No focal deficit present.     Mental Status: She is alert and oriented to person, place, and time.     Cranial Nerves: No cranial nerve deficit.  Skin:    General: Skin is warm and dry.  Psychiatric:        Mood and Affect: Mood normal.        Behavior: Behavior normal.        Thought Content: Thought content normal.        Judgment: Judgment  normal.  Vitals reviewed.   Results for orders placed or performed in visit on 03/26/21 (from the past 24 hour(s))  POCT Urinalysis Dipstick     Status: Abnormal   Collection Time: 03/26/21 10:46 AM  Result Value Ref Range   Color, UA yellow    Clarity, UA clear    Glucose, UA Negative Negative   Bilirubin, UA neg    Ketones, UA neg    Spec Grav, UA 1.020 1.010 - 1.025   Blood, UA neg    pH, UA 6.0 5.0 - 8.0   Protein, UA Positive (A) Negative   Urobilinogen, UA     Nitrite, UA neg    Leukocytes, UA Negative Negative   Appearance     Odor      Assessment/Plan: Encounter for annual routine gynecological examination  Encounter for screening mammogram for malignant neoplasm of breast - Plan: MM 3D SCREEN BREAST BILATERAL; pt to sched mammo  Abnormal urine odor - Plan: POCT Urinalysis Dipstick; neg UA. Hx of CKD, followed by nephrology. Sx in AM only, most likely food or med causing sx. Cont water intake         GYN counsel breast self exam, mammography screening, menopause, adequate intake of calcium and vitamin D, diet and exercise    F/U  Return in about 1 year (around 03/26/2022).  Nydia Ytuarte B. Blessed Girdner, PA-C 03/26/2021 10:47 AM

## 2021-03-26 ENCOUNTER — Other Ambulatory Visit: Payer: Self-pay

## 2021-03-26 ENCOUNTER — Ambulatory Visit (INDEPENDENT_AMBULATORY_CARE_PROVIDER_SITE_OTHER): Payer: 59 | Admitting: Obstetrics and Gynecology

## 2021-03-26 ENCOUNTER — Encounter: Payer: Self-pay | Admitting: Obstetrics and Gynecology

## 2021-03-26 VITALS — BP 140/90 | Ht 65.0 in | Wt 237.0 lb

## 2021-03-26 DIAGNOSIS — R8290 Unspecified abnormal findings in urine: Secondary | ICD-10-CM

## 2021-03-26 DIAGNOSIS — Z01419 Encounter for gynecological examination (general) (routine) without abnormal findings: Secondary | ICD-10-CM | POA: Diagnosis not present

## 2021-03-26 DIAGNOSIS — Z1231 Encounter for screening mammogram for malignant neoplasm of breast: Secondary | ICD-10-CM

## 2021-03-26 LAB — POCT URINALYSIS DIPSTICK
Bilirubin, UA: NEGATIVE
Blood, UA: NEGATIVE
Glucose, UA: NEGATIVE
Ketones, UA: NEGATIVE
Leukocytes, UA: NEGATIVE
Nitrite, UA: NEGATIVE
Protein, UA: POSITIVE — AB
Spec Grav, UA: 1.02 (ref 1.010–1.025)
pH, UA: 6 (ref 5.0–8.0)

## 2021-03-26 NOTE — Patient Instructions (Signed)
I value your feedback and you entrusting us with your care. If you get a [place] patient survey, I would appreciate you taking the time to let us know about your experience today. Thank you!  [facility]: [phone]      

## 2021-04-10 ENCOUNTER — Ambulatory Visit
Admission: RE | Admit: 2021-04-10 | Discharge: 2021-04-10 | Disposition: A | Discharge status: Home / Self Care | Payer: 59 | Source: Ambulatory Visit | Attending: Obstetrics and Gynecology | Admitting: Obstetrics and Gynecology

## 2021-04-10 ENCOUNTER — Other Ambulatory Visit: Payer: Self-pay

## 2021-04-10 DIAGNOSIS — Z1231 Encounter for screening mammogram for malignant neoplasm of breast: Secondary | ICD-10-CM | POA: Insufficient documentation

## 2021-05-02 ENCOUNTER — Other Ambulatory Visit: Payer: Self-pay | Admitting: *Deleted

## 2021-05-02 DIAGNOSIS — D693 Immune thrombocytopenic purpura: Secondary | ICD-10-CM

## 2021-05-07 ENCOUNTER — Inpatient Hospital Stay: Payer: 59 | Attending: Internal Medicine

## 2021-05-07 ENCOUNTER — Inpatient Hospital Stay (HOSPITAL_BASED_OUTPATIENT_CLINIC_OR_DEPARTMENT_OTHER): Payer: 59 | Admitting: Internal Medicine

## 2021-05-07 ENCOUNTER — Other Ambulatory Visit: Payer: 59

## 2021-05-07 ENCOUNTER — Other Ambulatory Visit: Payer: Self-pay

## 2021-05-07 ENCOUNTER — Ambulatory Visit: Payer: 59 | Admitting: Internal Medicine

## 2021-05-07 DIAGNOSIS — Z803 Family history of malignant neoplasm of breast: Secondary | ICD-10-CM | POA: Diagnosis not present

## 2021-05-07 DIAGNOSIS — Z808 Family history of malignant neoplasm of other organs or systems: Secondary | ICD-10-CM | POA: Diagnosis not present

## 2021-05-07 DIAGNOSIS — Z79899 Other long term (current) drug therapy: Secondary | ICD-10-CM | POA: Diagnosis not present

## 2021-05-07 DIAGNOSIS — D693 Immune thrombocytopenic purpura: Secondary | ICD-10-CM | POA: Diagnosis present

## 2021-05-07 DIAGNOSIS — Z801 Family history of malignant neoplasm of trachea, bronchus and lung: Secondary | ICD-10-CM | POA: Diagnosis not present

## 2021-05-07 DIAGNOSIS — N183 Chronic kidney disease, stage 3 unspecified: Secondary | ICD-10-CM | POA: Insufficient documentation

## 2021-05-07 DIAGNOSIS — Z8042 Family history of malignant neoplasm of prostate: Secondary | ICD-10-CM | POA: Insufficient documentation

## 2021-05-07 DIAGNOSIS — I890 Lymphedema, not elsewhere classified: Secondary | ICD-10-CM | POA: Insufficient documentation

## 2021-05-07 LAB — CBC WITH DIFFERENTIAL/PLATELET
Abs Immature Granulocytes: 0.01 10*3/uL (ref 0.00–0.07)
Basophils Absolute: 0.1 10*3/uL (ref 0.0–0.1)
Basophils Relative: 1 %
Eosinophils Absolute: 0.2 10*3/uL (ref 0.0–0.5)
Eosinophils Relative: 5 %
HCT: 36.4 % (ref 36.0–46.0)
Hemoglobin: 12.8 g/dL (ref 12.0–15.0)
Immature Granulocytes: 0 %
Lymphocytes Relative: 26 %
Lymphs Abs: 1.2 10*3/uL (ref 0.7–4.0)
MCH: 30.5 pg (ref 26.0–34.0)
MCHC: 35.2 g/dL (ref 30.0–36.0)
MCV: 86.9 fL (ref 80.0–100.0)
Monocytes Absolute: 0.3 10*3/uL (ref 0.1–1.0)
Monocytes Relative: 6 %
Neutro Abs: 2.9 10*3/uL (ref 1.7–7.7)
Neutrophils Relative %: 62 %
Platelets: 56 10*3/uL — ABNORMAL LOW (ref 150–400)
RBC: 4.19 MIL/uL (ref 3.87–5.11)
RDW: 13.9 % (ref 11.5–15.5)
WBC: 4.7 10*3/uL (ref 4.0–10.5)
nRBC: 0 % (ref 0.0–0.2)

## 2021-05-07 LAB — COMPREHENSIVE METABOLIC PANEL
ALT: 30 U/L (ref 0–44)
AST: 32 U/L (ref 15–41)
Albumin: 4.3 g/dL (ref 3.5–5.0)
Alkaline Phosphatase: 73 U/L (ref 38–126)
Anion gap: 5 (ref 5–15)
BUN: 16 mg/dL (ref 8–23)
CO2: 28 mmol/L (ref 22–32)
Calcium: 9.4 mg/dL (ref 8.9–10.3)
Chloride: 104 mmol/L (ref 98–111)
Creatinine, Ser: 1.34 mg/dL — ABNORMAL HIGH (ref 0.44–1.00)
GFR, Estimated: 45 mL/min — ABNORMAL LOW (ref >60)
Glucose, Bld: 95 mg/dL (ref 70–99)
Potassium: 4.1 mmol/L (ref 3.5–5.1)
Sodium: 137 mmol/L (ref 135–145)
Total Bilirubin: 0.8 mg/dL (ref 0.3–1.2)
Total Protein: 7.7 g/dL (ref 6.5–8.1)

## 2021-05-07 MED ORDER — ELTROMBOPAG OLAMINE 50 MG PO TABS
ORAL_TABLET | ORAL | 6 refills | Status: DC
Start: 2021-05-07 — End: 2022-05-29

## 2021-05-07 NOTE — Assessment & Plan Note (Addendum)
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 on Promacta since October 2016.  #Patient currently on Promacta 50 mg twice a week; platelet count is  56-slightly low however overa all stable.   Continue at current dose [joint pains].will repeat blood labs in 2 weeks; and increase the dose to thrice a week if needed.   #  CKD- stage III- [GFR 45]- STABLE [Dr.Korrapati]  # Lymphedema BIL LE [on lasix per nephro]; recommend compression stockings leg elevation.  * repeat labs- in 2 weeks- if still low- increase promatca/mychart  DISPOSITION:   # in 2 weeks- labs- cbc # follow up in 6 months-MD; labs cbc/cmp- Dr.B   Cc; Dr.Thies.

## 2021-05-07 NOTE — Progress Notes (Signed)
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     HISTORY OF PRESENTING ILLNESS:  Victoria Morgan [age].o.  female  African-American female patient with a history of chronic refractory ITP currently on Promacta 50 mg twice a week /CKD- III is here for follow-up.   Patient denies any easy bruising.  Denies any blood in stools or black or stools.  She admits to compliance with her Promacta 50 mg twice a week [Mondays and Thursdays].  In the interim eval by nephrology for chronic kidney disease.  Chronic mild swelling of the legs not any worse.   Review of Systems  Constitutional:  Negative for chills, diaphoresis, fever, malaise/fatigue and weight loss.  HENT:  Negative for nosebleeds and sore throat.   Eyes:  Negative for double vision.  Respiratory:  Negative for cough, hemoptysis, sputum production, shortness of breath and wheezing.   Cardiovascular:  Negative for chest pain, palpitations, orthopnea and leg swelling.  Gastrointestinal:  Negative for abdominal pain, blood in stool, constipation, diarrhea, heartburn, melena, nausea and vomiting.  Genitourinary:  Negative for dysuria, frequency and urgency.  Musculoskeletal:  Positive for back pain and joint pain.  Skin: Negative.  Negative for itching and rash.  Neurological:  Negative for dizziness, tingling, focal weakness, weakness and  headaches.  Endo/Heme/Allergies:  Does not bruise/bleed easily.  Psychiatric/Behavioral:  Negative for depression. The patient is not nervous/anxious and does not have insomnia.     MEDICAL HISTORY:  Past Medical History:  Diagnosis Date   Asthma    Blood dyscrasia    ITP   dx in 1990   BRCA negative 08/2018   MyRisk neg except AXIN2/RNF43 VUS   Chronic kidney disease    followed at [facility] Family history of breast cancer 08/2018   IBIS=19.9%   History of arthritis    in hips; now resolved after hip surgery   ITP (idiopathic thrombocytopenic purpura) 05/01/2015    SURGICAL HISTORY: Past Surgical History:  Procedure Laterality Date   COLONOSCOPY  09/18/2008   Dr. ElVira Agar COLONOSCOPY WITH PROPOFOL N/A 02/23/2019   Procedure: COLONOSCOPY WITH PROPOFOL;  Surgeon: Toledo, TeBenay PikeMD;  Location: [facility] ENDOSCOPY;  Service: Endoscopy;  Laterality: N/A;   HAND SURGERY     right hand   spleenectomy  1991   x 2   TOTAL HIP ARTHROPLASTY Right 11/28/2013   Procedure: TOTAL HIP ARTHROPLASTY ANTERIOR APPROACH;  Surgeon: JoAlta CorningMD;  [facility];  Laterality: Right;  Right total hip arthroplasty anterior approach   TOTAL HIP ARTHROPLASTY Left 05/15/2014   Procedure: TOTAL HIP ARTHROPLASTY ANTERIOR APPROACH;  Surgeon: JoAlta CorningMD;  [place] [facility]: [facility];  Laterality: Left;   TUBAL LIGATION      SOCIAL HISTORY: Social History   Socioeconomic History   Marital status: Married    Spouse name: Not on file   Number of children: Not on file  Years of education: Not on file   Highest education level: Not on file  Occupational History   Not on file  Tobacco Use   Smoking status: Never   Smokeless tobacco: Never  Vaping Use   Vaping Use: Never used  Substance and Sexual Activity   Alcohol use: Not Currently    Alcohol/week: 0.0 standard drinks    Comment: occasionally   Drug use: No   Sexual activity: Yes    Partners: Male     Birth control/protection: Post-menopausal  Other Topics Concern   Not on file  Social History Narrative   Not on file   Social Determinants of Health   Financial Resource Strain: Not on file  Food Insecurity: Not on file  Transportation Needs: Not on file  Physical Activity: Not on file  Stress: Not on file  Social Connections: Not on file  Intimate Partner Violence: Not on file    FAMILY HISTORY: Family History  Problem Relation Age of Onset   Lung disease Mother    Breast cancer Paternal Aunt 62       x 2 (first time late 30s), same breast   Prostate cancer Paternal Uncle 27   Bone cancer Paternal Uncle 77   Brain cancer Paternal Uncle 32   Lung cancer Paternal Uncle 70   Asthma Father     ALLERGIES:  is allergic to nsaids, aspirin, and citrus.  MEDICATIONS:  Current Outpatient Medications  Medication Sig Dispense Refill   acetaminophen (TYLENOL) 650 MG CR tablet Take 650 mg by mouth 2 (two) times daily as needed for pain.     buPROPion (WELLBUTRIN SR) 150 MG 12 hr tablet Take 150 mg by mouth daily.      Calcium Carbonate-Vitamin D (CALCIUM + D PO) Take 1 tablet by mouth daily.     carboxymethylcellulose (REFRESH PLUS) 0.5 % SOLN Apply to eye.     Cetirizine HCl 10 MG CAPS Take 1 capsule by mouth as needed. Seasonal allergies     ELDERBERRY PO Take by mouth.     furosemide (LASIX) 40 MG tablet Take 40 mg by mouth daily.      Melatonin 10 MG CAPS Take 10 mg by mouth at bedtime as needed.      Multiple Vitamin (MULTIVITAMIN WITH MINERALS) TABS tablet Take 1 tablet by mouth daily.     Naphazoline HCl (CLEAR EYES OP) Place 1 drop into both eyes daily.     NON FORMULARY Use as directed 1 scoop in the mouth or throat daily. IT WORKS! ([employer]) pt mixes in water or fruit juice     Vitamin D, Ergocalciferol, (DRISDOL) 1.25 MG (50000 UNIT) CAPS capsule TAKE 1 CAPSULE BY MOUTH  ONCE WEEKLY 13 capsule 3   albuterol (PROVENTIL HFA;VENTOLIN HFA) 108 (90 Base) MCG/ACT  inhaler Inhale 1-2 puffs into the lungs every 6 (six) hours as needed for wheezing or shortness of breath. (Patient not taking: Reported on 05/07/2021) 1 Inhaler 0   eltrombopag (PROMACTA) 50 MG tablet TAKE 1 TABLET BY MOUTH  EVERY  DAY ON AN EMPTY STOMACH 1 HOUR BEFORE OR 2  HOURS AFTER A MEAL 30 tablet 6   No current facility-administered medications for this visit.      Marland Kitchen  PHYSICAL EXAMINATION: ECOG PERFORMANCE STATUS: 0 - Asymptomatic  Vitals:   05/07/21 0958  BP: 134/84  Pulse: 70  Resp: 20  Temp: (!) 97.5 F (36.4 C)    Filed Weights   05/07/21 0958  Weight: 233  lb (105.7 kg)     Physical Exam HENT:     Head: Normocephalic and atraumatic.     Mouth/Throat:     Pharynx: No oropharyngeal exudate.  Eyes:     Pupils: Pupils are equal, round, and reactive to light.  Cardiovascular:     Rate and Rhythm: Normal rate and regular rhythm.  Pulmonary:     Effort: Pulmonary effort is normal. No respiratory distress.     Breath sounds: Normal breath sounds. No wheezing.  Abdominal:     General: Bowel sounds are normal. There is no distension.     Palpations: Abdomen is soft. There is no mass.     Tenderness: There is no abdominal tenderness. There is no guarding or rebound.  Musculoskeletal:        General: No tenderness. Normal range of motion.     Cervical back: Normal range of motion and neck supple.  Skin:    General: Skin is warm.  Neurological:     Mental Status: She is alert and oriented to person, place, and time.  Psychiatric:        Mood and Affect: Affect normal.     LABORATORY DATA:  I have reviewed the data as listed Lab Results  Component Value Date   WBC 4.7 05/07/2021   HGB 12.8 05/07/2021   HCT 36.4 05/07/2021   MCV 86.9 05/07/2021   PLT 56 (L) 05/07/2021   Recent Labs    07/23/20 1455 11/06/20 1427 05/07/21 0942  NA 139 137 137  K 3.8 3.4* 4.1  CL 102 102 104  CO2 26 27 28   GLUCOSE 74 98 95  BUN 16 15 16   CREATININE 1.08* 1.15*  1.34*  CALCIUM 9.5 9.4 9.4  GFRNONAA 58* 54* 45*  PROT 7.8 7.2 7.7  ALBUMIN 4.4 4.2 4.3  AST 33 29 32  ALT 36 32 30  ALKPHOS 67 61 73  BILITOT 0.3 0.7 0.8    RADIOGRAPHIC STUDIES: I have personally reviewed the radiological images as listed and agreed with the findings in the report. MM 3D SCREEN BREAST BILATERAL  Result Date: 04/13/2021 CLINICAL DATA:  Screening. EXAM: DIGITAL SCREENING BILATERAL MAMMOGRAM WITH TOMOSYNTHESIS AND CAD TECHNIQUE: Bilateral screening digital craniocaudal and mediolateral oblique mammograms were obtained. Bilateral screening digital breast tomosynthesis was performed. The images were evaluated with computer-aided detection. COMPARISON:  Previous exam(s). ACR Breast Density Category b: There are scattered areas of fibroglandular density. FINDINGS: There are no findings suspicious for malignancy. IMPRESSION: No mammographic evidence of malignancy. A result letter of this screening mammogram will be mailed directly to the patient. RECOMMENDATION: Screening mammogram in one year. (Code:SM-B-01Y) BI-RADS CATEGORY  1: Negative. Electronically Signed   By: Abelardo Diesel M.D.   On: 04/13/2021 22:45   ASSESSMENT & PLAN:   Idiopathic thrombocytopenic purpura (HCC) Chronic refractory ITP status post multiple lines of therapy; steroid responsive but intolerant. on Promacta since October 2016.  #Patient currently on Promacta 50 mg twice a week; platelet count is  56-slightly low however overa all stable.   Continue at current dose [joint pains].will repeat blood labs in 2 weeks; and increase the dose to thrice a week if needed.   #  CKD- stage III- [GFR 45]- STABLE [Dr.Korrapati]  # Lymphedema BIL LE [on lasix per nephro]; recommend compression stockings leg elevation.  * repeat labs- in 2 weeks- if still low- increase promatca/mychart  DISPOSITION:   # in 2 weeks- labs- cbc # follow up in 6 months-MD; labs cbc/cmp-  Dr.B   Cc; Dr.Thies.    All questions were  answered. The patient knows to call the clinic with any problems, questions or concerns.     Cammie Sickle, MD 05/07/2021 10:26 AM

## 2021-05-20 ENCOUNTER — Other Ambulatory Visit: Payer: Self-pay

## 2021-05-20 ENCOUNTER — Inpatient Hospital Stay: Payer: 59

## 2021-05-20 ENCOUNTER — Inpatient Hospital Stay: Payer: 59 | Attending: Internal Medicine | Admitting: Internal Medicine

## 2021-05-20 DIAGNOSIS — D693 Immune thrombocytopenic purpura: Secondary | ICD-10-CM | POA: Diagnosis not present

## 2021-05-20 DIAGNOSIS — Z23 Encounter for immunization: Secondary | ICD-10-CM | POA: Diagnosis not present

## 2021-05-20 DIAGNOSIS — N189 Chronic kidney disease, unspecified: Secondary | ICD-10-CM

## 2021-05-20 LAB — COMPREHENSIVE METABOLIC PANEL
ALT: 26 U/L (ref 0–44)
AST: 27 U/L (ref 15–41)
Albumin: 4.1 g/dL (ref 3.5–5.0)
Alkaline Phosphatase: 69 U/L (ref 38–126)
Anion gap: 6 (ref 5–15)
BUN: 16 mg/dL (ref 8–23)
CO2: 25 mmol/L (ref 22–32)
Calcium: 9.2 mg/dL (ref 8.9–10.3)
Chloride: 105 mmol/L (ref 98–111)
Creatinine, Ser: 1.11 mg/dL — ABNORMAL HIGH (ref 0.44–1.00)
GFR, Estimated: 56 mL/min — ABNORMAL LOW (ref >60)
Glucose, Bld: 91 mg/dL (ref 70–99)
Potassium: 3.6 mmol/L (ref 3.5–5.1)
Sodium: 136 mmol/L (ref 135–145)
Total Bilirubin: 0.3 mg/dL (ref 0.3–1.2)
Total Protein: 7.3 g/dL (ref 6.5–8.1)

## 2021-05-20 MED ORDER — INFLUENZA VAC SPLIT QUAD 0.5 ML IM SUSY: 0.5000 mL | PREFILLED_SYRINGE | Freq: Once | INTRAMUSCULAR | Status: DC

## 2021-05-20 MED ORDER — INFLUENZA VAC SPLIT QUAD 0.5 ML IM SUSY
0.5000 mL | PREFILLED_SYRINGE | Freq: Once | INTRAMUSCULAR | Status: AC
  Administered 2021-05-20: 0.5 mL via INTRAMUSCULAR
  Filled 2021-05-20: qty 0.5

## 2021-05-20 NOTE — Progress Notes (Signed)
Patient tolerated Flu vaccine injection well today, no concerns voiced. Patient discharged, stable.

## 2021-05-21 ENCOUNTER — Inpatient Hospital Stay: Payer: 59

## 2021-05-23 ENCOUNTER — Other Ambulatory Visit: Payer: Self-pay | Admitting: *Deleted

## 2021-05-23 ENCOUNTER — Telehealth: Payer: Self-pay | Admitting: *Deleted

## 2021-05-23 DIAGNOSIS — D693 Immune thrombocytopenic purpura: Secondary | ICD-10-CM

## 2021-05-23 NOTE — Telephone Encounter (Signed)
Left msg for patient to return my phone call. Lab order entered. Requested call back from patient.

## 2021-05-23 NOTE — Telephone Encounter (Signed)
-----   Message from Earna Coder, MD sent at 05/22/2021  6:11 PM EDT ----- Regarding: Labs Victoria Morgan- looks like I wanted a cbc ( not cmp) at last follow up - 2 weeks ago.   Please inform pt- and have her draw a cbc in 1 week- and we can advise on the dosing of her promacta.  Thanks, GB

## 2021-05-23 NOTE — Telephone Encounter (Signed)
Natalia Leatherwood- will you reach out to patient to discuss. Let us know what day she wants to come for her repeat lab.

## 2021-05-23 NOTE — Telephone Encounter (Signed)
Pt returned phone call. She states that she will come tomorrow. She stressed that this was an inconvenience for her as she would have to take off the day from work just to come get the labs. She can not do this next week. Pt voiced that she was upset that she had to have another blood draw. I apologized for the patient's negative patient experience. She agreed to come in the morning to draw her labs. Apt time given at 815.

## 2021-05-24 ENCOUNTER — Other Ambulatory Visit: Payer: Self-pay

## 2021-05-24 ENCOUNTER — Inpatient Hospital Stay: Payer: 59 | Admitting: Internal Medicine

## 2021-05-24 DIAGNOSIS — D693 Immune thrombocytopenic purpura: Secondary | ICD-10-CM | POA: Diagnosis not present

## 2021-05-24 LAB — CBC WITH DIFFERENTIAL/PLATELET
Abs Immature Granulocytes: 0.01 10*3/uL (ref 0.00–0.07)
Basophils Absolute: 0.1 10*3/uL (ref 0.0–0.1)
Basophils Relative: 1 %
Eosinophils Absolute: 0.4 10*3/uL (ref 0.0–0.5)
Eosinophils Relative: 5 %
HCT: 38.2 % (ref 36.0–46.0)
Hemoglobin: 13 g/dL (ref 12.0–15.0)
Immature Granulocytes: 0 %
Lymphocytes Relative: 24 %
Lymphs Abs: 1.6 10*3/uL (ref 0.7–4.0)
MCH: 30.1 pg (ref 26.0–34.0)
MCHC: 34 g/dL (ref 30.0–36.0)
MCV: 88.4 fL (ref 80.0–100.0)
Monocytes Absolute: 0.7 10*3/uL (ref 0.1–1.0)
Monocytes Relative: 10 %
Neutro Abs: 3.9 10*3/uL (ref 1.7–7.7)
Neutrophils Relative %: 60 %
Platelets: 105 10*3/uL — ABNORMAL LOW (ref 150–400)
RBC: 4.32 MIL/uL (ref 3.87–5.11)
RDW: 13.7 % (ref 11.5–15.5)
WBC: 6.6 10*3/uL (ref 4.0–10.5)
nRBC: 0 % (ref 0.0–0.2)

## 2021-07-08 ENCOUNTER — Telehealth: Payer: Self-pay | Admitting: *Deleted

## 2021-07-08 NOTE — Telephone Encounter (Signed)
Call returned to patient and got voice mail, left message to please contact doctor who diagnosed her with COVID for this letter

## 2021-07-08 NOTE — Telephone Encounter (Signed)
Patient called asking for a note for her work due to the fact that she has had COVID

## 2021-07-10 ENCOUNTER — Other Ambulatory Visit: Payer: Self-pay | Admitting: Internal Medicine

## 2021-07-26 ENCOUNTER — Telehealth: Payer: Self-pay

## 2021-07-26 NOTE — Telephone Encounter (Signed)
Message left on patient's voicemail that we received a fax notification from [facility] that they have attempted to contact patient in regards to refilling Promacta rx.  Requested patient to call [facility] at [phone] to discuss refill.

## 2021-07-29 ENCOUNTER — Telehealth: Payer: Self-pay | Admitting: *Deleted

## 2021-07-29 NOTE — Telephone Encounter (Signed)
Received incoming msg from cover mymeds that patient's promatca "claim was rejected at [facility]" reviewed chart and attempted a PA through covermymeds. I discovered that the Pt's insurance card (on the patient's chart) is [name].   (Note- different spelling than what is on pt's chart in epic.)   [facility] [name] Key: Ignatius Specking - PA Case ID: [id number] - Rx #: [id number]   This request has received a Favorable outcome.  Please note any additional information provided by [facility] at the bottom of your screen.

## 2021-10-28 ENCOUNTER — Telehealth: Payer: Self-pay | Admitting: *Deleted

## 2021-10-28 NOTE — Telephone Encounter (Signed)
Patient would like her 3/28 appointment changed to a later time if possible. ?

## 2021-10-28 NOTE — Telephone Encounter (Signed)
LVM for pt to come same day later time. Told to call back if not good. ?

## 2021-10-29 ENCOUNTER — Other Ambulatory Visit: Payer: Self-pay | Admitting: *Deleted

## 2021-10-29 DIAGNOSIS — D693 Immune thrombocytopenic purpura: Secondary | ICD-10-CM

## 2021-11-05 ENCOUNTER — Inpatient Hospital Stay: Payer: 59 | Attending: Nurse Practitioner

## 2021-11-05 ENCOUNTER — Other Ambulatory Visit: Payer: Self-pay

## 2021-11-05 ENCOUNTER — Ambulatory Visit: Payer: 59 | Admitting: Internal Medicine

## 2021-11-05 ENCOUNTER — Inpatient Hospital Stay (HOSPITAL_BASED_OUTPATIENT_CLINIC_OR_DEPARTMENT_OTHER): Payer: 59 | Admitting: Nurse Practitioner

## 2021-11-05 ENCOUNTER — Other Ambulatory Visit: Payer: 59

## 2021-11-05 ENCOUNTER — Encounter: Payer: Self-pay | Admitting: Nurse Practitioner

## 2021-11-05 VITALS — BP 136/72 | HR 81 | Temp 97.7°F | Resp 16 | Wt 228.0 lb

## 2021-11-05 DIAGNOSIS — D693 Immune thrombocytopenic purpura: Secondary | ICD-10-CM | POA: Insufficient documentation

## 2021-11-05 DIAGNOSIS — Z79899 Other long term (current) drug therapy: Secondary | ICD-10-CM | POA: Diagnosis not present

## 2021-11-05 LAB — COMPREHENSIVE METABOLIC PANEL
ALT: 29 U/L (ref 0–44)
AST: 31 U/L (ref 15–41)
Albumin: 4.3 g/dL (ref 3.5–5.0)
Alkaline Phosphatase: 69 U/L (ref 38–126)
Anion gap: 6 (ref 5–15)
BUN: 18 mg/dL (ref 8–23)
CO2: 27 mmol/L (ref 22–32)
Calcium: 9.5 mg/dL (ref 8.9–10.3)
Chloride: 103 mmol/L (ref 98–111)
Creatinine, Ser: 1.2 mg/dL — ABNORMAL HIGH (ref 0.44–1.00)
GFR, Estimated: 51 mL/min — ABNORMAL LOW (ref >60)
Glucose, Bld: 95 mg/dL (ref 70–99)
Potassium: 3.9 mmol/L (ref 3.5–5.1)
Sodium: 136 mmol/L (ref 135–145)
Total Bilirubin: 0.3 mg/dL (ref 0.3–1.2)
Total Protein: 7.5 g/dL (ref 6.5–8.1)

## 2021-11-05 LAB — CBC WITH DIFFERENTIAL/PLATELET
Abs Immature Granulocytes: 0.01 10*3/uL (ref 0.00–0.07)
Basophils Absolute: 0.1 10*3/uL (ref 0.0–0.1)
Basophils Relative: 1 %
Eosinophils Absolute: 1.2 10*3/uL — ABNORMAL HIGH (ref 0.0–0.5)
Eosinophils Relative: 17 %
HCT: 36.8 % (ref 36.0–46.0)
Hemoglobin: 12.7 g/dL (ref 12.0–15.0)
Immature Granulocytes: 0 %
Lymphocytes Relative: 30 %
Lymphs Abs: 2 10*3/uL (ref 0.7–4.0)
MCH: 30 pg (ref 26.0–34.0)
MCHC: 34.5 g/dL (ref 30.0–36.0)
MCV: 87 fL (ref 80.0–100.0)
Monocytes Absolute: 0.5 10*3/uL (ref 0.1–1.0)
Monocytes Relative: 8 %
Neutro Abs: 3.1 10*3/uL (ref 1.7–7.7)
Neutrophils Relative %: 44 %
Platelets: 88 10*3/uL — ABNORMAL LOW (ref 150–400)
RBC: 4.23 MIL/uL (ref 3.87–5.11)
RDW: 13.5 % (ref 11.5–15.5)
WBC: 6.9 10*3/uL (ref 4.0–10.5)
nRBC: 0 % (ref 0.0–0.2)

## 2021-11-05 NOTE — Progress Notes (Signed)
Pt in for follow up, denies any concerns today. 

## 2021-11-21 NOTE — Progress Notes (Signed)
?[facility] ?CONSULT NOTE ? ?Patient Care Team: ?Ezequiel Kayser, MD as PCP - General (Internal Medicine) ? ?CHIEF COMPLAINTS/PURPOSE OF CONSULTATION:  ITP ? ?SUMMARY of HEMATOLOGIC HISTORY:  ? 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 ?#CKD stage III [Dr.Korrapati]; chronic bilateral lower extremity lymphedema ? ? ?Oncology History  ? No history exists.  ? ? ? ?HISTORY OF PRESENTING ILLNESS:  ?Victoria Morgan [age].o.  female  African-American female patient with a history of chronic refractory ITP, currently on Promacta 50 mg twice a week/CKD- III is here for follow-up. She is interested in minimizing medications when possible. Overall feels well. She denies black or bloody stools. Reports continuing to tolerate promacta well (Takes on Mondays and Thursdays). She's been on wellbutrin for many years, says she was started on it for weight loss, would like to wean off.  ? ?Review of Systems  ?Constitutional:  Negative for chills, diaphoresis, fever, malaise/fatigue and weight loss.  ?HENT:  Negative for nosebleeds and sore throat.   ?Eyes:  Negative for double vision.  ?Respiratory:  Negative for cough, hemoptysis, sputum production, shortness of breath and wheezing.   ?Cardiovascular:  Negative for chest pain, palpitations, orthopnea and leg swelling.  ?Gastrointestinal:  Negative for abdominal pain, blood in stool, constipation, diarrhea, heartburn, melena, nausea and vomiting.  ?Genitourinary:  Negative for dysuria, frequency and urgency.  ?Musculoskeletal:  Positive for back pain and joint pain.  ?Skin: Negative.  Negative for itching and rash.  ?Neurological:  Negative for dizziness, tingling, focal weakness, weakness and  headaches.  ?Endo/Heme/Allergies:  Does not bruise/bleed easily.  ?Psychiatric/Behavioral:  Negative for depression. The patient is not nervous/anxious and does not have insomnia.    ? ?MEDICAL HISTORY:  ?Past Medical History:  ?Diagnosis Date  ? Asthma   ? Blood dyscrasia   ? ITP   dx in 1990  ? BRCA negative 08/2018  ? MyRisk neg except AXIN2/RNF43 VUS  ? Chronic kidney disease   ? [facility]? Family history of breast cancer 08/2018  ? IBIS=19.9%  ? History of arthritis   ? in hips; now resolved after hip surgery  ? ITP (idiopathic thrombocytopenic purpura) 05/01/2015  ? ? ?SURGICAL HISTORY: ?Past Surgical History:  ?Procedure Laterality Date  ? COLONOSCOPY  09/18/2008  ? Dr. ElVira Agar? COLONOSCOPY WITH PROPOFOL N/A 02/23/2019  ? Procedure: COLONOSCOPY WITH PROPOFOL;  Surgeon: Toledo, TeBenay PikeMD;  Location: [facility] ENDOSCOPY;  Service: Endoscopy;  Laterality: N/A;  ? HAND SURGERY    ? right hand  ? spleenectomy  1991  ? x 2  ? TOTAL HIP ARTHROPLASTY Right 11/28/2013  ? Procedure: TOTAL HIP ARTHROPLASTY ANTERIOR APPROACH;  Surgeon: JoAlta CorningMD;  [place] [facility]: [place]opedics;  Laterality: Right;  Right total hip arthroplasty anterior approach  ? TOTAL HIP ARTHROPLASTY Left 05/15/2014  ? Procedure: TOTAL HIP ARTHROPLASTY ANTERIOR APPROACH;  Surgeon: JoAlta CorningMD;  [place] [facility] Service: [place];  Laterality: Left;  ? TUBAL LIGATION    ? ? ?SOCIAL HISTORY: ?Social History  ? ?Socioeconomic History  ? Marital status: Married  ?  Spouse name: Not on file  ? Number of children: Not on file  ? Years of education: Not on file  ?  Highest education level: Not on file  ?Occupational History  ? Not on file  ?Tobacco Use  ? Smoking status: Never  ? Smokeless tobacco: Never  ?Vaping Use  ? Vaping Use: Never used  ?Substance and Sexual Activity  ? Alcohol use: Not Currently  ?  Alcohol/week: 0.0 standard drinks  ?  Comment: occasionally  ? Drug use: No  ? Sexual activity: Yes  ?  Partners: Male   ?  Birth control/protection: Post-menopausal  ?Other Topics Concern  ? Not on file  ?Social History Narrative  ? Not on file  ? ?Social Determinants of Health  ? ?Financial Resource Strain: Not on file  ?Food Insecurity: Not on file  ?Transportation Needs: Not on file  ?Physical Activity: Not on file  ?Stress: Not on file  ?Social Connections: Not on file  ?Intimate Partner Violence: Not on file  ? ? ?FAMILY HISTORY: ?Family History  ?Problem Relation Age of Onset  ? Lung disease Mother   ? Breast cancer Paternal Aunt 55  ?     x 2 (first time late 59s), same breast  ? Prostate cancer Paternal Uncle 38  ? Bone cancer Paternal Uncle 15  ? Brain cancer Paternal Uncle 52  ? Lung cancer Paternal Uncle 10  ? Asthma Father   ? ? ?ALLERGIES:  is allergic to nsaids, aspirin, and citrus. ? ?MEDICATIONS:  ?Current Outpatient Medications  ?Medication Sig Dispense Refill  ? acetaminophen (TYLENOL) 650 MG CR tablet Take 650 mg by mouth 2 (two) times daily as needed for pain.    ? buPROPion (WELLBUTRIN SR) 150 MG 12 hr tablet Take 150 mg by mouth daily.     ? Calcium Carbonate-Vitamin D (CALCIUM + D PO) Take 1 tablet by mouth daily.    ? carboxymethylcellulose (REFRESH PLUS) 0.5 % SOLN Apply to eye.    ? Cetirizine HCl 10 MG CAPS Take 1 capsule by mouth as needed. Seasonal allergies    ? ELDERBERRY PO Take by mouth.    ? eltrombopag (PROMACTA) 50 MG tablet TAKE 1 TABLET BY MOUTH  EVERY  DAY ON AN EMPTY STOMACH 1 HOUR BEFORE OR 2  HOURS AFTER A MEAL 30 tablet 6  ? furosemide (LASIX) 40 MG tablet Take 40 mg by mouth daily.     ? lovastatin (MEVACOR) 10 MG tablet Take 10 mg by mouth daily.    ? Melatonin 10 MG CAPS Take 10 mg by mouth at bedtime as needed.     ? Multiple Vitamin (MULTIVITAMIN WITH MINERALS) TABS tablet Take 1 tablet by mouth daily.    ? Naphazoline HCl (CLEAR EYES OP) Place 1 drop into both eyes daily.    ? NON FORMULARY Use as directed 1 scoop in the mouth or throat daily. IT WORKS! ([employer]) pt mixes in water  or fruit juice    ? Vitamin D, Ergocalciferol, (DRISDOL) 1.25 MG (50000 UNIT) CAPS capsule TAKE 1 CAPSULE BY MOUTH  ONCE WEEKLY 13 capsule 3  ? albuterol (PROVENTIL HFA;VENTOLIN HFA) 108 (90 Base) MCG/ACT inhaler Inhale 1-2 puffs into the lungs every 6 (six) hours as needed for wheezing or shortness of breath. (Patient not taking: Reported on 05/07/2021) 1 Inhaler 0  ? ?No current facility-administered medications for this visit.  ?. ? ?PHYSICAL EXAMINATION: ?ECOG PERFORMANCE STATUS: 0 - Asymptomatic ? ?Vitals:  ? 11/05/21 1544  ?BP: 136/72  ?Pulse: 81  ?Resp: 16  ?Temp: 97.7 ?F (36.5 ?C)  ? ? ?Filed Weights  ? 11/05/21 1544  ?  Weight: 228 lb (103.4 kg)  ? ? ? ?Physical Exam ?HENT:  ?   Head: Normocephalic and atraumatic.  ?   Mouth/Throat:  ?   Pharynx: No oropharyngeal exudate.  ?Eyes:  ?   Pupils: Pupils are equal, round, and reactive to light.  ?Cardiovascular:  ?   Rate and Rhythm: Normal rate and regular rhythm.  ?Pulmonary:  ?   Effort: Pulmonary effort is normal. No respiratory distress.  ?   Breath sounds: Normal breath sounds. No wheezing.  ?Abdominal:  ?   General: Bowel sounds are normal. There is no distension.  ?   Palpations: Abdomen is soft. There is no mass.  ?   Tenderness: There is no abdominal tenderness. There is no guarding or rebound.  ?Musculoskeletal:     ?   General: No tenderness. Normal range of motion.  ?   Cervical back: Normal range of motion and neck supple.  ?Skin: ?   General: Skin is warm.  ?Neurological:  ?   Mental Status: She is alert and oriented to person, place, and time.  ?Psychiatric:     ?   Mood and Affect: Affect normal.  ? ? ? ?LABORATORY DATA:  ?I have reviewed the data as listed ?Lab Results  ?Component Value Date  ? WBC 6.9 11/05/2021  ? HGB 12.7 11/05/2021  ? HCT 36.8 11/05/2021  ? MCV 87.0 11/05/2021  ? PLT 88 (L) 11/05/2021  ? ?Recent Labs  ?  05/07/21 ?0942 05/20/21 ?1510 11/05/21 ?1510  ?NA 137 136 136  ?K 4.1 3.6 3.9  ?CL 104 105 103  ?CO2 _0 ?GLUCOSE 95  91 95  ?BUN _1 ?CREATININE 1.34* 1.11* 1.20*  ?CALCIUM 9.4 9.2 9.5  ?GFRNONAA 45* 56* 51*  ?PROT 7.7 7.3 7.5  ?ALBUMIN 4.3 4.1 4.3  ?AST 32 27 31  ?ALT _2 ?ALKPHOS 45 62 56  ?BILITOT 0.8 0.3 0.3  ?

## 2021-12-11 ENCOUNTER — Other Ambulatory Visit: Payer: Self-pay | Admitting: Internal Medicine

## 2022-02-09 ENCOUNTER — Ambulatory Visit
Admission: EM | Admit: 2022-02-09 | Discharge: 2022-02-09 | Disposition: A | Discharge status: Home / Self Care | Payer: 59 | Attending: Emergency Medicine | Admitting: Emergency Medicine

## 2022-02-09 ENCOUNTER — Other Ambulatory Visit: Payer: Self-pay

## 2022-02-09 DIAGNOSIS — N189 Chronic kidney disease, unspecified: Secondary | ICD-10-CM | POA: Insufficient documentation

## 2022-02-09 DIAGNOSIS — Z886 Allergy status to analgesic agent status: Secondary | ICD-10-CM | POA: Insufficient documentation

## 2022-02-09 DIAGNOSIS — Z801 Family history of malignant neoplasm of trachea, bronchus and lung: Secondary | ICD-10-CM | POA: Diagnosis not present

## 2022-02-09 DIAGNOSIS — J069 Acute upper respiratory infection, unspecified: Secondary | ICD-10-CM | POA: Diagnosis present

## 2022-02-09 DIAGNOSIS — R0982 Postnasal drip: Secondary | ICD-10-CM | POA: Insufficient documentation

## 2022-02-09 DIAGNOSIS — R0602 Shortness of breath: Secondary | ICD-10-CM | POA: Diagnosis not present

## 2022-02-09 DIAGNOSIS — Z803 Family history of malignant neoplasm of breast: Secondary | ICD-10-CM | POA: Insufficient documentation

## 2022-02-09 DIAGNOSIS — Z9081 Acquired absence of spleen: Secondary | ICD-10-CM | POA: Diagnosis not present

## 2022-02-09 DIAGNOSIS — Z20822 Contact with and (suspected) exposure to covid-19: Secondary | ICD-10-CM | POA: Insufficient documentation

## 2022-02-09 DIAGNOSIS — J029 Acute pharyngitis, unspecified: Secondary | ICD-10-CM | POA: Insufficient documentation

## 2022-02-09 DIAGNOSIS — Z96643 Presence of artificial hip joint, bilateral: Secondary | ICD-10-CM | POA: Insufficient documentation

## 2022-02-09 DIAGNOSIS — Z79899 Other long term (current) drug therapy: Secondary | ICD-10-CM | POA: Diagnosis not present

## 2022-02-09 LAB — SARS CORONAVIRUS 2 BY RT PCR: SARS Coronavirus 2 by RT PCR: NEGATIVE

## 2022-02-09 LAB — GROUP A STREP BY PCR: Group A Strep by PCR: NOT DETECTED

## 2022-02-09 MED ORDER — FLUTICASONE PROPIONATE 50 MCG/ACT NA SUSP: 2.0000 | Freq: Every day | NASAL | 0 refills | Status: AC

## 2022-02-09 MED ORDER — ALBUTEROL SULFATE HFA 108 (90 BASE) MCG/ACT IN AERS: 1.0000 | INHALATION_SPRAY | RESPIRATORY_TRACT | 0 refills | Status: AC | PRN

## 2022-02-09 MED ORDER — AEROCHAMBER MV MISC: 1 refills | Status: AC

## 2022-02-09 NOTE — Discharge Instructions (Addendum)
I will contact you if and only if your COVID comes back positive.  Your strep PCR is negative.  If you do not hear from you by the end of the day, you can assume that it is negative.  2 puffs from your albuterol inhaler using your spacer every 4 hours for 2 days, then every 6 hours for 2 days, then as needed.  You can back off on the albuterol if you start to feel better.  Try Mucinex, saline nasal irrigation with a [employer]Med sinus rinse and distilled water as often as you want, Flonase for the nasal congestion and runny nose.  5 mL of children's liquid Benadryl mixed with 5 mL of Maalox 3-4 times a day.  This will help with your sore throat.

## 2022-02-09 NOTE — ED Triage Notes (Signed)
Pt c/o sore throat, pt stated it feels like mucous is stuck on the left side of her throat, can barely swallow when taking pills, no fever, fatigue, neg covid yesterday via home test, coughing up brown mucous

## 2022-02-09 NOTE — ED Provider Notes (Signed)
HPI  SUBJECTIVE:  Victoria Morgan is a 66 y.o. female who presents with 3 days of left-sided sore throat described as "something feels stuck" starting after eating some watermelon.  She reports rhinorrhea, postnasal drip, cough productive of brownish mucus.  No fevers, body aches, headaches, nasal congestion, wheezing, shortness of breath, nausea, vomiting, diarrhea, abdominal pain, rash.  No drooling, trismus, neck stiffness, voice changes, sensation of throat swelling shut, difficulty breathing.  No known COVID or strep exposure.  She got 4 doses of the COVID-vaccine.  She had a negative home COVID test.  She is able to sleep at night without waking up coughing.  No antibiotics in the past month.  No antipyretic in the past 6 hours.  She tried her albuterol 2-3 times yesterday, normally uses it as needed, with improvement in her cough.  She also tried Mucinex with improvement.  She has tried Zicam, TheraFlu.  Symptoms are worse with swallowing.  She has a past medical history of ITP, status post splenectomy, has a history of asthma, chronic kidney disease which is followed by nephrology.  No history of diabetes, hypertension.  PCP[facility].   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 Vira Agar   COLONOSCOPY WITH PROPOFOL N/A 02/23/2019   Procedure: COLONOSCOPY WITH PROPOFOL;  Surgeon: Toledo, Benay Pike, MD;  Location: [facility] ENDOSCOPY;  Service: Endoscopy;  Laterality: N/A;   HAND SURGERY     right hand   spleenectomy  1991   x 2   TOTAL HIP ARTHROPLASTY Right  11/28/2013   Procedure: TOTAL HIP ARTHROPLASTY ANTERIOR APPROACH;  Surgeon: Alta Corning, MD;  Location: [place];  Service: Orthopedics;  Laterality: Right;  Right total hip arthroplasty anterior approach   TOTAL HIP ARTHROPLASTY Left 05/15/2014   Procedure: TOTAL HIP ARTHROPLASTY ANTERIOR APPROACH;  Surgeon: Alta Corning, MD;  Location: [place];  Service: Orthopedics;  Laterality: Left;   TUBAL LIGATION      Family History  Problem Relation Age of Onset   Lung disease Mother    Breast cancer Paternal Aunt 24       x 2 (first time late 10s), same breast   Prostate cancer Paternal Uncle 43   Bone cancer Paternal Uncle 27   Brain cancer Paternal Uncle 31   Lung cancer Paternal Uncle 46   Asthma Father     Social History   Tobacco Use   Smoking status: Never   Smokeless tobacco: Never  Vaping Use   Vaping Use: Never used  Substance Use Topics   Alcohol use: Not Currently    Alcohol/week: 0.0 standard drinks of alcohol    Comment: occasionally   Drug use: No    No current facility-administered medications for this encounter.  Current Outpatient Medications:    acetaminophen (TYLENOL) 650 MG CR tablet, Take 650 mg by mouth 2 (two) times daily as needed for pain., Disp: , Rfl:    albuterol (VENTOLIN HFA) 108 (90 Base) MCG/ACT inhaler, Inhale  1-2 puffs into the lungs every 4 (four) hours as needed for wheezing or shortness of breath., Disp: 1 each, Rfl: 0   Calcium Carbonate-Vitamin D (CALCIUM + D PO), Take 1 tablet by mouth daily., Disp: , Rfl:    carboxymethylcellulose (REFRESH PLUS) 0.5 % SOLN, Apply to eye., Disp: , Rfl:    Cetirizine HCl 10 MG CAPS, Take 1 capsule by mouth as needed. Seasonal allergies, Disp: , Rfl:    ELDERBERRY PO, Take by mouth., Disp: , Rfl:    eltrombopag (PROMACTA) 50 MG tablet, TAKE 1 TABLET BY MOUTH  EVERY  DAY ON AN EMPTY STOMACH 1 HOUR BEFORE OR 2  HOURS AFTER A MEAL, Disp: 30 tablet, Rfl: 6   fluticasone (FLONASE) 50 MCG/ACT nasal spray, Place 2 sprays  into both nostrils daily., Disp: 16 g, Rfl: 0   furosemide (LASIX) 40 MG tablet, Take 40 mg by mouth daily. , Disp: , Rfl:    lovastatin (MEVACOR) 10 MG tablet, Take 10 mg by mouth daily., Disp: , Rfl:    Melatonin 10 MG CAPS, Take 10 mg by mouth at bedtime as needed. , Disp: , Rfl:    Multiple Vitamin (MULTIVITAMIN WITH MINERALS) TABS tablet, Take 1 tablet by mouth daily., Disp: , Rfl:    Naphazoline HCl (CLEAR EYES OP), Place 1 drop into both eyes daily., Disp: , Rfl:    NON FORMULARY, Use as directed 1 scoop in the mouth or throat daily. IT WORKS! ([employer]) pt mixes in water or fruit juice, Disp: , Rfl:    Spacer/Aero-Holding Chambers (AEROCHAMBER MV) inhaler, Use as instructed, Disp: 1 each, Rfl: 1   Vitamin D, Ergocalciferol, (DRISDOL) 1.25 MG (50000 UNIT) CAPS capsule, TAKE 1 CAPSULE BY MOUTH  ONCE WEEKLY, Disp: 13 capsule, Rfl: 3  Allergies  Allergen Reactions   Nsaids Rash    Contraindicated by ITP, per Dr. Inez Pilgrim   Aspirin     History of low platelets   Citrus     itch     ROS  As noted in HPI.   Physical Exam  BP 135/82 (BP Location: Left Arm)   Pulse 87   Temp 99.2 F (37.3 C) (Oral)   Resp 18   Ht _0  (1.651 m)   Wt 98.9 kg   SpO2 95%   BMI 36.28 kg/m   Constitutional: Well developed, well nourished, no acute distress Eyes:  EOMI, conjunctiva normal bilaterally HENT: Normocephalic, atraumatic,mucus membranes moist.  Erythematous, swollen turbinates.  Mucoid nasal congestion.  No maxillary, frontal sinus tenderness.  Difficulty visualizing oropharynx.  No appreciable erythema, uvula midline.  No left-sided tonsillar swelling, exudates.  No drooling, trismus, neck stiffness.  Normal voice. Neck: No cervical lymphadenopathy Respiratory: Normal inspiratory effort, lungs clear bilaterally, no anterior, lateral chest wall tenderness Cardiovascular: Normal rate, regular rhythm, no murmurs rubs or gallops GI: nondistended skin: No rash, skin  intact Musculoskeletal: no deformities Neurologic: Alert & oriented x 3, no focal neuro deficits Psychiatric: Speech and behavior appropriate   ED Course   Medications - No data to display  Orders Placed This Encounter  Procedures   Group A Strep by PCR    Standing Status:   Standing    Number of Occurrences:   1   SARS Coronavirus 2 by RT PCR (hospital order, performed in [place] hospital lab) *cepheid single result test* Anterior Nasal Swab    Standing Status:   Standing    Number of Occurrences:   1    Results  for orders placed or performed during the hospital encounter of 02/09/22 (from the past 24 hour(s))  Group A Strep by PCR     Status: None   Collection Time: 02/09/22 12:31 PM   Specimen: Throat; Sterile Swab  Result Value Ref Range   Group A Strep by PCR NOT DETECTED NOT DETECTED  SARS Coronavirus 2 by RT PCR (hospital order, performed in [place] hospital lab) *cepheid single result test* Anterior Nasal Swab     Status: None   Collection Time: 02/09/22  1:10 PM   Specimen: Anterior Nasal Swab  Result Value Ref Range   SARS Coronavirus 2 by RT PCR NEGATIVE NEGATIVE   No results found.  ED Clinical Impression  1. Upper respiratory tract infection, unspecified type   2. Sore throat      ED Assessment/Plan  Checking strep, COVID.  Suspect URI with cough.  Her lungs are clear today.  I do not appreciate any left-sided tonsillar swelling, evidence of a peritonsillar abscess.    She could have some leftover irritation from swallowing the watermelon.  There is no drooling, trismus, neck stiffness, muffled voice. Doubt epiglottitis, retropharyngeal abscess.  Will send home with regularly scheduled albuterol inhaler with a spacer, Flonase, saline nasal irrigation, Mucinex, Benadryl/Maalox mixture.  Follow-up with PCP as needed.  ER return precautions given  [phone].  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 Discussed sn/sx that should prompt return to the ED. patient agrees with plan.   Meds ordered this encounter  Medications   fluticasone (FLONASE) 50 MCG/ACT nasal spray    Sig: Place 2 sprays into both nostrils daily.    Dispense:  16 g    Refill:  0   albuterol (VENTOLIN HFA) 108 (90 Base) MCG/ACT inhaler    Sig: Inhale 1-2 puffs into the lungs every 4 (four) hours as needed for wheezing or shortness of breath.    Dispense:  1 each    Refill:  0   Spacer/Aero-Holding Chambers (AEROCHAMBER MV) inhaler    Sig: Use as instructed    Dispense:  1 each    Refill:  1      *This clinic note was created using Dragon dictation software. Therefore, there may be occasional mistakes despite careful proofreading.  ?    Melynda Ripple, MD 02/09/22 1358

## 2022-03-24 ENCOUNTER — Other Ambulatory Visit: Payer: Self-pay | Admitting: Obstetrics and Gynecology

## 2022-03-24 DIAGNOSIS — Z1231 Encounter for screening mammogram for malignant neoplasm of breast: Secondary | ICD-10-CM

## 2022-03-31 NOTE — Progress Notes (Unsigned)
PCP: Pcp, No   No chief complaint on file.   HPI:      Ms. Chinelo Benn Mcfayden is a 66 y.o. No obstetric history on file. who LMP was No LMP recorded. Patient is postmenopausal., presents today for her annual examination.  Her menses are absent and she is postmenopausal. She does not have PMB. She has tolerable vasomotor sx, sometimes worse than others.   Sex activity: single partner, contraception - post menopausal status. She does have vaginal dryness. Uses lubricants prn with sx relief.  Last Pap: 03/15/20 Results were: no abnormalities /neg HPV DNA.  Hx of STDs: none  Last mammogram: 04/10/21 Results were normal; repeat in 12 months. Has appt 9/23 There is a FH of breast cancer in her pat aunt twice and prostate cancer in her pat uncle. There is no FH of ovarian cancer. The patient does do self-breast exams. IBIS=19.9%. MyRisk neg except AXIN2/RNF43 VUS 1/20  Colonoscopy: colonoscopy 7/20 with Dr. Alice Reichert, without abnormalities. Repeat due after 10 years.   Tobacco use: The patient denies current or previous tobacco use. Alcohol use: social drinker  No drug use Exercise: moderately active  She does get adequate calcium and Vitamin D in her diet.  Has urine odor in AM. Hx of stage 3A CKD, followed by nephrology with neg eval. Sx resolve as day goes on. Drinks lots of water. No other UTI sx.  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 Vira Agar   COLONOSCOPY WITH PROPOFOL N/A 02/23/2019   Procedure: COLONOSCOPY WITH PROPOFOL;  Surgeon: Toledo, Benay Pike, MD;   Location: [facility] ENDOSCOPY;  Service: Endoscopy;  Laterality: N/A;   HAND SURGERY     right hand   spleenectomy  1991   x 2   TOTAL HIP ARTHROPLASTY Right 11/28/2013   Procedure: TOTAL HIP ARTHROPLASTY ANTERIOR APPROACH;  Surgeon: Alta Corning, MD;  Location: [place];  Service: Orthopedics;  Laterality: Right;  Right total hip arthroplasty anterior approach   TOTAL HIP ARTHROPLASTY Left 05/15/2014   Procedure: TOTAL HIP ARTHROPLASTY ANTERIOR APPROACH;  Surgeon: Alta Corning, MD;  Location: [place];  Service: Orthopedics;  Laterality: Left;   TUBAL LIGATION      Family History  Problem Relation Age of Onset   Lung disease Mother    Breast cancer Paternal Aunt 73       x 2 (first time late 77s), same breast   Prostate cancer Paternal Uncle 39   Bone cancer Paternal Uncle 64   Brain cancer Paternal Uncle 91   Lung cancer Paternal Uncle 90   Asthma Father     Social History   Socioeconomic History   Marital status: Married    Spouse name: Not on file   Number of children: Not on file   Years of education: Not on file   Highest education level: Not on file  Occupational History   Not on file  Tobacco Use   Smoking status: Never   Smokeless  tobacco: Never  Vaping Use   Vaping Use: Never used  Substance and Sexual Activity   Alcohol use: Not Currently    Alcohol/week: 0.0 standard drinks of alcohol    Comment: occasionally   Drug use: No   Sexual activity: Yes    Partners: Male    Birth control/protection: Post-menopausal  Other Topics Concern   Not on file  Social History Narrative   Not on file   Social Determinants of Health   Financial Resource Strain: Not on file  Food Insecurity: Not on file  Transportation Needs: Not on file  Physical Activity: Not on file  Stress: Not on file  Social Connections: Not on file  Intimate Partner Violence: Not on file    No outpatient medications have been marked as taking for the 04/01/22 encounter (Appointment) with Michaelanthony Kempton,  Deirdre Evener, PA-C.      ROS:  Review of Systems  Constitutional:  Negative for fatigue, fever and unexpected weight change.  Respiratory:  Negative for cough, shortness of breath and wheezing.   Cardiovascular:  Negative for chest pain, palpitations and leg swelling.  Gastrointestinal:  Negative for blood in stool, constipation, diarrhea, nausea and vomiting.  Endocrine: Negative for cold intolerance, heat intolerance and polyuria.  Genitourinary:  Negative for dyspareunia, dysuria, flank pain, frequency, genital sores, hematuria, menstrual problem, pelvic pain, urgency, vaginal bleeding, vaginal discharge and vaginal pain.  Musculoskeletal:  Negative for arthralgias, back pain, joint swelling and myalgias.  Skin:  Negative for rash.  Neurological:  Negative for dizziness, syncope, light-headedness, numbness and headaches.  Hematological:  Negative for adenopathy.  Psychiatric/Behavioral:  Negative for agitation, confusion, sleep disturbance and suicidal ideas. The patient is not nervous/anxious.      Objective: There were no vitals taken for this visit.   Physical Exam Constitutional:      Appearance: She is well-developed.  Genitourinary:     Vulva normal.     Right Labia: No rash, tenderness or lesions.    Left Labia: No tenderness, lesions or rash.    No vaginal discharge, erythema or tenderness.      Right Adnexa: not tender and no mass present.    Left Adnexa: not tender and no mass present.    No cervical friability or polyp.     Uterus is not enlarged or tender.  Breasts:    Right: No mass, nipple discharge, skin change or tenderness.     Left: No mass, nipple discharge, skin change or tenderness.  Neck:     Thyroid: No thyromegaly.  Cardiovascular:     Rate and Rhythm: Normal rate and regular rhythm.     Heart sounds: Normal heart sounds. No murmur heard. Pulmonary:     Effort: Pulmonary effort is normal.     Breath sounds: Normal breath sounds.  Abdominal:      Palpations: Abdomen is soft.     Tenderness: There is no abdominal tenderness. There is no guarding or rebound.  Musculoskeletal:        General: Normal range of motion.     Cervical back: Normal range of motion.  Lymphadenopathy:     Cervical: No cervical adenopathy.  Neurological:     General: No focal deficit present.     Mental Status: She is alert and oriented to person, place, and time.     Cranial Nerves: No cranial nerve deficit.  Skin:    General: Skin is warm and dry.  Psychiatric:        Mood and  Affect: Mood normal.        Behavior: Behavior normal.        Thought Content: Thought content normal.        Judgment: Judgment normal.  Vitals reviewed.    No results found for this or any previous visit (from the past 24 hour(s)).   Assessment/Plan: Encounter for annual routine gynecological examination  Encounter for screening mammogram for malignant neoplasm of breast - Plan: MM 3D SCREEN BREAST BILATERAL; pt to sched mammo  Abnormal urine odor - Plan: POCT Urinalysis Dipstick; neg UA. Hx of CKD, followed by nephrology. Sx in AM only, most likely food or med causing sx. Cont water intake         GYN counsel breast self exam, mammography screening, menopause, adequate intake of calcium and vitamin D, diet and exercise    F/U  No follow-ups on file.  Osmin Welz B. Krista Godsil, PA-C 03/31/2022 8:39 PM

## 2022-04-01 ENCOUNTER — Ambulatory Visit (INDEPENDENT_AMBULATORY_CARE_PROVIDER_SITE_OTHER): Payer: 59 | Admitting: Obstetrics and Gynecology

## 2022-04-01 ENCOUNTER — Encounter: Payer: Self-pay | Admitting: Obstetrics and Gynecology

## 2022-04-01 VITALS — BP 140/80 | Ht 65.0 in | Wt 221.0 lb

## 2022-04-01 DIAGNOSIS — Z1231 Encounter for screening mammogram for malignant neoplasm of breast: Secondary | ICD-10-CM | POA: Diagnosis not present

## 2022-04-01 DIAGNOSIS — Z01419 Encounter for gynecological examination (general) (routine) without abnormal findings: Secondary | ICD-10-CM | POA: Diagnosis not present

## 2022-04-01 NOTE — Patient Instructions (Signed)
I value your feedback and you entrusting us with your care. If you get a [facility] patient survey, I would appreciate you taking the time to let us know about your experience today. Thank you! ? ? ?

## 2022-04-17 ENCOUNTER — Ambulatory Visit
Admission: RE | Admit: 2022-04-17 | Discharge: 2022-04-17 | Disposition: A | Discharge status: Home / Self Care | Payer: 59 | Source: Ambulatory Visit | Attending: Obstetrics and Gynecology | Admitting: Obstetrics and Gynecology

## 2022-04-17 DIAGNOSIS — Z1231 Encounter for screening mammogram for malignant neoplasm of breast: Secondary | ICD-10-CM | POA: Insufficient documentation

## 2022-04-18 ENCOUNTER — Other Ambulatory Visit: Payer: Self-pay | Admitting: Obstetrics and Gynecology

## 2022-04-18 DIAGNOSIS — R928 Other abnormal and inconclusive findings on diagnostic imaging of breast: Secondary | ICD-10-CM

## 2022-04-18 DIAGNOSIS — R921 Mammographic calcification found on diagnostic imaging of breast: Secondary | ICD-10-CM

## 2022-05-09 ENCOUNTER — Inpatient Hospital Stay (HOSPITAL_BASED_OUTPATIENT_CLINIC_OR_DEPARTMENT_OTHER): Payer: 59 | Admitting: Internal Medicine

## 2022-05-09 ENCOUNTER — Encounter: Payer: Self-pay | Admitting: Internal Medicine

## 2022-05-09 ENCOUNTER — Inpatient Hospital Stay: Payer: 59 | Attending: Internal Medicine

## 2022-05-09 ENCOUNTER — Other Ambulatory Visit: Payer: Self-pay | Admitting: *Deleted

## 2022-05-09 DIAGNOSIS — D693 Immune thrombocytopenic purpura: Secondary | ICD-10-CM | POA: Diagnosis present

## 2022-05-09 DIAGNOSIS — Z79899 Other long term (current) drug therapy: Secondary | ICD-10-CM | POA: Insufficient documentation

## 2022-05-09 LAB — CBC WITH DIFFERENTIAL/PLATELET
Abs Immature Granulocytes: 0.01 10*3/uL (ref 0.00–0.07)
Basophils Absolute: 0.1 10*3/uL (ref 0.0–0.1)
Basophils Relative: 1 %
Eosinophils Absolute: 0.3 10*3/uL (ref 0.0–0.5)
Eosinophils Relative: 5 %
HCT: 34.5 % — ABNORMAL LOW (ref 36.0–46.0)
Hemoglobin: 12.1 g/dL (ref 12.0–15.0)
Immature Granulocytes: 0 %
Lymphocytes Relative: 30 %
Lymphs Abs: 1.7 10*3/uL (ref 0.7–4.0)
MCH: 30.3 pg (ref 26.0–34.0)
MCHC: 35.1 g/dL (ref 30.0–36.0)
MCV: 86.5 fL (ref 80.0–100.0)
Monocytes Absolute: 0.5 10*3/uL (ref 0.1–1.0)
Monocytes Relative: 9 %
Neutro Abs: 3.1 10*3/uL (ref 1.7–7.7)
Neutrophils Relative %: 55 %
Platelets: 52 10*3/uL — ABNORMAL LOW (ref 150–400)
RBC: 3.99 MIL/uL (ref 3.87–5.11)
RDW: 14.4 % (ref 11.5–15.5)
WBC: 5.5 10*3/uL (ref 4.0–10.5)
nRBC: 0 % (ref 0.0–0.2)

## 2022-05-09 LAB — COMPREHENSIVE METABOLIC PANEL
ALT: 29 U/L (ref 0–44)
AST: 30 U/L (ref 15–41)
Albumin: 4 g/dL (ref 3.5–5.0)
Alkaline Phosphatase: 73 U/L (ref 38–126)
Anion gap: 2 — ABNORMAL LOW (ref 5–15)
BUN: 18 mg/dL (ref 8–23)
CO2: 25 mmol/L (ref 22–32)
Calcium: 9.3 mg/dL (ref 8.9–10.3)
Chloride: 109 mmol/L (ref 98–111)
Creatinine, Ser: 1.17 mg/dL — ABNORMAL HIGH (ref 0.44–1.00)
GFR, Estimated: 52 mL/min — ABNORMAL LOW (ref >60)
Glucose, Bld: 109 mg/dL — ABNORMAL HIGH (ref 70–99)
Potassium: 3.7 mmol/L (ref 3.5–5.1)
Sodium: 136 mmol/L (ref 135–145)
Total Bilirubin: 0.7 mg/dL (ref 0.3–1.2)
Total Protein: 7.4 g/dL (ref 6.5–8.1)

## 2022-05-09 NOTE — Assessment & Plan Note (Addendum)
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 on Promacta since October 2016.  #Patient currently on Promacta 50 mg twice a week; platelet count is  52-slightly low however overa all stable.  Continue at current dose [joint pains]. Currently off promacta for last 2 weeks sec to Pharmacy issues.   #  CKD- stage III- [GFR 45]- STABLE [Dr.Korrapati]- STABLE.    # Lymphedema BIL LE [on lasix per nephro]; recommend compression stockings leg elevation- STABLE.   DISPOSITION:   # follow up in 6 months-MD; labs cbc/cmp- Dr.B   Cc; Dr.Thies.

## 2022-05-09 NOTE — Progress Notes (Signed)
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     HISTORY OF PRESENTING ILLNESS: Alone. Walking independently.  Victoria Morgan [age].o.  female  African-American female patient with a history of chronic refractory ITP currently on Promacta 50 mg twice a week /CKD- III is here for follow-up.   Out of Promacta for the past week due to pharmacy issues.  Feeling more fatigued.  Patient denies any easy bruising.  Denies any blood in stools or black or stools.  She admits to compliance with her Promacta 50 mg twice a week [Mondays and Thursdays].  Chronic mild swelling of the legs not any worse.   Review of Systems  Constitutional:  Negative for chills, diaphoresis, fever, malaise/fatigue and weight loss.  HENT:  Negative for nosebleeds and sore throat.   Eyes:  Negative for double vision.  Respiratory:  Negative for cough, hemoptysis, sputum production, shortness of breath and wheezing.   Cardiovascular:  Negative for chest pain, palpitations, orthopnea and leg swelling.  Gastrointestinal:  Negative for abdominal pain, blood in stool, constipation, diarrhea, heartburn, melena, nausea and vomiting.  Genitourinary:  Negative for dysuria, frequency and urgency.  Musculoskeletal:  Positive for back pain and joint pain.  Skin: Negative.  Negative for itching and rash.   Neurological:  Negative for dizziness, tingling, focal weakness, weakness and headaches.  Endo/Heme/Allergies:  Does not bruise/bleed easily.  Psychiatric/Behavioral:  Negative for depression. The patient is not nervous/anxious and does not have insomnia.      MEDICAL HISTORY:  Past Medical History:  Diagnosis Date   Asthma    Blood dyscrasia    ITP   dx in 1990   BRCA negative 08/2018   MyRisk neg except AXIN2/RNF43 VUS   Chronic kidney disease    followed at [facility] Family history of breast cancer 08/2018   IBIS=19.9%   History of arthritis    in hips; now resolved after hip surgery   ITP (idiopathic thrombocytopenic purpura) 05/01/2015    SURGICAL HISTORY: Past Surgical History:  Procedure Laterality Date   COLONOSCOPY  09/18/2008   Dr. ElVira Agar COLONOSCOPY WITH PROPOFOL N/A 02/23/2019   Procedure: COLONOSCOPY WITH PROPOFOL;  Surgeon: Toledo, TeBenay PikeMD;  Location: [facility] ENDOSCOPY;  Service: Endoscopy;  Laterality: N/A;   HAND SURGERY     right hand   spleenectomy  1991   x 2   TOTAL HIP ARTHROPLASTY Right 11/28/2013   Procedure: TOTAL HIP ARTHROPLASTY ANTERIOR APPROACH;  Surgeon: JoAlta CorningMD;  [facility] Service: Orthopedics;  Laterality: Right;  Right total hip arthroplasty anterior approach   TOTAL HIP ARTHROPLASTY Left 05/15/2014   Procedure: TOTAL HIP ARTHROPLASTY ANTERIOR APPROACH;  Surgeon: JoAlta CorningMD;  [facility]: [facility];  Laterality: Left;   TUBAL LIGATION      SOCIAL HISTORY: Social History   Socioeconomic History   Marital status: Married  Spouse name: Not on file   Number of children: Not on file   Years of education: Not on file   Highest education level: Not on file  Occupational History   Not on file  Tobacco Use   Smoking status: Never   Smokeless tobacco: Never  Vaping Use   Vaping Use: Never used  Substance and Sexual Activity   Alcohol use: Not Currently    Alcohol/week: 0.0 standard drinks of  alcohol    Comment: occasionally   Drug use: No   Sexual activity: Yes    Partners: Male    Birth control/protection: Post-menopausal  Other Topics Concern   Not on file  Social History Narrative   Not on file   Social Determinants of Health   Financial Resource Strain: Not on file  Food Insecurity: Not on file  Transportation Needs: Not on file  Physical Activity: Not on file  Stress: Not on file  Social Connections: Not on file  Intimate Partner Violence: Not on file    FAMILY HISTORY: Family History  Problem Relation Age of Onset   Lung disease Mother    Breast cancer Paternal Aunt 31       x 2 (first time late 62s), same breast   Prostate cancer Paternal Uncle 86   Bone cancer Paternal Uncle 62   Brain cancer Paternal Uncle 61   Lung cancer Paternal Uncle 32   Asthma Father     ALLERGIES:  is allergic to nsaids, aspirin, and citrus.  MEDICATIONS:  Current Outpatient Medications  Medication Sig Dispense Refill   acetaminophen (TYLENOL) 650 MG CR tablet Take 650 mg by mouth 2 (two) times daily as needed for pain.     albuterol (VENTOLIN HFA) 108 (90 Base) MCG/ACT inhaler Inhale 1-2 puffs into the lungs every 4 (four) hours as needed for wheezing or shortness of breath. 1 each 0   buPROPion (WELLBUTRIN SR) 150 MG 12 hr tablet Take 150 mg by mouth daily.     Calcium Carbonate-Vitamin D (CALCIUM + D PO) Take 1 tablet by mouth daily.     carboxymethylcellulose (REFRESH PLUS) 0.5 % SOLN Apply to eye.     Cetirizine HCl 10 MG CAPS Take 1 capsule by mouth as needed. Seasonal allergies     ELDERBERRY PO Take by mouth.     fluticasone (FLONASE) 50 MCG/ACT nasal spray Place 2 sprays into both nostrils daily. 16 g 0   furosemide (LASIX) 40 MG tablet Take 40 mg by mouth daily.     lovastatin (MEVACOR) 10 MG tablet Take 10 mg by mouth daily.     Melatonin 10 MG CAPS Take 10 mg by mouth at bedtime as needed.      Multiple Vitamin (MULTIVITAMIN WITH MINERALS) TABS tablet Take 1  tablet by mouth daily.     Naphazoline HCl (CLEAR EYES OP) Place 1 drop into both eyes daily.     NON FORMULARY Use as directed 1 scoop in the mouth or throat daily. IT WORKS! ([employer]) pt mixes in water or fruit juice     Spacer/Aero-Holding Chambers (AEROCHAMBER MV) inhaler Use as instructed 1 each 1   Vitamin D, Ergocalciferol, (DRISDOL) 1.25 MG (50000 UNIT) CAPS capsule TAKE 1 CAPSULE BY MOUTH  ONCE WEEKLY 13 capsule 3   eltrombopag (PROMACTA) 50 MG tablet TAKE 1 TABLET BY MOUTH  EVERY  DAY ON AN EMPTY STOMACH 1 HOUR BEFORE OR 2  HOURS AFTER A MEAL (Patient not taking: Reported on 05/09/2022) 30  tablet 6   No current facility-administered medications for this visit.      Marland Kitchen  PHYSICAL EXAMINATION: ECOG PERFORMANCE STATUS: 0 - Asymptomatic  Vitals:   05/09/22 1500  BP: 127/74  Pulse: 64  Resp: 18  Temp: (!) 97.1 F (36.2 C)    Filed Weights   05/09/22 1500  Weight: 222 lb 12.8 oz (101.1 kg)     Physical Exam HENT:     Head: Normocephalic and atraumatic.     Mouth/Throat:     Pharynx: No oropharyngeal exudate.  Eyes:     Pupils: Pupils are equal, round, and reactive to light.  Cardiovascular:     Rate and Rhythm: Normal rate and regular rhythm.  Pulmonary:     Effort: Pulmonary effort is normal. No respiratory distress.     Breath sounds: Normal breath sounds. No wheezing.  Abdominal:     General: Bowel sounds are normal. There is no distension.     Palpations: Abdomen is soft. There is no mass.     Tenderness: There is no abdominal tenderness. There is no guarding or rebound.  Musculoskeletal:        General: No tenderness. Normal range of motion.     Cervical back: Normal range of motion and neck supple.  Skin:    General: Skin is warm.  Neurological:     Mental Status: She is alert and oriented to person, place, and time.  Psychiatric:        Mood and Affect: Affect normal.      LABORATORY DATA:  I have reviewed the data as listed Lab Results   Component Value Date   WBC 5.5 05/09/2022   HGB 12.1 05/09/2022   HCT 34.5 (L) 05/09/2022   MCV 86.5 05/09/2022   PLT 52 (L) 05/09/2022   Recent Labs    05/20/21 1510 11/05/21 1510 05/09/22 1446  NA 136 136 136  K 3.6 3.9 3.7  CL 105 103 109  CO2 _0 GLUCOSE 91 95 109*  BUN _1 CREATININE 1.11* 1.20* 1.17*  CALCIUM 9.2 9.5 9.3  GFRNONAA 56* 51* 52*  PROT 7.3 7.5 7.4  ALBUMIN 4.1 4.3 4.0  AST _2 ALT _3 ALKPHOS 69 69 73  BILITOT 0.3 0.3 0.7    RADIOGRAPHIC STUDIES: I have personally reviewed the radiological images as listed and agreed with the findings in the report. MM 3D SCREEN BREAST BILATERAL  Result Date: 04/18/2022 CLINICAL DATA:  Screening. EXAM: DIGITAL SCREENING BILATERAL MAMMOGRAM WITH TOMOSYNTHESIS AND CAD TECHNIQUE: Bilateral screening digital craniocaudal and mediolateral oblique mammograms were obtained. Bilateral screening digital breast tomosynthesis was performed. The images were evaluated with computer-aided detection. COMPARISON:  Previous exam(s). ACR Breast Density Category b: There are scattered areas of fibroglandular density. FINDINGS: In the right breast, calcifications warrant further evaluation with magnified views. In the left breast, no findings suspicious for malignancy. IMPRESSION: Further evaluation is suggested for calcifications in the right breast. RECOMMENDATION: Diagnostic mammogram of the right breast. (Code:[id number]) The patient will be contacted regarding the findings, and additional imaging will be scheduled. BI-RADS CATEGORY  0: Incomplete. Need additional imaging evaluation and/or prior mammograms for comparison. Electronically Signed   By: Evangeline Dakin M.D.   On: 04/18/2022 13:06    ASSESSMENT & PLAN:   Idiopathic thrombocytopenic purpura (HCC) Chronic refractory ITP status post multiple lines of therapy; steroid responsive but intolerant. on Promacta since October 2016.  #Patient currently on Promacta  50 mg twice a week; platelet count is  52-slightly low however overa all stable.  Continue at current dose [joint pains]. Currently off promacta for last 2 weeks sec to Pharmacy issues.   #  CKD- stage III- [GFR 45]- STABLE [Dr.Korrapati]- STABLE.    # Lymphedema BIL LE [on lasix per nephro]; recommend compression stockings leg elevation- STABLE.   DISPOSITION:   # follow up in 6 months-MD; labs cbc/cmp- Dr.B   Cc; Dr.Thies.     All questions were answered. The patient knows to call the clinic with any problems, questions or concerns.     Cammie Sickle, MD 05/09/2022 3:43 PM

## 2022-05-09 NOTE — Progress Notes (Signed)
Out of Promacta for the past week due to pharmacy issues.  Feeling more fatigued.

## 2022-05-16 ENCOUNTER — Ambulatory Visit
Admission: RE | Admit: 2022-05-16 | Discharge: 2022-05-16 | Disposition: A | Discharge status: Home / Self Care | Payer: 59 | Source: Ambulatory Visit | Attending: Obstetrics and Gynecology | Admitting: Obstetrics and Gynecology

## 2022-05-16 DIAGNOSIS — R928 Other abnormal and inconclusive findings on diagnostic imaging of breast: Secondary | ICD-10-CM | POA: Insufficient documentation

## 2022-05-16 DIAGNOSIS — R921 Mammographic calcification found on diagnostic imaging of breast: Secondary | ICD-10-CM | POA: Insufficient documentation

## 2022-05-19 ENCOUNTER — Encounter: Payer: Self-pay | Admitting: Obstetrics and Gynecology

## 2022-05-19 ENCOUNTER — Other Ambulatory Visit: Payer: Self-pay | Admitting: Obstetrics and Gynecology

## 2022-05-19 DIAGNOSIS — R928 Other abnormal and inconclusive findings on diagnostic imaging of breast: Secondary | ICD-10-CM

## 2022-05-28 ENCOUNTER — Other Ambulatory Visit: Payer: Self-pay | Admitting: Internal Medicine

## 2022-07-07 ENCOUNTER — Other Ambulatory Visit (HOSPITAL_COMMUNITY): Payer: Self-pay

## 2022-07-07 ENCOUNTER — Telehealth: Payer: Self-pay

## 2022-07-07 NOTE — Telephone Encounter (Signed)
[name] Encounter   Received notification that prior authorization for Promacta is due for renewal.   PA submitted on 11.27.23  Key [id number]  Status is pending     Ardeen Fillers, CPhT Oncology Pharmacy Patient Advocate  [facility]  [phone] (phone) [phone] (fax) 07/07/2022 2:51 PM

## 2022-07-07 NOTE — Telephone Encounter (Signed)
[name] Encounter  Prior Authorization for Val Riles has been approved.    PA# [id number]  Effective dates: 11.27.23 through 11.27.24    Ardeen Fillers, CPhT Oncology Pharmacy Patient Advocate  [facility]  [phone] (phone) [phone] (fax) 07/07/2022 3:02 PM

## 2022-10-14 ENCOUNTER — Other Ambulatory Visit: Payer: Self-pay

## 2022-10-14 ENCOUNTER — Ambulatory Visit
Admission: EM | Admit: 2022-10-14 | Discharge: 2022-10-14 | Disposition: A | Discharge status: Home / Self Care | Payer: 59 | Attending: Physician Assistant | Admitting: Physician Assistant

## 2022-10-14 DIAGNOSIS — T300 Burn of unspecified body region, unspecified degree: Secondary | ICD-10-CM | POA: Diagnosis not present

## 2022-10-14 MED ORDER — MUPIROCIN 2 % EX OINT: 1.0000 | TOPICAL_OINTMENT | Freq: Two times a day (BID) | CUTANEOUS | 0 refills | Status: AC

## 2022-10-14 NOTE — Discharge Instructions (Signed)
As we discussed, I would like you to stop using hydrogen peroxide and do not start using alcohol.  Avoid over-the-counter medications.  Keep this clean with soap and water.  Apply Bactroban ointment twice daily.  Follow-up with either our clinic or your primary care within a week to ensure that this is healing appropriately.  If anything changes and it becomes larger, red, swollen, painful or if you develop fever/nausea/vomiting you need to be seen immediately.
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
## 2022-11-06 ENCOUNTER — Other Ambulatory Visit: Payer: Self-pay | Admitting: Internal Medicine

## 2022-11-07 ENCOUNTER — Other Ambulatory Visit: Payer: 59

## 2022-11-07 ENCOUNTER — Inpatient Hospital Stay: Payer: 59 | Attending: Internal Medicine

## 2022-11-07 ENCOUNTER — Ambulatory Visit: Payer: 59 | Admitting: Nurse Practitioner

## 2022-11-07 ENCOUNTER — Inpatient Hospital Stay (HOSPITAL_BASED_OUTPATIENT_CLINIC_OR_DEPARTMENT_OTHER): Payer: 59 | Admitting: Nurse Practitioner

## 2022-11-07 ENCOUNTER — Encounter: Payer: Self-pay | Admitting: Nurse Practitioner

## 2022-11-07 VITALS — BP 134/74 | HR 63 | Temp 97.6°F | Wt 215.0 lb

## 2022-11-07 DIAGNOSIS — D693 Immune thrombocytopenic purpura: Secondary | ICD-10-CM | POA: Insufficient documentation

## 2022-11-07 DIAGNOSIS — Z79899 Other long term (current) drug therapy: Secondary | ICD-10-CM | POA: Diagnosis not present

## 2022-11-07 LAB — CBC WITH DIFFERENTIAL/PLATELET
Abs Immature Granulocytes: 0.03 10*3/uL (ref 0.00–0.07)
Basophils Absolute: 0.1 10*3/uL (ref 0.0–0.1)
Basophils Relative: 1 %
Eosinophils Absolute: 0.1 10*3/uL (ref 0.0–0.5)
Eosinophils Relative: 1 %
HCT: 37.3 % (ref 36.0–46.0)
Hemoglobin: 12.8 g/dL (ref 12.0–15.0)
Immature Granulocytes: 0 %
Lymphocytes Relative: 27 %
Lymphs Abs: 3 10*3/uL (ref 0.7–4.0)
MCH: 29.9 pg (ref 26.0–34.0)
MCHC: 34.3 g/dL (ref 30.0–36.0)
MCV: 87.1 fL (ref 80.0–100.0)
Monocytes Absolute: 1.1 10*3/uL — ABNORMAL HIGH (ref 0.1–1.0)
Monocytes Relative: 10 %
Neutro Abs: 6.8 10*3/uL (ref 1.7–7.7)
Neutrophils Relative %: 61 %
Platelets: 232 10*3/uL (ref 150–400)
RBC: 4.28 MIL/uL (ref 3.87–5.11)
RDW: 13.9 % (ref 11.5–15.5)
WBC: 11.2 10*3/uL — ABNORMAL HIGH (ref 4.0–10.5)
nRBC: 0 % (ref 0.0–0.2)

## 2022-11-07 LAB — COMPREHENSIVE METABOLIC PANEL
ALT: 34 U/L (ref 0–44)
AST: 32 U/L (ref 15–41)
Albumin: 4.5 g/dL (ref 3.5–5.0)
Alkaline Phosphatase: 78 U/L (ref 38–126)
Anion gap: 7 (ref 5–15)
BUN: 21 mg/dL (ref 8–23)
CO2: 28 mmol/L (ref 22–32)
Calcium: 9.6 mg/dL (ref 8.9–10.3)
Chloride: 103 mmol/L (ref 98–111)
Creatinine, Ser: 1.3 mg/dL — ABNORMAL HIGH (ref 0.44–1.00)
GFR, Estimated: 46 mL/min — ABNORMAL LOW (ref >60)
Glucose, Bld: 83 mg/dL (ref 70–99)
Potassium: 3.4 mmol/L — ABNORMAL LOW (ref 3.5–5.1)
Sodium: 138 mmol/L (ref 135–145)
Total Bilirubin: 0.5 mg/dL (ref 0.3–1.2)
Total Protein: 7.9 g/dL (ref 6.5–8.1)

## 2022-11-07 NOTE — Progress Notes (Signed)
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     HISTORY OF PRESENTING ILLNESS: Alone. Walking independently.  Victoria Morgan 67 y.o.  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 She feels well. Had aches and pains with increased dosing of promacta. Denies easy bruising, unusual rashes, black or bloody stools.    Review of Systems  Constitutional:  Negative for chills, diaphoresis, fever, malaise/fatigue and weight loss.  HENT:  Negative for nosebleeds and sore throat.   Eyes:  Negative for double vision.  Respiratory:  Negative for cough, hemoptysis, sputum production, shortness of breath and wheezing.   Cardiovascular:  Negative for chest pain, palpitations, orthopnea and leg swelling.  Gastrointestinal:  Negative for abdominal pain, blood in stool, constipation, diarrhea, heartburn, melena, nausea and vomiting.  Genitourinary:  Negative for dysuria, frequency and urgency.  Musculoskeletal:  Positive for back pain and joint pain.  Skin: Negative.  Negative for itching and rash.  Neurological:  Negative for dizziness, tingling, focal weakness, weakness and headaches.  Endo/Heme/Allergies:  Does not bruise/bleed easily.   Psychiatric/Behavioral:  Negative for depression. The patient is not nervous/anxious and does not have insomnia.      MEDICAL HISTORY:  Past Medical History:  Diagnosis Date   Asthma    Blood dyscrasia    ITP   dx in 1990   BRCA negative 08/2018   MyRisk neg except AXIN2/RNF43 VUS   Chronic kidney disease    followed at [facility]   Family history of breast cancer 08/2018   IBIS=19.9%   History of arthritis    in hips; now resolved after hip surgery   ITP (idiopathic thrombocytopenic purpura) 05/01/2015    SURGICAL HISTORY: Past Surgical History:  Procedure Laterality Date   COLONOSCOPY  09/18/2008   Dr. Mechele Collin   COLONOSCOPY WITH PROPOFOL N/A 02/23/2019   Procedure: COLONOSCOPY WITH PROPOFOL;  Surgeon: Toledo, Boykin Nearing, MD;  Location: [facility] ENDOSCOPY;  Service: Endoscopy;  Laterality: N/A;   HAND SURGERY     right hand   spleenectomy  1991   x 2   TOTAL HIP ARTHROPLASTY Right 11/28/2013   Procedure: TOTAL HIP ARTHROPLASTY ANTERIOR APPROACH;  Surgeon: Harvie Junior, MD;  Location: [facility] OR;  Service: Orthopedics;  Laterality: Right;  Right total hip arthroplasty anterior approach   TOTAL HIP ARTHROPLASTY Left 05/15/2014   Procedure: TOTAL HIP ARTHROPLASTY ANTERIOR APPROACH;  Surgeon: Harvie Junior, MD;  Location: [facility] OR;  Service: Orthopedics;  Laterality: Left;   TUBAL LIGATION      SOCIAL HISTORY: Social History   Socioeconomic History   Marital status: Married    Spouse name: Not on file   Number of children: Not on file   Years of education: Not on file   Highest education level: Not on file  Occupational History  Not on file  Tobacco Use   Smoking status: Never   Smokeless tobacco: Never  Vaping Use   Vaping Use: Never used  Substance and Sexual Activity   Alcohol use: Yes    Comment: occasionally   Drug use: No   Sexual activity: Yes    Partners: Male    Birth control/protection: Post-menopausal  Other Topics Concern   Not on file  Social History Narrative    Not on file   Social Determinants of Health   Financial Resource Strain: Not on file  Food Insecurity: Not on file  Transportation Needs: Not on file  Physical Activity: Not on file  Stress: Not on file  Social Connections: Not on file  Intimate Partner Violence: Not on file    FAMILY HISTORY: Family History  Problem Relation Age of Onset   Lung disease Mother    Breast cancer Paternal Aunt 62       x 2 (first time late 61s), same breast   Prostate cancer Paternal Uncle 50   Bone cancer Paternal Uncle 2   Brain cancer Paternal Uncle 69   Lung cancer Paternal Uncle 20   Asthma Father     ALLERGIES:  is allergic to nsaids, aspirin, and citrus.  MEDICATIONS:  Current Outpatient Medications  Medication Sig Dispense Refill   acetaminophen (TYLENOL) 650 MG CR tablet Take 650 mg by mouth 2 (two) times daily as needed for pain.     albuterol (VENTOLIN HFA) 108 (90 Base) MCG/ACT inhaler Inhale 1-2 puffs into the lungs every 4 (four) hours as needed for wheezing or shortness of breath. 1 each 0   buPROPion (WELLBUTRIN SR) 150 MG 12 hr tablet Take 150 mg by mouth daily.     Calcium Carbonate-Vitamin D (CALCIUM + D PO) Take 1 tablet by mouth daily.     carboxymethylcellulose (REFRESH PLUS) 0.5 % SOLN Apply to eye.     Cetirizine HCl 10 MG CAPS Take 1 capsule by mouth as needed. Seasonal allergies     ELDERBERRY PO Take by mouth.     eltrombopag (PROMACTA) 50 MG tablet TAKE 1 TABLET BY MOUTH ONCE DAILY ON AN EMPTY STOMACH  AT LEAST 1 HOUR BEFORE OR 2 HOURS AFTER A MEAL 30 tablet 6   fluticasone (FLONASE) 50 MCG/ACT nasal spray Place 2 sprays into both nostrils daily. 16 g 0   furosemide (LASIX) 40 MG tablet Take 40 mg by mouth daily.     lovastatin (MEVACOR) 10 MG tablet Take 10 mg by mouth daily.     Melatonin 10 MG CAPS Take 10 mg by mouth at bedtime as needed.      Multiple Vitamin (MULTIVITAMIN WITH MINERALS) TABS tablet Take 1 tablet by mouth daily.     mupirocin ointment  (BACTROBAN) 2 % Apply 1 Application topically 2 (two) times daily. 22 g 0   Naphazoline HCl (CLEAR EYES OP) Place 1 drop into both eyes daily.     NON FORMULARY Use as directed 1 scoop in the mouth or throat daily. IT WORKS! (The Greens) pt mixes in water or fruit juice     Spacer/Aero-Holding Chambers (AEROCHAMBER MV) inhaler Use as instructed 1 each 1   Vitamin D, Ergocalciferol, (DRISDOL) 1.25 MG (50000 UNIT) CAPS capsule TAKE 1 CAPSULE BY MOUTH  ONCE WEEKLY 13 capsule 3   No current facility-administered medications for this visit.     PHYSICAL EXAMINATION: ECOG PERFORMANCE STATUS: 0 - Asymptomatic  Vitals:   11/07/22 1122  BP: 134/74  Pulse: 63  Temp: 97.6 F (36.4 C)  SpO2: 100%   Filed Weights   11/07/22 1122  Weight: 215 lb (97.5 kg)   Physical Exam Vitals reviewed.  Constitutional:      Appearance: She is not ill-appearing.  HENT:     Mouth/Throat:     Pharynx: No oropharyngeal exudate.  Cardiovascular:     Rate and Rhythm: Normal rate and regular rhythm.  Pulmonary:     Effort: Pulmonary effort is normal. No respiratory distress.     Breath sounds: No wheezing.  Abdominal:     General: There is no distension.     Palpations: Abdomen is soft.     Tenderness: There is no abdominal tenderness. There is no guarding.  Musculoskeletal:     Right lower leg: No edema.     Left lower leg: No edema.  Skin:    General: Skin is warm.  Neurological:     Mental Status: She is alert and oriented to person, place, and time.  Psychiatric:        Mood and Affect: Mood and affect normal.        Behavior: Behavior normal.   LABORATORY DATA:  I have reviewed the data as listed Lab Results  Component Value Date   WBC 11.2 (H) 11/07/2022   HGB 12.8 11/07/2022   HCT 37.3 11/07/2022   MCV 87.1 11/07/2022   PLT 232 11/07/2022   Recent Labs    05/09/22 1446 11/07/22 1111  NA 136 138  K 3.7 3.4*  CL 109 103  CO2 25 28  GLUCOSE 109* 83  BUN 18 21  CREATININE 1.17*  1.30*  CALCIUM 9.3 9.6  GFRNONAA 52* 46*  PROT 7.4 7.9  ALBUMIN 4.0 4.5  AST 30 32  ALT 29 34  ALKPHOS 73 78  BILITOT 0.7 0.5    RADIOGRAPHIC STUDIES: I have personally reviewed the radiological images as listed and agreed with the findings in the report. No results found.  ASSESSMENT & PLAN:   No problem-specific Assessment & Plan notes found for this encounter.  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 On Promacta since October 2016. Currently on Promacta 50 mg twice a week. Tolerating well. Platelets 232 today. Improved. Continue current dosing.  CKD- stage III. GFR 45. Followed by Dr Suezanne Jacquet. Stable.  BLE edema- improved with lasix, elevation, and compression.    DISPOSITION:   6 mo- lab (cbc, cmp), Dr Donneta Romberg- [place]    All questions were answered. The patient knows to call the clinic with any problems, questions or concerns.   Alinda Dooms, NP 11/07/2022   CC: Dr.Thies

## 2022-11-19 ENCOUNTER — Encounter (HOSPITAL_BASED_OUTPATIENT_CLINIC_OR_DEPARTMENT_OTHER): Payer: 59 | Admitting: General Surgery

## 2022-11-21 ENCOUNTER — Ambulatory Visit
Admission: RE | Admit: 2022-11-21 | Discharge: 2022-11-21 | Disposition: A | Discharge status: Home / Self Care | Payer: 59 | Source: Ambulatory Visit | Attending: Obstetrics and Gynecology | Admitting: Obstetrics and Gynecology

## 2022-11-21 DIAGNOSIS — R928 Other abnormal and inconclusive findings on diagnostic imaging of breast: Secondary | ICD-10-CM | POA: Diagnosis present

## 2022-11-23 ENCOUNTER — Encounter: Payer: Self-pay | Admitting: Obstetrics and Gynecology

## 2023-05-04 ENCOUNTER — Inpatient Hospital Stay: Payer: 59 | Attending: Internal Medicine

## 2023-05-04 ENCOUNTER — Other Ambulatory Visit: Payer: Self-pay | Admitting: *Deleted

## 2023-05-04 ENCOUNTER — Encounter: Payer: Self-pay | Admitting: Nurse Practitioner

## 2023-05-04 ENCOUNTER — Inpatient Hospital Stay (HOSPITAL_BASED_OUTPATIENT_CLINIC_OR_DEPARTMENT_OTHER): Payer: 59 | Admitting: Nurse Practitioner

## 2023-05-04 VITALS — BP 126/76 | HR 75 | Temp 97.6°F | Resp 20 | Wt 232.2 lb

## 2023-05-04 DIAGNOSIS — Q8901 Asplenia (congenital): Secondary | ICD-10-CM | POA: Insufficient documentation

## 2023-05-04 DIAGNOSIS — N183 Chronic kidney disease, stage 3 unspecified: Secondary | ICD-10-CM | POA: Diagnosis not present

## 2023-05-04 DIAGNOSIS — D693 Immune thrombocytopenic purpura: Secondary | ICD-10-CM

## 2023-05-04 DIAGNOSIS — I890 Lymphedema, not elsewhere classified: Secondary | ICD-10-CM | POA: Insufficient documentation

## 2023-05-04 DIAGNOSIS — Z79899 Other long term (current) drug therapy: Secondary | ICD-10-CM | POA: Diagnosis not present

## 2023-05-04 LAB — CMP (CANCER CENTER ONLY)
ALT: 26 U/L (ref 0–44)
AST: 30 U/L (ref 15–41)
Albumin: 4.4 g/dL (ref 3.5–5.0)
Alkaline Phosphatase: 79 U/L (ref 38–126)
Anion gap: 9 (ref 5–15)
BUN: 17 mg/dL (ref 8–23)
CO2: 27 mmol/L (ref 22–32)
Calcium: 9.8 mg/dL (ref 8.9–10.3)
Chloride: 102 mmol/L (ref 98–111)
Creatinine: 1.29 mg/dL — ABNORMAL HIGH (ref 0.44–1.00)
GFR, Estimated: 46 mL/min — ABNORMAL LOW (ref >60)
Glucose, Bld: 114 mg/dL — ABNORMAL HIGH (ref 70–99)
Potassium: 3.7 mmol/L (ref 3.5–5.1)
Sodium: 138 mmol/L (ref 135–145)
Total Bilirubin: 0.5 mg/dL (ref 0.3–1.2)
Total Protein: 7.5 g/dL (ref 6.5–8.1)

## 2023-05-04 LAB — CBC WITH DIFFERENTIAL (CANCER CENTER ONLY)
Abs Immature Granulocytes: 0.02 10*3/uL (ref 0.00–0.07)
Basophils Absolute: 0.1 10*3/uL (ref 0.0–0.1)
Basophils Relative: 1 %
Eosinophils Absolute: 0.3 10*3/uL (ref 0.0–0.5)
Eosinophils Relative: 5 %
HCT: 37.1 % (ref 36.0–46.0)
Hemoglobin: 12.8 g/dL (ref 12.0–15.0)
Immature Granulocytes: 0 %
Lymphocytes Relative: 28 %
Lymphs Abs: 1.7 10*3/uL (ref 0.7–4.0)
MCH: 30.5 pg (ref 26.0–34.0)
MCHC: 34.5 g/dL (ref 30.0–36.0)
MCV: 88.5 fL (ref 80.0–100.0)
Monocytes Absolute: 0.5 10*3/uL (ref 0.1–1.0)
Monocytes Relative: 9 %
Neutro Abs: 3.5 10*3/uL (ref 1.7–7.7)
Neutrophils Relative %: 57 %
Platelet Count: 73 10*3/uL — ABNORMAL LOW (ref 150–400)
RBC: 4.19 MIL/uL (ref 3.87–5.11)
RDW: 13.7 % (ref 11.5–15.5)
WBC Count: 6.1 10*3/uL (ref 4.0–10.5)
nRBC: 0 % (ref 0.0–0.2)

## 2023-05-04 NOTE — Progress Notes (Signed)
Patient states ankles are swelling more.

## 2023-05-04 NOTE — Progress Notes (Signed)
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    HISTORY OF PRESENTING ILLNESS: Alone. Walking independently.  Victoria Morgan 67 y.o. female African-American female patient with a history of chronic refractory ITP currently on Promacta 50 mg twice a week, CKD stage III, who returns to clinic for follow up. She feels well. Is having arthroscopic shoulder surgery in November but hasn't set a date yet. Ankles are swollen. Denies easy bruising, unusual rashes, black or bloody stools. Aches and pains are stable.    Review of Systems  Constitutional:  Negative for chills, diaphoresis, fever, malaise/fatigue and weight loss.  HENT:  Negative for nosebleeds and sore throat.   Eyes:  Negative for double vision.  Respiratory:  Negative for cough, hemoptysis, sputum production, shortness of breath and wheezing.   Cardiovascular:  Positive for leg swelling. Negative for chest pain, palpitations and orthopnea.  Gastrointestinal:  Negative for abdominal pain, blood in stool, constipation, diarrhea, heartburn, melena, nausea and vomiting.  Genitourinary:  Negative for dysuria, frequency and urgency.  Musculoskeletal:  Positive for back pain and joint pain.  Skin:  Negative for itching and rash.  Neurological:  Negative for dizziness, tingling, focal weakness, weakness and  headaches.  Endo/Heme/Allergies:  Does not bruise/bleed easily.  Psychiatric/Behavioral:  Negative for depression. The patient is not nervous/anxious and does not have insomnia.     MEDICAL HISTORY:  Past Medical History:  Diagnosis Date   Asthma    Blood dyscrasia    ITP   dx in 1990   BRCA negative 08/2018   MyRisk neg except AXIN2/RNF43 VUS   Chronic kidney disease    followed at [facility]   Family history of breast cancer 08/2018   IBIS=19.9%   History of arthritis    in hips; now resolved after hip surgery   ITP (idiopathic thrombocytopenic purpura) 05/01/2015    SURGICAL HISTORY: Past Surgical History:  Procedure Laterality Date   COLONOSCOPY  09/18/2008   Dr. Mechele Collin   COLONOSCOPY WITH PROPOFOL N/A 02/23/2019   Procedure: COLONOSCOPY WITH PROPOFOL;  Surgeon: Toledo, Boykin Nearing, MD;  Location: [facility] ENDOSCOPY;  Service: Endoscopy;  Laterality: N/A;   HAND SURGERY     right hand   spleenectomy  1991   x 2   TOTAL HIP ARTHROPLASTY Right 11/28/2013   Procedure: TOTAL HIP ARTHROPLASTY ANTERIOR APPROACH;  Surgeon: Harvie Junior, MD;  Location: [facility] OR;  Service: Orthopedics;  Laterality: Right;  Right total hip arthroplasty anterior approach   TOTAL HIP ARTHROPLASTY Left 05/15/2014   Procedure: TOTAL HIP ARTHROPLASTY ANTERIOR APPROACH;  Surgeon: Harvie Junior, MD;  Location: [facility] OR;  Service: Orthopedics;  Laterality: Left;   TUBAL LIGATION      SOCIAL HISTORY: Social History   Socioeconomic History   Marital status: Married    Spouse name: Not on file   Number of children: Not on file   Years of education: Not on file   Highest  education level: Not on file  Occupational History   Not on file  Tobacco Use   Smoking status: Never   Smokeless tobacco: Never  Vaping Use   Vaping status: Never Used  Substance and Sexual Activity   Alcohol use: Yes    Comment: occasionally   Drug use: No   Sexual activity: Yes    Partners: Male    Birth control/protection: Post-menopausal   Other Topics Concern   Not on file  Social History Narrative   Not on file   Social Determinants of Health   Financial Resource Strain: Not on file  Food Insecurity: Not on file  Transportation Needs: Not on file  Physical Activity: Not on file  Stress: Not on file  Social Connections: Not on file  Intimate Partner Violence: Not on file    FAMILY HISTORY: Family History  Problem Relation Age of Onset   Lung disease Mother    Breast cancer Paternal Aunt 75       x 2 (first time late 62s), same breast   Prostate cancer Paternal Uncle 31   Bone cancer Paternal Uncle 32   Brain cancer Paternal Uncle 75   Lung cancer Paternal Uncle 7   Asthma Father     ALLERGIES:  is allergic to nsaids, aspirin, and citrus.  MEDICATIONS:  Current Outpatient Medications  Medication Sig Dispense Refill   acetaminophen (TYLENOL) 650 MG CR tablet Take 650 mg by mouth 2 (two) times daily as needed for pain.     albuterol (VENTOLIN HFA) 108 (90 Base) MCG/ACT inhaler Inhale 1-2 puffs into the lungs every 4 (four) hours as needed for wheezing or shortness of breath. 1 each 0   buPROPion (WELLBUTRIN SR) 150 MG 12 hr tablet Take 150 mg by mouth daily.     Calcium Carbonate-Vitamin D (CALCIUM + D PO) Take 1 tablet by mouth daily.     carboxymethylcellulose (REFRESH PLUS) 0.5 % SOLN Apply to eye.     Cetirizine HCl 10 MG CAPS Take 1 capsule by mouth as needed. Seasonal allergies     ELDERBERRY PO Take by mouth.     eltrombopag (PROMACTA) 50 MG tablet TAKE 1 TABLET BY MOUTH ONCE DAILY ON AN EMPTY STOMACH  AT LEAST 1 HOUR BEFORE OR 2 HOURS AFTER A MEAL 30 tablet 6   furosemide (LASIX) 40 MG tablet Take 40 mg by mouth daily.     lovastatin (MEVACOR) 10 MG tablet Take 10 mg by mouth daily.     Multiple Vitamin (MULTIVITAMIN WITH MINERALS) TABS tablet Take 1 tablet by mouth daily.     mupirocin ointment (BACTROBAN) 2 % Apply 1 Application topically 2 (two) times daily. 22 g 0   Naphazoline HCl (CLEAR EYES  OP) Place 1 drop into both eyes daily.     NON FORMULARY Use as directed 1 scoop in the mouth or throat daily. IT WORKS! (The Greens) pt mixes in water or fruit juice     Spacer/Aero-Holding Chambers (AEROCHAMBER MV) inhaler Use as instructed 1 each 1   Vitamin D, Ergocalciferol, (DRISDOL) 1.25 MG (50000 UNIT) CAPS capsule TAKE 1 CAPSULE BY MOUTH ONCE  WEEKLY 16 capsule 3   fluticasone (FLONASE) 50 MCG/ACT nasal spray Place 2 sprays into both nostrils daily. (Patient not taking: Reported on 05/04/2023) 16 g 0   Melatonin 10 MG CAPS Take 10 mg by mouth at bedtime as needed.  (Patient not taking: Reported on 05/04/2023)     No current facility-administered medications for this  visit.     PHYSICAL EXAMINATION: ECOG PERFORMANCE STATUS: 0 - Asymptomatic  Vitals:   05/04/23 1548  BP: 126/76  Pulse: 75  Resp: 20  Temp: 97.6 F (36.4 C)  SpO2: 100%   Filed Weights   05/04/23 1548  Weight: 232 lb 3.2 oz (105.3 kg)   Physical Exam Vitals reviewed.  Constitutional:      Appearance: She is not ill-appearing.  Cardiovascular:     Rate and Rhythm: Normal rate and regular rhythm.  Pulmonary:     Effort: Pulmonary effort is normal. No respiratory distress.  Abdominal:     General: There is no distension.     Palpations: Abdomen is soft.     Tenderness: There is no abdominal tenderness. There is no guarding.  Musculoskeletal:     Right lower leg: Edema present.     Left lower leg: Edema present.  Skin:    General: Skin is warm.  Neurological:     Mental Status: She is alert and oriented to person, place, and time.  Psychiatric:        Mood and Affect: Mood and affect normal.        Behavior: Behavior normal.    LABORATORY DATA:  I have reviewed the data as listed Lab Results  Component Value Date   WBC 6.1 05/04/2023   HGB 12.8 05/04/2023   HCT 37.1 05/04/2023   MCV 88.5 05/04/2023   PLT 73 (L) 05/04/2023   Recent Labs    05/09/22 1446 11/07/22 1111 05/04/23 1538  NA  136 138 138  K 3.7 3.4* 3.7  CL 109 103 102  CO2 25 28 27   GLUCOSE 109* 83 114*  BUN 18 21 17   CREATININE 1.17* 1.30* 1.29*  CALCIUM 9.3 9.6 9.8  GFRNONAA 52* 46* 46*  PROT 7.4 7.9 7.5  ALBUMIN 4.0 4.5 4.4  AST 30 32 30  ALT 29 34 26  ALKPHOS 73 78 79  BILITOT 0.7 0.5 0.5    RADIOGRAPHIC STUDIES: I have personally reviewed the radiological images as listed and agreed with the findings in the report. No results found.  ASSESSMENT & PLAN:   No problem-specific Assessment & Plan notes found for this encounter.  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 On Promacta since October 2016. Currently on Promacta 50 mg twice a week. Tolerating well. Platelets 73. Stable. Continue current dosing.  Shoulder surgery- reviewed that surgery may request specific platelet count prior to surgery. May need to adjust promacta or consider pre-op steroids. Surgery has not yet been scheduled. Patient will contact clinic once details available.  CKD- stage III. GFR 46. Followed by Dr Suezanne Jacquet. Stable.  BLE edema- improved with lasix, elevation, and compression.    DISPOSITION:   6 mo- lab (cbc, cmp), Dr Donneta Romberg- [place]    All questions were answered. The patient knows to call the clinic with any problems, questions or concerns.   Alinda Dooms, NP 05/04/2023   CC: Dr.Thies

## 2023-05-08 ENCOUNTER — Telehealth: Payer: Self-pay | Admitting: Internal Medicine

## 2023-05-08 NOTE — Telephone Encounter (Signed)
Patient mostly completed from the September 73.  Patient on Promacta 50 mg once a week.  Recommend Promacta every day for the next 2 weeks-repeat CBC and if platelets greater than 100 will sign clearance for orthopedic surgery.

## 2023-05-11 ENCOUNTER — Encounter: Payer: Self-pay | Admitting: *Deleted

## 2023-05-12 ENCOUNTER — Telehealth: Payer: Self-pay

## 2023-05-12 NOTE — Telephone Encounter (Signed)
[facility] called stating they was reaching out for the surgrey clearance faxed over on 05/05/23. I did give her our fax number again to refax the form. Call back # [phone].

## 2023-05-19 ENCOUNTER — Other Ambulatory Visit: Payer: Self-pay | Admitting: Obstetrics and Gynecology

## 2023-05-19 ENCOUNTER — Ambulatory Visit: Payer: 59 | Admitting: Obstetrics and Gynecology

## 2023-05-19 ENCOUNTER — Encounter: Payer: Self-pay | Admitting: Obstetrics and Gynecology

## 2023-05-19 ENCOUNTER — Other Ambulatory Visit (HOSPITAL_COMMUNITY)
Admission: RE | Admit: 2023-05-19 | Discharge: 2023-05-19 | Disposition: A | Discharge status: Home / Self Care | Payer: 59 | Source: Ambulatory Visit | Attending: Obstetrics and Gynecology | Admitting: Obstetrics and Gynecology

## 2023-05-19 VITALS — BP 127/74 | HR 66 | Ht 65.0 in | Wt 239.0 lb

## 2023-05-19 DIAGNOSIS — Z124 Encounter for screening for malignant neoplasm of cervix: Secondary | ICD-10-CM | POA: Insufficient documentation

## 2023-05-19 DIAGNOSIS — Z01419 Encounter for gynecological examination (general) (routine) without abnormal findings: Secondary | ICD-10-CM | POA: Diagnosis not present

## 2023-05-19 DIAGNOSIS — Z1231 Encounter for screening mammogram for malignant neoplasm of breast: Secondary | ICD-10-CM

## 2023-05-19 DIAGNOSIS — R921 Mammographic calcification found on diagnostic imaging of breast: Secondary | ICD-10-CM

## 2023-05-19 DIAGNOSIS — Z13820 Encounter for screening for osteoporosis: Secondary | ICD-10-CM

## 2023-05-19 DIAGNOSIS — Z1151 Encounter for screening for human papillomavirus (HPV): Secondary | ICD-10-CM

## 2023-05-19 DIAGNOSIS — R928 Other abnormal and inconclusive findings on diagnostic imaging of breast: Secondary | ICD-10-CM

## 2023-05-19 NOTE — Progress Notes (Signed)
PCP: Pcp, No   Chief Complaint  Patient presents with   Gynecologic Exam    No concerns    HPI:      Victoria Morgan is a 67 y.o. No obstetric history on file. who LMP was No LMP recorded. Patient is postmenopausal., presents today for her annual examination.  Her menses are absent and she is postmenopausal. She does not have PMB. She has tolerable vasomotor sx/night sweats, sometimes worse than others.   Sex activity: single partner, contraception - post menopausal status. She does have vaginal dryness. Uses lubricants prn with sx relief.  Last Pap: 03/15/20 Results were: no abnormalities /neg HPV DNA.  Hx of STDs: none  Last mammogram: 05/16/22  Results were Cat 3 RT breast calcifications; f/u imaging 4/24 WNL; dx mammo due 9/24.  There is a FH of breast cancer in her pat aunt twice and prostate cancer in her pat uncle. There is no FH of ovarian cancer. The patient does self-breast exams. IBIS=19.9%. MyRisk neg except AXIN2/RNF43 VUS 1/20  Colonoscopy: colonoscopy 7/20 with Dr. Norma Fredrickson, without abnormalities. Repeat due after 10 years.  DEXA: not recent  Tobacco use: The patient denies current or previous tobacco use. Alcohol use: social drinker  No drug use Exercise: min active  She does get adequate calcium and Vitamin D in her diet.  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 Mechele Collin   COLONOSCOPY WITH PROPOFOL N/A 02/23/2019   Procedure: COLONOSCOPY WITH PROPOFOL;  Surgeon: Toledo,  Boykin Nearing, MD;  Location: [facility] END[facility]COPY;  Service: Endoscopy;  Laterality: N/A;   HAND SURGERY     right hand   spleenectomy  1991   x 2   TOTAL HIP ARTHROPLASTY Right 11/28/2013   Procedure: TOTAL HIP ARTHROPLASTY ANTERIOR APPROACH;  Surgeon: Harvie Junior, MD;  Location: [facility] OR;  Service: Orthopedics;  Laterality: Right;  Right total hip arthroplasty anterior approach   TOTAL HIP ARTHROPLASTY Left 05/15/2014   Procedure: TOTAL HIP ARTHROPLASTY ANTERIOR APPROACH;  Surgeon: Harvie Junior, MD;  Location: [facility] OR;  Service: Orthopedics;  Laterality: Left;   TUBAL LIGATION      Family History  Problem Relation Age of Onset   Lung disease Mother    Breast cancer Paternal Aunt 56       x 2 (first time late 43s), same breast   Prostate cancer Paternal Uncle 68   Bone cancer Paternal Uncle 101   Brain cancer Paternal Uncle 75   Lung cancer Paternal Uncle 14   Asthma Father     Social History   Socioeconomic History   Marital status: Married    Spouse name: Not on file   Number of children: Not on file   Years of education: Not on file   Highest education level: Not on file  Occupational History   Not on file  Tobacco Use   Smoking status: Never  Smokeless tobacco: Never  Vaping Use   Vaping status: Never Used  Substance and Sexual Activity   Alcohol use: Yes    Comment: occasionally   Drug use: No   Sexual activity: Yes    Partners: Male    Birth control/protection: Post-menopausal  Other Topics Concern   Not on file  Social History Narrative   Not on file   Social Determinants of Health   Financial Resource Strain: Not on file  Food Insecurity: Not on file  Transportation Needs: Not on file  Physical Activity: Not on file  Stress: Not on file  Social Connections: Not on file  Intimate Partner Violence: Not on file    Current Meds  Medication Sig   acetaminophen (TYLENOL) 650 MG CR tablet Take 650 mg by mouth 2 (two) times daily as needed for pain.   albuterol  (VENTOLIN HFA) 108 (90 Base) MCG/ACT inhaler Inhale 1-2 puffs into the lungs every 4 (four) hours as needed for wheezing or shortness of breath.   buPROPion (WELLBUTRIN SR) 150 MG 12 hr tablet Take 150 mg by mouth daily.   Calcium Carbonate-Vitamin D (CALCIUM + D PO) Take 1 tablet by mouth daily.   carboxymethylcellulose (REFRESH PLUS) 0.5 % SOLN Apply to eye.   Cetirizine HCl 10 MG CAPS Take 1 capsule by mouth as needed. Seasonal allergies   ELDERBERRY PO Take by mouth.   eltrombopag (PROMACTA) 50 MG tablet TAKE 1 TABLET BY MOUTH ONCE DAILY ON AN EMPTY STOMACH  AT LEAST 1 HOUR BEFORE OR 2 HOURS AFTER A MEAL   fluticasone (FLONASE) 50 MCG/ACT nasal spray Place 2 sprays into both nostrils daily.   furosemide (LASIX) 40 MG tablet Take 40 mg by mouth daily.   lovastatin (MEVACOR) 10 MG tablet Take 10 mg by mouth daily.   Multiple Vitamin (MULTIVITAMIN WITH MINERALS) TABS tablet Take 1 tablet by mouth daily.   mupirocin ointment (BACTROBAN) 2 % Apply 1 Application topically 2 (two) times daily.   Naphazoline HCl (CLEAR EYES OP) Place 1 drop into both eyes daily.   NON FORMULARY Use as directed 1 scoop in the mouth or throat daily. IT WORKS! (The Greens) pt mixes in water or fruit juice   Spacer/Aero-Holding Chambers (AEROCHAMBER MV) inhaler Use as instructed   Vitamin D, Ergocalciferol, (DRISDOL) 1.25 MG (50000 UNIT) CAPS capsule TAKE 1 CAPSULE BY MOUTH ONCE  WEEKLY      ROS:  Review of Systems  Constitutional:  Negative for fatigue, fever and unexpected weight change.  Respiratory:  Negative for cough, shortness of breath and wheezing.   Cardiovascular:  Negative for chest pain, palpitations and leg swelling.  Gastrointestinal:  Negative for blood in stool, constipation, diarrhea, nausea and vomiting.  Endocrine: Negative for cold intolerance, heat intolerance and polyuria.  Genitourinary:  Negative for dyspareunia, dysuria, flank pain, frequency, genital sores, hematuria, menstrual problem,  pelvic pain, urgency, vaginal bleeding, vaginal discharge and vaginal pain.  Musculoskeletal:  Positive for arthralgias. Negative for back pain, joint swelling and myalgias.  Skin:  Negative for rash.  Neurological:  Positive for dizziness. Negative for syncope, light-headedness, numbness and headaches.  Hematological:  Negative for adenopathy.  Psychiatric/Behavioral:  Negative for agitation, confusion, sleep disturbance and suicidal ideas. The patient is not nervous/anxious.      Objective: BP 127/74   Pulse 66   Ht 5\' 5"  (1.651 m)   Wt 239 lb (108.4 kg)   BMI 39.77 kg/m    Physical Exam Constitutional:  Appearance: She is well-developed.  Genitourinary:     Vulva normal.     Right Labia: No rash, tenderness or lesions.    Left Labia: No tenderness, lesions or rash.    No vaginal discharge, erythema or tenderness.      Right Adnexa: not tender and no mass present.    Left Adnexa: not tender and no mass present.    No cervical friability or polyp.     Uterus is not enlarged or tender.  Breasts:    Right: No mass, nipple discharge, skin change or tenderness.     Left: No mass, nipple discharge, skin change or tenderness.  Neck:     Thyroid: No thyromegaly.  Cardiovascular:     Rate and Rhythm: Normal rate and regular rhythm.     Heart sounds: Normal heart sounds. No murmur heard. Pulmonary:     Effort: Pulmonary effort is normal.     Breath sounds: Normal breath sounds.  Abdominal:     Palpations: Abdomen is soft.     Tenderness: There is no abdominal tenderness. There is no guarding or rebound.  Musculoskeletal:        General: Normal range of motion.     Cervical back: Normal range of motion.  Lymphadenopathy:     Cervical: No cervical adenopathy.  Neurological:     General: No focal deficit present.     Mental Status: She is alert and oriented to person, place, and time.     Cranial Nerves: No cranial nerve deficit.  Skin:    General: Skin is warm and  dry.  Psychiatric:        Mood and Affect: Mood normal.        Behavior: Behavior normal.        Thought Content: Thought content normal.        Judgment: Judgment normal.  Vitals reviewed.     Assessment/Plan: Encounter for annual routine gynecological examination  Cervical cancer screening - Plan: Cytology - PAP  Screening for HPV (human papillomavirus) - Plan: Cytology - PAP  Encounter for screening mammogram for malignant neoplasm of breast - Plan: MM 3D DIAGNOSTIC MAMMOGRAM BILATERAL BREAST; pt to schedule mammo  Abnormal mammogram of right breast - Plan: MM 3D DIAGNOSTIC MAMMOGRAM BILATERAL BREAST  Screening for osteoporosis - Plan: DG Bone Density; pt to schedule DEXA.          GYN counsel breast self exam, mammography screening, menopause, adequate intake of calcium and vitamin D, diet and exercise    F/U  Return in about 1 year (around 05/18/2024).  Adedamola Seto B. Sequoyah Counterman, PA-C 05/19/2023 4:23 PM

## 2023-05-19 NOTE — Patient Instructions (Signed)
I value your feedback and you entrusting us with your care. If you get a [facility] patient survey, I would appreciate you taking the time to let us know about your experience today. Thank you!  [facility] ([place])--[phone]  

## 2023-05-22 LAB — CYTOLOGY - PAP
Comment: NEGATIVE
Diagnosis: NEGATIVE
High risk HPV: NEGATIVE

## 2023-05-29 ENCOUNTER — Telehealth: Payer: Self-pay | Admitting: Internal Medicine

## 2023-05-29 NOTE — Telephone Encounter (Signed)
Patient said surgery is scheduled for Nov 13 and she was told to call for appointments when she know date. Please advise.

## 2023-06-01 ENCOUNTER — Other Ambulatory Visit: Payer: Self-pay | Admitting: *Deleted

## 2023-06-01 ENCOUNTER — Encounter: Payer: Self-pay | Admitting: *Deleted

## 2023-06-01 DIAGNOSIS — D693 Immune thrombocytopenic purpura: Secondary | ICD-10-CM

## 2023-06-04 ENCOUNTER — Other Ambulatory Visit: Payer: Self-pay | Admitting: Internal Medicine

## 2023-06-16 ENCOUNTER — Inpatient Hospital Stay: Payer: 59 | Attending: Internal Medicine

## 2023-06-16 DIAGNOSIS — Q8901 Asplenia (congenital): Secondary | ICD-10-CM | POA: Insufficient documentation

## 2023-06-16 DIAGNOSIS — I890 Lymphedema, not elsewhere classified: Secondary | ICD-10-CM | POA: Insufficient documentation

## 2023-06-16 DIAGNOSIS — D693 Immune thrombocytopenic purpura: Secondary | ICD-10-CM | POA: Diagnosis present

## 2023-06-16 DIAGNOSIS — N183 Chronic kidney disease, stage 3 unspecified: Secondary | ICD-10-CM | POA: Diagnosis not present

## 2023-06-16 LAB — CBC WITH DIFFERENTIAL (CANCER CENTER ONLY)
Abs Immature Granulocytes: 0.03 10*3/uL (ref 0.00–0.07)
Basophils Absolute: 0.1 10*3/uL (ref 0.0–0.1)
Basophils Relative: 1 %
Eosinophils Absolute: 0.3 10*3/uL (ref 0.0–0.5)
Eosinophils Relative: 5 %
HCT: 37.4 % (ref 36.0–46.0)
Hemoglobin: 12.8 g/dL (ref 12.0–15.0)
Immature Granulocytes: 0 %
Lymphocytes Relative: 18 %
Lymphs Abs: 1.2 10*3/uL (ref 0.7–4.0)
MCH: 30.3 pg (ref 26.0–34.0)
MCHC: 34.2 g/dL (ref 30.0–36.0)
MCV: 88.6 fL (ref 80.0–100.0)
Monocytes Absolute: 0.7 10*3/uL (ref 0.1–1.0)
Monocytes Relative: 11 %
Neutro Abs: 4.5 10*3/uL (ref 1.7–7.7)
Neutrophils Relative %: 65 %
Platelet Count: 494 10*3/uL — ABNORMAL HIGH (ref 150–400)
RBC: 4.22 MIL/uL (ref 3.87–5.11)
RDW: 13.6 % (ref 11.5–15.5)
WBC Count: 6.9 10*3/uL (ref 4.0–10.5)
nRBC: 0.4 % — ABNORMAL HIGH (ref 0.0–0.2)

## 2023-06-17 ENCOUNTER — Other Ambulatory Visit: Payer: 59

## 2023-06-17 ENCOUNTER — Inpatient Hospital Stay: Payer: 59

## 2023-08-06 ENCOUNTER — Other Ambulatory Visit: Payer: 59

## 2023-08-07 ENCOUNTER — Ambulatory Visit
Admission: RE | Admit: 2023-08-07 | Discharge: 2023-08-07 | Disposition: A | Discharge status: Home / Self Care | Payer: 59 | Source: Ambulatory Visit | Attending: Obstetrics and Gynecology

## 2023-08-07 ENCOUNTER — Ambulatory Visit
Admission: RE | Admit: 2023-08-07 | Discharge: 2023-08-07 | Disposition: A | Discharge status: Home / Self Care | Payer: 59 | Source: Ambulatory Visit | Attending: Obstetrics and Gynecology | Admitting: Obstetrics and Gynecology

## 2023-08-07 DIAGNOSIS — Z1231 Encounter for screening mammogram for malignant neoplasm of breast: Secondary | ICD-10-CM | POA: Diagnosis present

## 2023-08-07 DIAGNOSIS — R921 Mammographic calcification found on diagnostic imaging of breast: Secondary | ICD-10-CM | POA: Diagnosis present

## 2023-08-07 DIAGNOSIS — Z13820 Encounter for screening for osteoporosis: Secondary | ICD-10-CM | POA: Insufficient documentation

## 2023-09-07 ENCOUNTER — Other Ambulatory Visit (HOSPITAL_COMMUNITY): Payer: Self-pay

## 2023-09-07 ENCOUNTER — Telehealth: Payer: Self-pay

## 2023-09-07 NOTE — Telephone Encounter (Signed)
[name] Encounter  Re-authorization   Received notification that prior authorization for Promacta is due for renewal.   PA submitted on 09/07/23  Key [id number]  Status is pending     Ardeen Fillers, CPhT Oncology Pharmacy Patient Advocate  [facility]  [phone] (phone) [phone] (fax) 09/07/2023 3:12 PM

## 2023-09-08 ENCOUNTER — Telehealth: Payer: Self-pay | Admitting: *Deleted

## 2023-09-08 NOTE — Telephone Encounter (Signed)
Proceed call from [employer] stating that Promacta was not approved for this patient's care.  In there was another med Malva Cogan and it was also denied but it did say that it can be appealed.  [facility] at [phone] states that they have staff RNs that can help with the appeal and make a letter for you just need to have someone from our office to call and give the okay to  help to get medication for the pt., the will do all the work to try to get the medication that she needs.

## 2023-09-09 NOTE — Telephone Encounter (Signed)
[name] Encounter  Received notification that the request for prior authorization for Promacta has been denied due to "Patient needs to try Dexamethasone."   It was put in the original Prior Auth that patient has tried and failed corticosteroids. Appeal will be started.       Ardeen Fillers, CPhT Oncology Pharmacy Patient Advocate  [facility]  [phone] (phone) [phone] (fax) 09/09/2023 9:45 AM

## 2023-09-16 ENCOUNTER — Telehealth: Payer: Self-pay | Admitting: Pharmacist

## 2023-09-16 ENCOUNTER — Other Ambulatory Visit (HOSPITAL_COMMUNITY): Payer: Self-pay

## 2023-09-16 DIAGNOSIS — D693 Immune thrombocytopenic purpura: Secondary | ICD-10-CM

## 2023-09-16 MED ORDER — ALVAIZ 36 MG PO TABS: 1.0000 | ORAL_TABLET | ORAL | 6 refills | Status: DC

## 2023-09-16 NOTE — Telephone Encounter (Signed)
 Clinical Pharmacist Practitioner Encounter   Patient's insurance now prefers Alvaiz  (eltrombopag ) instead of Promacta  (eltrombopag ). They have denied coverage for Promacta .   Patient will now need to take Alvaiz  (eltrombopag )  for the treatment of ITP, planned duration until disease progression or unacceptable drug toxicity.  CMP (Care Everywhere) from 08/20/23 assessed, no relevant lab abnormalities. Prescription dose and frequency assessed.   Current medication list in Epic reviewed, several DDIs with eltrombopag  identified: Calcium carbonate and multivitamin: polyvalent cation containing products may decrease the serum concentration of eltrombopag . Administer eltrombopag  at least 2 hours before or 4 hours after oral administration of any polyvalent cation containing product Lovastatin: Eltrombopag  may increase the serum concentration of lovastatin. Monitor patient closely for adverse effects of lovastatin   Caitlain Tweed N. Jordanny Waddington, PharmD, BCOP, CPP Hematology/Oncology Clinical Pharmacist [facility] Oral Chemotherapy Navigation [facility] [phone]  09/16/2023 2:01 PM

## 2023-09-16 NOTE — Telephone Encounter (Signed)
 Spoke with patient about change from Promacta  to Alvaiz  bioequivalent based on insurance plan. Confirmed she has been following twice weekly dosing schedule. She was made aware of the changes to her prescription. All questions were answered to patient's satisfaction.   Corean Sever, PharmD PGY2 Oncology Pharmacy Resident   09/16/2023 4:24 PM

## 2023-09-16 NOTE — Telephone Encounter (Signed)
 Clinical Pharmacist Practitioner Encounter   New authorization  Prior Authorization for Alvaiz  (eltrombopag ) has been approved.     PA Case ID #: [id number] Effective dates: 09/16/23 through 09/17/23  Patient required to fill at [facility]   Oral Oncology Clinic will continue to follow.   Arcola Freshour N. Ebon Ketchum, PharmD, BCOP, CPP Hematology/Oncology Clinical Pharmacist [facility] Oral Chemotherapy [facility] [phone]  09/16/2023 1:14 PM

## 2023-09-16 NOTE — Telephone Encounter (Signed)
 Clinical Pharmacist Practitioner Encounter  Re-authorization- PA resubmitted to plan  Received notification from [employer] that prior authorization for Promacta  is required.   PA submitted on CMM Key [id number] Status is pending   Oral Oncology Clinic will continue to follow.   Kourosh Jablonsky N. Myrta Mercer, PharmD, BCOP, CPP Hematology/Oncology Clinical Pharmacist [facility] Oral Chemotherapy Navigation [facility] [phone]  09/16/2023 10:27 AM

## 2023-09-16 NOTE — Telephone Encounter (Signed)
 Insurance denied Promacta  again. Ben called [employer] and they reported their preferred product is Alvaiz (eltrombopag ). PA for Harlan Liberty will be submitted.

## 2023-09-16 NOTE — Telephone Encounter (Signed)
 Clinical Pharmacist Practitioner Encounter  New authorization  Received notification from [facility] that prior authorization for Alvaiz  (eltrombopag ) is required.   PA submitted on CMM Key [facility] Status is pending   Oral Oncology Clinic will continue to follow.   [name] N. Iridian Reader, PharmD, BCOP, CPP Hematology/Oncology Clinical Pharmacist [facility] Oral Chemotherapy [facility] [phone]  09/16/2023 1:12 PM

## 2023-11-02 ENCOUNTER — Inpatient Hospital Stay: Payer: 59 | Attending: Internal Medicine

## 2023-11-02 DIAGNOSIS — D693 Immune thrombocytopenic purpura: Secondary | ICD-10-CM | POA: Diagnosis present

## 2023-11-02 DIAGNOSIS — Z79899 Other long term (current) drug therapy: Secondary | ICD-10-CM

## 2023-11-02 LAB — CMP (CANCER CENTER ONLY)
ALT: 22 U/L (ref 0–44)
AST: 26 U/L (ref 15–41)
Albumin: 3.9 g/dL (ref 3.5–5.0)
Alkaline Phosphatase: 76 U/L (ref 38–126)
Anion gap: 7 (ref 5–15)
BUN: 14 mg/dL (ref 8–23)
CO2: 25 mmol/L (ref 22–32)
Calcium: 9.4 mg/dL (ref 8.9–10.3)
Chloride: 105 mmol/L (ref 98–111)
Creatinine: 1.12 mg/dL — ABNORMAL HIGH (ref 0.44–1.00)
GFR, Estimated: 55 mL/min — ABNORMAL LOW (ref >60)
Glucose, Bld: 102 mg/dL — ABNORMAL HIGH (ref 70–99)
Potassium: 3.8 mmol/L (ref 3.5–5.1)
Sodium: 137 mmol/L (ref 135–145)
Total Bilirubin: 0.4 mg/dL (ref 0.0–1.2)
Total Protein: 7.2 g/dL (ref 6.5–8.1)

## 2023-11-02 LAB — CBC WITH DIFFERENTIAL (CANCER CENTER ONLY)
Abs Immature Granulocytes: 0.02 10*3/uL (ref 0.00–0.07)
Basophils Absolute: 0.1 10*3/uL (ref 0.0–0.1)
Basophils Relative: 1 %
Eosinophils Absolute: 0.4 10*3/uL (ref 0.0–0.5)
Eosinophils Relative: 8 %
HCT: 36.8 % (ref 36.0–46.0)
Hemoglobin: 12.6 g/dL (ref 12.0–15.0)
Immature Granulocytes: 0 %
Lymphocytes Relative: 30 %
Lymphs Abs: 1.5 10*3/uL (ref 0.7–4.0)
MCH: 29.9 pg (ref 26.0–34.0)
MCHC: 34.2 g/dL (ref 30.0–36.0)
MCV: 87.2 fL (ref 80.0–100.0)
Monocytes Absolute: 0.6 10*3/uL (ref 0.1–1.0)
Monocytes Relative: 12 %
Neutro Abs: 2.4 10*3/uL (ref 1.7–7.7)
Neutrophils Relative %: 49 %
Platelet Count: 158 10*3/uL (ref 150–400)
RBC: 4.22 MIL/uL (ref 3.87–5.11)
RDW: 14.3 % (ref 11.5–15.5)
WBC Count: 5 10*3/uL (ref 4.0–10.5)
nRBC: 0 % (ref 0.0–0.2)

## 2023-11-03 ENCOUNTER — Encounter: Payer: Self-pay | Admitting: Internal Medicine

## 2023-11-03 ENCOUNTER — Inpatient Hospital Stay: Payer: 59 | Admitting: Internal Medicine

## 2023-11-03 DIAGNOSIS — D693 Immune thrombocytopenic purpura: Secondary | ICD-10-CM | POA: Diagnosis not present

## 2023-11-03 NOTE — Progress Notes (Signed)
 I connected with Victoria Morgan on 11/03/23 at  3:30 PM EDT by video enabled telemedicine visit and verified that I am speaking with the correct person using two identifiers.  I discussed the limitations, risks, security and privacy concerns of performing an evaluation and management service by telemedicine and the availability of in-person appointments. I also discussed with the patient that there may be a patient responsible charge related to this service. The patient expressed understanding and agreed to proceed.    Other persons participating in the visit and their role in the encounter: RN/medical reconciliation Patient's location: home Provider's location: office  Oncology History   No history exists.     Chief Complaint: ITP   History of present illness:Victoria Morgan 68 y.o.  female  African-American female patient with a history of chronic refractory ITP currently on Promacta 50 mg twice a week /CKD- III is here for follow-up.   Patient denies any blood in stools or black-colored stools.  Denies any easy bruising or bleeding.  Patient is currently taking Promacta twice daily.  She is currently awaiting switching over to [employer] as per insurance.  Patient interested in going on GLP agonist for her weight loss.  Observation/objective: Alert & oriented x 3. In No acute distress.   Assessment and plan: Idiopathic thrombocytopenic purpura (HCC) Chronic refractory ITP status post multiple lines of therapy; steroid responsive but intolerant. on Promacta since October 2016. Patient currently on Promacta 50 mg twice a week.   # Platelet count is  152  continue for now. Per insurance plan to switch to [employer] eltrombobag]- i we will repeat the blood work in approximately 1 month after starting avalize.  patient will call us to inform us regarding timing/repeating the blood work.  #  CKD- stage III- [GFR- 53]- STABLE [Dr.Korrapati]-  stable.    #  Lymphedema BIL LE [on lasix per nephro]; recommend compression stockings leg elevation- stable.    # Vaccination: ok with meningitis vaccination- as per PCP.   # Obesity: ok with GLP-1 agonsits-   DISPOSITION:   # follow up in 6 months-MD; labs cbc/cmp- Dr.B   Cc; Dr.Thies.     Follow-up instructions:  I discussed the assessment and treatment plan with the patient.  The patient was provided an opportunity to ask questions and all were answered.  The patient agreed with the plan and demonstrated understanding of instructions.  The patient was advised to call back or seek an in person evaluation if the symptoms worsen or if the condition fails to improve as anticipated.    Dr. Louretta Shorten [facility] at [facility] 11/03/2023 9:15 PM

## 2023-11-03 NOTE — Progress Notes (Signed)
 Pt states she is on promacta, not sure of the dose, not on med list.   She would like to know when her last meningitis vaccine was given? Last documented was 12/10/2014.  Pt would like to know if you recommend ozempic or wegovy for weight lose with her taking promacta?

## 2023-11-03 NOTE — Assessment & Plan Note (Addendum)
 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 on Promacta since October 2016. Patient currently on Promacta 50 mg twice a week.   # Platelet count is  152  continue for now. Per insurance plan to switch to [employer] eltrombobag]- i we will repeat the blood work in approximately 1 month after starting avalize.  patient will call us to inform us regarding timing/repeating the blood work.  #  CKD- stage III- [GFR- 53]- STABLE [Dr.Korrapati]-  stable.    # Lymphedema BIL LE [on lasix per nephro]; recommend compression stockings leg elevation- stable.    # Vaccination: ok with meningitis vaccination- as per PCP.   # Obesity: ok with GLP-1 agonsits-   DISPOSITION:   # follow up in 6 months-MD; labs cbc/cmp- Dr.B   Cc; Dr.Thies.

## 2023-12-12 ENCOUNTER — Other Ambulatory Visit: Payer: Self-pay | Admitting: Nurse Practitioner

## 2024-02-10 ENCOUNTER — Telehealth: Payer: Self-pay

## 2024-02-10 NOTE — Telephone Encounter (Signed)
 Patient requesting recommendations for OTC meds for hot flashes. She doesn't want to take rx. Advised black cohosh, St John's Wort, Ginseng. Will send message to [facility] for review. Patient aware Bernarda is out of the office until next week.

## 2024-02-12 NOTE — Telephone Encounter (Signed)
 Pt can try estroven over the counter. Will take a few months to help; HOWEVER, at her age, her symptoms may be more related to her kidney disease and not hormonal, so if that's the case, stroven won't help. Not a candidate for HRT at her age.

## 2024-02-15 ENCOUNTER — Telehealth: Payer: Self-pay

## 2024-02-15 NOTE — Telephone Encounter (Signed)
 Pt calling back. You left her message. Thx.

## 2024-02-15 NOTE — Telephone Encounter (Signed)
 Patient called and she wants to know what you recommend for her menopausal symptoms. She is having hot flashes, insomnia, mood swings and wait gain. She wants to know if there is anything you can prescribe her or anything she can take OTC. I informed patient that this may require an office visit, but I was going to still send message. She verbalized understanding. Please advise.

## 2024-02-15 NOTE — Telephone Encounter (Signed)
 Pt called back and is aware

## 2024-02-15 NOTE — Telephone Encounter (Signed)
 Taken care of in separate encounter

## 2024-02-15 NOTE — Telephone Encounter (Signed)
 Called pt, no answer, LVMTRC.

## 2024-02-25 ENCOUNTER — Telehealth: Payer: Self-pay | Admitting: *Deleted

## 2024-02-25 ENCOUNTER — Other Ambulatory Visit: Payer: Self-pay | Admitting: *Deleted

## 2024-02-25 DIAGNOSIS — D693 Immune thrombocytopenic purpura: Secondary | ICD-10-CM

## 2024-02-25 NOTE — Telephone Encounter (Signed)
 Would like to have a lab done today tomorrow she is starting on a new med and she wants to make sure what the platelets are doing before she starts on this new med.  Please call back and give her a date and time if that is what Dr. Rennie is going to do,

## 2024-02-26 ENCOUNTER — Other Ambulatory Visit

## 2024-02-29 ENCOUNTER — Inpatient Hospital Stay: Attending: Internal Medicine

## 2024-02-29 ENCOUNTER — Encounter: Payer: Self-pay | Admitting: *Deleted

## 2024-02-29 DIAGNOSIS — D693 Immune thrombocytopenic purpura: Secondary | ICD-10-CM | POA: Insufficient documentation

## 2024-02-29 LAB — CBC WITH DIFFERENTIAL (CANCER CENTER ONLY)
Abs Immature Granulocytes: 0.01 K/uL (ref 0.00–0.07)
Basophils Absolute: 0.1 K/uL (ref 0.0–0.1)
Basophils Relative: 1 %
Eosinophils Absolute: 0.3 K/uL (ref 0.0–0.5)
Eosinophils Relative: 5 %
HCT: 36 % (ref 36.0–46.0)
Hemoglobin: 12.5 g/dL (ref 12.0–15.0)
Immature Granulocytes: 0 %
Lymphocytes Relative: 20 %
Lymphs Abs: 1.2 K/uL (ref 0.7–4.0)
MCH: 30.1 pg (ref 26.0–34.0)
MCHC: 34.7 g/dL (ref 30.0–36.0)
MCV: 86.7 fL (ref 80.0–100.0)
Monocytes Absolute: 0.4 K/uL (ref 0.1–1.0)
Monocytes Relative: 7 %
Neutro Abs: 3.9 K/uL (ref 1.7–7.7)
Neutrophils Relative %: 67 %
Platelet Count: 103 K/uL — ABNORMAL LOW (ref 150–400)
RBC: 4.15 MIL/uL (ref 3.87–5.11)
RDW: 13.6 % (ref 11.5–15.5)
WBC Count: 5.8 K/uL (ref 4.0–10.5)
nRBC: 0 % (ref 0.0–0.2)

## 2024-02-29 LAB — CMP (CANCER CENTER ONLY)
ALT: 26 U/L (ref 0–44)
AST: 31 U/L (ref 15–41)
Albumin: 3.9 g/dL (ref 3.5–5.0)
Alkaline Phosphatase: 79 U/L (ref 38–126)
Anion gap: 6 (ref 5–15)
BUN: 16 mg/dL (ref 8–23)
CO2: 23 mmol/L (ref 22–32)
Calcium: 9.5 mg/dL (ref 8.9–10.3)
Chloride: 110 mmol/L (ref 98–111)
Creatinine: 1.27 mg/dL — ABNORMAL HIGH (ref 0.44–1.00)
GFR, Estimated: 47 mL/min — ABNORMAL LOW (ref >60)
Glucose, Bld: 110 mg/dL — ABNORMAL HIGH (ref 70–99)
Potassium: 3.6 mmol/L (ref 3.5–5.1)
Sodium: 139 mmol/L (ref 135–145)
Total Bilirubin: 0.7 mg/dL (ref 0.0–1.2)
Total Protein: 7.3 g/dL (ref 6.5–8.1)

## 2024-03-27 ENCOUNTER — Other Ambulatory Visit: Payer: Self-pay | Admitting: Medical Genetics

## 2024-04-07 ENCOUNTER — Other Ambulatory Visit
Admission: RE | Admit: 2024-04-07 | Discharge: 2024-04-07 | Disposition: A | Discharge status: Home / Self Care | Payer: Self-pay | Source: Ambulatory Visit | Attending: Medical Genetics | Admitting: Medical Genetics

## 2024-05-02 ENCOUNTER — Other Ambulatory Visit: Payer: Self-pay | Admitting: *Deleted

## 2024-05-02 DIAGNOSIS — D693 Immune thrombocytopenic purpura: Secondary | ICD-10-CM

## 2024-05-03 ENCOUNTER — Other Ambulatory Visit: Payer: Self-pay

## 2024-05-03 ENCOUNTER — Encounter: Payer: Self-pay | Admitting: Nurse Practitioner

## 2024-05-03 ENCOUNTER — Inpatient Hospital Stay: Admitting: Nurse Practitioner

## 2024-05-03 ENCOUNTER — Inpatient Hospital Stay: Attending: Internal Medicine

## 2024-05-03 VITALS — BP 149/80 | HR 72 | Temp 98.7°F | Resp 20 | Wt 234.5 lb

## 2024-05-03 DIAGNOSIS — D693 Immune thrombocytopenic purpura: Secondary | ICD-10-CM | POA: Diagnosis not present

## 2024-05-03 DIAGNOSIS — Z79899 Other long term (current) drug therapy: Secondary | ICD-10-CM

## 2024-05-03 LAB — CMP (CANCER CENTER ONLY)
ALT: 31 U/L (ref 0–44)
AST: 35 U/L (ref 15–41)
Albumin: 3.9 g/dL (ref 3.5–5.0)
Alkaline Phosphatase: 80 U/L (ref 38–126)
Anion gap: 6 (ref 5–15)
BUN: 17 mg/dL (ref 8–23)
CO2: 24 mmol/L (ref 22–32)
Calcium: 9.6 mg/dL (ref 8.9–10.3)
Chloride: 106 mmol/L (ref 98–111)
Creatinine: 1.19 mg/dL — ABNORMAL HIGH (ref 0.44–1.00)
GFR, Estimated: 51 mL/min — ABNORMAL LOW (ref >60)
Glucose, Bld: 94 mg/dL (ref 70–99)
Potassium: 4 mmol/L (ref 3.5–5.1)
Sodium: 136 mmol/L (ref 135–145)
Total Bilirubin: 0.7 mg/dL (ref 0.0–1.2)
Total Protein: 7.2 g/dL (ref 6.5–8.1)

## 2024-05-03 LAB — CBC WITH DIFFERENTIAL (CANCER CENTER ONLY)
Abs Immature Granulocytes: 0.02 K/uL (ref 0.00–0.07)
Basophils Absolute: 0.1 K/uL (ref 0.0–0.1)
Basophils Relative: 1 %
Eosinophils Absolute: 0.3 K/uL (ref 0.0–0.5)
Eosinophils Relative: 5 %
HCT: 36.8 % (ref 36.0–46.0)
Hemoglobin: 12.8 g/dL (ref 12.0–15.0)
Immature Granulocytes: 0 %
Lymphocytes Relative: 25 %
Lymphs Abs: 1.5 K/uL (ref 0.7–4.0)
MCH: 30.1 pg (ref 26.0–34.0)
MCHC: 34.8 g/dL (ref 30.0–36.0)
MCV: 86.6 fL (ref 80.0–100.0)
Monocytes Absolute: 0.6 K/uL (ref 0.1–1.0)
Monocytes Relative: 10 %
Neutro Abs: 3.5 K/uL (ref 1.7–7.7)
Neutrophils Relative %: 59 %
Platelet Count: 53 K/uL — ABNORMAL LOW (ref 150–400)
RBC: 4.25 MIL/uL (ref 3.87–5.11)
RDW: 14 % (ref 11.5–15.5)
WBC Count: 6 K/uL (ref 4.0–10.5)
nRBC: 0 % (ref 0.0–0.2)

## 2024-05-03 MED ORDER — ALVAIZ 36 MG PO TABS: 1.0000 | ORAL_TABLET | ORAL | 2 refills | Status: DC

## 2024-05-03 NOTE — Progress Notes (Signed)
 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    HISTORY OF PRESENTING ILLNESS: Alone. Walking independently.  Victoria Morgan 68 y.o. female African-American female patient with a history of chronic refractory ITP, now on Alvaiz  twice a week who returns to clinic for follow up. Continues to have ankle swelling. Denies easy bruising, unusual rashes, black or bloody stools. Aches and pains are stable.    Review of Systems  Constitutional:  Negative for chills, diaphoresis, fever, malaise/fatigue and weight loss.  HENT:  Negative for nosebleeds and sore throat.   Eyes:  Negative for double vision.  Respiratory:  Negative for cough, hemoptysis, sputum production, shortness of breath and wheezing.   Cardiovascular:  Positive for leg swelling. Negative for chest pain, palpitations and orthopnea.  Gastrointestinal:  Negative for abdominal pain, blood in stool, constipation, diarrhea, heartburn, melena, nausea and vomiting.  Genitourinary:  Negative for dysuria, frequency and urgency.  Musculoskeletal:  Positive for back pain and joint pain.  Skin:  Negative for itching and rash.  Neurological:  Negative for dizziness, tingling, focal weakness, weakness and headaches.  Endo/Heme/Allergies:  Does not bruise/bleed easily.   Psychiatric/Behavioral:  Negative for depression. The patient is not nervous/anxious and does not have insomnia.     MEDICAL HISTORY:  Past Medical History:  Diagnosis Date   Asthma    Blood dyscrasia    ITP   dx in 1990   BRCA negative 08/2018   MyRisk neg except AXIN2/RNF43 VUS   Chronic kidney disease    [facility] at [facility]   Family history of breast cancer 08/2018   IBIS=19.9%   History of arthritis    in hips; now resolved after hip surgery   ITP (idiopathic thrombocytopenic purpura) 05/01/2015    SURGICAL HISTORY: Past Surgical History:  Procedure Laterality Date   BREAST EXCISIONAL BIOPSY Left    benign years ago   COLONOSCOPY  09/18/2008   Dr. Viktoria   COLONOSCOPY WITH PROPOFOL  N/A 02/23/2019   Procedure: COLONOSCOPY WITH PROPOFOL ;  Surgeon: Toledo, Ladell POUR, MD;  Location: [facility] ENDOSCOPY;  Service: Endoscopy;  Laterality: N/A;   HAND SURGERY     right hand   spleenectomy  1991   x 2   TOTAL HIP ARTHROPLASTY Right 11/28/2013   Procedure: TOTAL HIP ARTHROPLASTY ANTERIOR APPROACH;  Surgeon: Norleen LITTIE Gavel, MD;  Location: [facility] OR;  Service: Orthopedics;  Laterality: Right;  Right total hip arthroplasty anterior approach   TOTAL HIP ARTHROPLASTY Left 05/15/2014   Procedure: TOTAL HIP ARTHROPLASTY ANTERIOR APPROACH;  Surgeon: Norleen LITTIE Gavel, MD;  Location: [facility] OR;  Service: Orthopedics;  Laterality: Left;   TUBAL LIGATION      SOCIAL HISTORY: Social History   Socioeconomic History   Marital status: Married    Spouse name: Not on file   Number of children: Not on file   Years of education: Not on file   Highest education level: Not on  file  Occupational History   Not on file  Tobacco Use   Smoking status: Never   Smokeless tobacco: Never  Vaping Use   Vaping status: Never Used  Substance and Sexual Activity   Alcohol use: Yes    Comment: occasionally   Drug use: No   Sexual activity: Yes    Partners: Male    Birth control/protection: Post-menopausal  Other  Topics Concern   Not on file  Social History Narrative   Not on file   Social Drivers of Health   Financial Resource Strain: Low Risk  (08/20/2023)   Received from [facility]   Overall Financial Resource Strain (CARDIA)    Difficulty of Paying Living Expenses: Not hard at all  Food Insecurity: No Food Insecurity (08/20/2023)   Received [facility]   Hunger Vital Sign    Within the past 12 months, you worried that your food would run out before you got the money to buy more.: Never true    Within the past 12 months, the food you bought just didn't last and you didn't have money to get more.: Never true  Transportation Needs: No Transportation Needs (08/20/2023)   Received from [facility] - Transportation    In the past 12 months, has lack of transportation kept you from medical appointments or from getting medications?: No    Lack of Transportation (Non-Medical): No  Physical Activity: Not on file  Stress: Not on file  Social Connections: Not on file  Intimate Partner Violence: Not on file    FAMILY HISTORY: Family History  Problem Relation Age of Onset   Lung disease Mother    Breast cancer Paternal Aunt 21       x 2 (first time late 31s), same breast   Prostate cancer Paternal Uncle 18   Bone cancer Paternal Uncle 16   Brain cancer Paternal Uncle 84   Lung cancer Paternal Uncle 106   Asthma Father     ALLERGIES:  is allergic to nsaids, aspirin , and citrus.  MEDICATIONS:  Current Outpatient Medications  Medication Sig Dispense Refill   acetaminophen  (TYLENOL ) 650 MG CR tablet Take 650 mg by mouth 2 (two) times daily as needed for pain.     albuterol  (VENTOLIN  HFA) 108 (90 Base) MCG/ACT inhaler Inhale 1-2 puffs into the lungs every 4 (four) hours as needed for wheezing or shortness of breath. 1 each 0   Calcium Carbonate-Vitamin D  (CALCIUM + D PO) Take 1 tablet by mouth daily.     carboxymethylcellulose  (REFRESH PLUS) 0.5 % SOLN Apply to eye.     Cetirizine HCl 10 MG CAPS Take 1 capsule by mouth as needed. Seasonal allergies     ELDERBERRY PO Take by mouth.     eltrombopag  (PROMACTA ) 50 MG tablet Take 50 mg by mouth daily. Take on an empty stomach 1 hour before a meal or 2 hours after     Eltrombopag  Choline (ALVAIZ ) 36 MG TABS Take 1 tablet by mouth 2 (two) times a week. 8 tablet 6   fluticasone  (FLONASE ) 50 MCG/ACT nasal spray Place 2 sprays into both nostrils daily. 16 g 0   furosemide  (LASIX ) 40 MG tablet Take 40 mg by mouth daily.     lovastatin (MEVACOR) 10 MG tablet Take 10 mg by mouth daily.     Multiple Vitamin (MULTIVITAMIN WITH MINERALS) TABS tablet Take 1 tablet by mouth daily.     mupirocin  ointment (  BACTROBAN ) 2 % Apply 1 Application topically 2 (two) times daily. 22 g 0   Naphazoline HCl (CLEAR EYES OP) Place 1 drop into both eyes daily.     NON FORMULARY Use as directed 1 scoop in the mouth or throat daily. IT WORKS! (The Greens) pt mixes in water or fruit juice     Spacer/Aero-Holding Chambers (AEROCHAMBER MV) inhaler Use as instructed 1 each 1   Vitamin D , Ergocalciferol , (DRISDOL ) 1.25 MG (50000 UNIT) CAPS capsule TAKE 1 CAPSULE BY MOUTH ONCE  WEEKLY 13 capsule 3   buPROPion (WELLBUTRIN SR) 150 MG 12 hr tablet Take 150 mg by mouth daily.     No current facility-administered medications for this visit.     PHYSICAL EXAMINATION: ECOG PERFORMANCE STATUS: 0 - Asymptomatic  Vitals:   05/03/24 1414  BP: (!) 149/80  Pulse: 72  Resp: 20  Temp: 98.7 F (37.1 C)  SpO2: 100%   Filed Weights   05/03/24 1414  Weight: 234 lb 8 oz (106.4 kg)   Physical Exam Vitals reviewed.  Constitutional:      Appearance: She is not ill-appearing.  Cardiovascular:     Rate and Rhythm: Normal rate and regular rhythm.  Pulmonary:     Effort: Pulmonary effort is normal. No respiratory distress.  Abdominal:     General: There is no distension.     Palpations: Abdomen is soft.      Tenderness: There is no abdominal tenderness. There is no guarding.  Musculoskeletal:     Right lower leg: Edema present.     Left lower leg: Edema present.  Skin:    General: Skin is warm.  Neurological:     Mental Status: She is alert and oriented to person, place, and time.  Psychiatric:        Mood and Affect: Mood and affect normal.        Behavior: Behavior normal.    LABORATORY DATA:  I have reviewed the data as listed Lab Results  Component Value Date   WBC 6.0 05/03/2024   HGB 12.8 05/03/2024   HCT 36.8 05/03/2024   MCV 86.6 05/03/2024   PLT 53 (L) 05/03/2024   Recent Labs    11/02/23 0819 02/29/24 0813 05/03/24 1403  NA 137 139 136  K 3.8 3.6 4.0  CL 105 110 106  CO2 25 23 24   GLUCOSE 102* 110* 94  BUN 14 16 17   CREATININE 1.12* 1.27* 1.19*  CALCIUM 9.4 9.5 9.6  GFRNONAA 55* 47* 51*  PROT 7.2 7.3 7.2  ALBUMIN  3.9 3.9 3.9  AST 26 31 35  ALT 22 26 31   ALKPHOS 76 79 80  BILITOT 0.4 0.7 0.7    RADIOGRAPHIC STUDIES: I have personally reviewed the radiological images as listed and agreed with the findings in the report. No results found.  ASSESSMENT & PLAN:   No problem-specific Assessment & Plan notes found for this encounter.  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 On Promacta  since October 2016. Switched to Alvaiz  twice a week in March 2025 due to insurance. Plt was 158 at that time. Today, platelets 53. No bleeding or upcoming surgeries. Plan to continue current dose.  Requests 3 month supply.  CKD- stage III. GFR 46. Followed by Dr Dominica. Stable.  BLE edema- improved with lasix , elevation, and compression.    DISPOSITION:   6 mo- lab (cbc, cmp), Dr Rennie- [place]    All questions were answered. The patient knows  to call the clinic with any problems, questions or concerns.   Tinnie KANDICE Dawn, NP 05/03/2024   CC: Dr.Thies

## 2024-05-06 ENCOUNTER — Other Ambulatory Visit

## 2024-05-06 ENCOUNTER — Ambulatory Visit: Admitting: Internal Medicine

## 2024-06-09 ENCOUNTER — Other Ambulatory Visit: Payer: Self-pay | Admitting: Medical Genetics

## 2024-06-09 DIAGNOSIS — Z006 Encounter for examination for normal comparison and control in clinical research program: Secondary | ICD-10-CM

## 2024-06-27 ENCOUNTER — Other Ambulatory Visit: Payer: Self-pay | Admitting: Obstetrics and Gynecology

## 2024-06-27 ENCOUNTER — Telehealth: Payer: Self-pay | Admitting: Obstetrics and Gynecology

## 2024-06-27 DIAGNOSIS — R928 Other abnormal and inconclusive findings on diagnostic imaging of breast: Secondary | ICD-10-CM

## 2024-06-27 DIAGNOSIS — Z1231 Encounter for screening mammogram for malignant neoplasm of breast: Secondary | ICD-10-CM

## 2024-06-27 DIAGNOSIS — R921 Mammographic calcification found on diagnostic imaging of breast: Secondary | ICD-10-CM

## 2024-06-27 NOTE — Telephone Encounter (Signed)
 Patient would like to have an order put in for a mammogram.  Please advise the patient when this is complete.  Charlene

## 2024-06-27 NOTE — Telephone Encounter (Signed)
 Called patient, no answer, LVMTRC.

## 2024-06-27 NOTE — Telephone Encounter (Signed)
 Patient called back and is aware.

## 2024-06-27 NOTE — Telephone Encounter (Signed)
Order placed, pls notify pt

## 2024-07-15 ENCOUNTER — Telehealth: Payer: Self-pay | Admitting: Internal Medicine

## 2024-07-15 ENCOUNTER — Other Ambulatory Visit: Payer: Self-pay | Admitting: *Deleted

## 2024-07-15 DIAGNOSIS — D693 Immune thrombocytopenic purpura: Secondary | ICD-10-CM

## 2024-07-15 NOTE — Telephone Encounter (Signed)
 Called pt back to sched lab appt - pt requested appt be on Monday morning - sched w/pt - pt confirmed date/time - LH

## 2024-07-15 NOTE — Telephone Encounter (Signed)
 Pt called and requested lab to check platelets - said she felt like something was off - told her I would message the team and get back w/her to sched - [facility]

## 2024-07-18 ENCOUNTER — Ambulatory Visit: Payer: Self-pay | Admitting: Internal Medicine

## 2024-07-18 ENCOUNTER — Inpatient Hospital Stay: Attending: Internal Medicine

## 2024-07-18 DIAGNOSIS — D693 Immune thrombocytopenic purpura: Secondary | ICD-10-CM | POA: Diagnosis present

## 2024-07-18 LAB — CBC WITH DIFFERENTIAL (CANCER CENTER ONLY)
Abs Immature Granulocytes: 0.02 K/uL (ref 0.00–0.07)
Basophils Absolute: 0.1 K/uL (ref 0.0–0.1)
Basophils Relative: 1 %
Eosinophils Absolute: 0.4 K/uL (ref 0.0–0.5)
Eosinophils Relative: 6 %
HCT: 36.1 % (ref 36.0–46.0)
Hemoglobin: 12.8 g/dL (ref 12.0–15.0)
Immature Granulocytes: 0 %
Lymphocytes Relative: 21 %
Lymphs Abs: 1.3 K/uL (ref 0.7–4.0)
MCH: 30.6 pg (ref 26.0–34.0)
MCHC: 35.5 g/dL (ref 30.0–36.0)
MCV: 86.4 fL (ref 80.0–100.0)
Monocytes Absolute: 0.6 K/uL (ref 0.1–1.0)
Monocytes Relative: 10 %
Neutro Abs: 3.9 K/uL (ref 1.7–7.7)
Neutrophils Relative %: 62 %
Platelet Count: 89 K/uL — ABNORMAL LOW (ref 150–400)
RBC: 4.18 MIL/uL (ref 3.87–5.11)
RDW: 13.3 % (ref 11.5–15.5)
WBC Count: 6.2 K/uL (ref 4.0–10.5)
nRBC: 0 % (ref 0.0–0.2)

## 2024-07-18 LAB — CMP (CANCER CENTER ONLY)
ALT: 30 U/L (ref 0–44)
AST: 43 U/L — ABNORMAL HIGH (ref 15–41)
Albumin: 4.2 g/dL (ref 3.5–5.0)
Alkaline Phosphatase: 89 U/L (ref 38–126)
Anion gap: 9 (ref 5–15)
BUN: 17 mg/dL (ref 8–23)
CO2: 26 mmol/L (ref 22–32)
Calcium: 9.8 mg/dL (ref 8.9–10.3)
Chloride: 106 mmol/L (ref 98–111)
Creatinine: 1.15 mg/dL — ABNORMAL HIGH (ref 0.44–1.00)
GFR, Estimated: 52 mL/min — ABNORMAL LOW
Glucose, Bld: 116 mg/dL — ABNORMAL HIGH (ref 70–99)
Potassium: 3.7 mmol/L (ref 3.5–5.1)
Sodium: 141 mmol/L (ref 135–145)
Total Bilirubin: 0.5 mg/dL (ref 0.0–1.2)
Total Protein: 7.2 g/dL (ref 6.5–8.1)

## 2024-07-24 LAB — GENECONNECT MOLECULAR SCREEN

## 2024-07-25 ENCOUNTER — Telehealth: Payer: Self-pay | Admitting: Medical Genetics

## 2024-07-25 DIAGNOSIS — Z006 Encounter for examination for normal comparison and control in clinical research program: Secondary | ICD-10-CM

## 2024-07-26 NOTE — Telephone Encounter (Signed)
 [facility]  07/26/2024 1:54 PM  Confirmed I was speaking with Victoria Morgan [phone] by using name and DOB. Informed participant the reason for this call is to follow-up on a recent sample the participant provided at one of the [facility] lab locations. Informed participant the test was not able to be completed with this sample and apologized for the inconvenience. Participant was requested to provide a new sample at one of our participating labs at no cost so that participant can continue participation and receive test results. Informed participant they do not need to be fasting and if there are other samples that need to be drawn, they can be done at the same visit. Participant has not had a blood transfusion or blood product in the last 30 days. Participant agreed to provide another sample. Participant was provided the [employer] program website to learn why this may have happened. Participant was thanked for their time and continued support of the above study.    Jordyn Pennstrom, BS [place]  [facility] Clinical Research Specialist II Direct Dial: [phone]  Fax: [phone]

## 2024-08-05 ENCOUNTER — Other Ambulatory Visit: Payer: Self-pay

## 2024-08-05 ENCOUNTER — Ambulatory Visit: Admission: EM | Admit: 2024-08-05 | Discharge: 2024-08-05 | Disposition: A | Discharge status: Home / Self Care

## 2024-08-05 DIAGNOSIS — R6889 Other general symptoms and signs: Secondary | ICD-10-CM

## 2024-08-05 DIAGNOSIS — J069 Acute upper respiratory infection, unspecified: Secondary | ICD-10-CM | POA: Diagnosis not present

## 2024-08-05 MED ORDER — PROMETHAZINE-DM 6.25-15 MG/5ML PO SYRP: 5.0000 mL | ORAL_SOLUTION | Freq: Four times a day (QID) | ORAL | 0 refills | Status: AC | PRN

## 2024-08-05 MED ORDER — BENZONATATE 100 MG PO CAPS: 200.0000 mg | ORAL_CAPSULE | Freq: Three times a day (TID) | ORAL | 0 refills | Status: AC

## 2024-08-05 MED ORDER — IPRATROPIUM BROMIDE 0.06 % NA SOLN: 2.0000 | Freq: Four times a day (QID) | NASAL | 12 refills | Status: AC

## 2024-08-05 MED ORDER — AMOXICILLIN-POT CLAVULANATE 875-125 MG PO TABS: 1.0000 | ORAL_TABLET | Freq: Two times a day (BID) | ORAL | 0 refills | Status: AC

## 2024-08-05 NOTE — Discharge Instructions (Addendum)
 Take the Augmentin  twice daily with food for 7 days for treatment of your URI.  Use over-the-counter Tylenol  according to the package instructions as needed for any fever or pain.  Use the Atrovent  nasal spray, 2 squirts in each nostril every 6 hours, as needed for runny nose and postnasal drip.  Use the Tessalon  Perles every 8 hours during the day.  Take them with a small sip of water.  They may give you some numbness to the base of your tongue or a metallic taste in your mouth, this is normal.  Use the Promethazine  DM cough syrup at bedtime for cough and congestion.  It will make you drowsy so do not take it during the day.  Return for reevaluation or see your primary care provider for any new or worsening symptoms.

## 2024-08-05 NOTE — ED Triage Notes (Signed)
 Pt c/o productive cough w/yellow mucous, sore throat, fatiguex2d.

## 2024-08-05 NOTE — ED Provider Notes (Signed)
 " [facility]    CSN: [id number] Arrival date & time: 08/05/24  1436      History   Chief Complaint No chief complaint on file.   HPI Victoria Morgan is a 68 y.o. female.   HPI  68 year old female with past medical history significant for osteoarthritis, thrombocytopenia, high cholesterol, lymphedema, chronic venous insufficiency, and asthma presents for evaluation of respiratory symptoms that began 14 days ago and consist of fatigue, runny nose, nasal congestion, sore throat, productive cough of yellow sputum, and wheezing.  She denies fever or shortness of breath.  Past Medical History:  Diagnosis Date   Asthma    Blood dyscrasia    ITP   dx in 1990   BRCA negative 08/2018   MyRisk neg except AXIN2/RNF43 VUS   Chronic kidney disease    followed at [facility] history of breast cancer 08/2018   IBIS=19.9%   History of arthritis    in hips; now resolved after hip surgery   ITP (idiopathic thrombocytopenic purpura) 05/01/2015    Patient Active Problem List   Diagnosis Date Noted   Asymptomatic proteinuria 04/26/2019   Family history of breast cancer 08/13/2018   Lymphedema 11/21/2017   Chronic venous insufficiency 11/21/2017   DJD (degenerative joint disease) 11/21/2017   Asthma 11/21/2017   CRI (chronic renal insufficiency), stage 3 (moderate) 10/09/2017   Primary osteoarthritis of both knees 10/09/2017   Rotator cuff disorder, right 05/27/2017   High risk medication use 05/27/2016   BMI 40.0-44.9, adult (HCC) 02/15/2016   Idiopathic thrombocytopenic purpura (HCC) 05/01/2015   Allergic to food 03/20/2015   H/O splenectomy 12/07/2014   Osteoarthritis of left hip 05/15/2014   Accumulation of fluid in tissues 03/09/2014   Chronic low back pain 03/09/2014   Dermatitis, eczematoid 03/09/2014   Hypercholesterolemia without hypertriglyceridemia 03/09/2014   AS (sickle cell trait) 03/09/2014   Left lumbar radiculopathy 03/09/2014    History of total replacement of both hip joints 03/09/2014   Osteoarthritis of right hip 11/28/2013   Acute blood loss anemia 11/28/2013   Thrombocytopenia 11/28/2013    Past Surgical History:  Procedure Laterality Date   BREAST EXCISIONAL BIOPSY Left    benign years ago   COLONOSCOPY  09/18/2008   Dr. Viktoria   COLONOSCOPY WITH PROPOFOL  N/A 02/23/2019   Procedure: COLONOSCOPY WITH PROPOFOL ;  Surgeon: Toledo, Ladell POUR, MD;  Location: [facility] END[facility];  Service: Endoscopy;  Laterality: N/A;   HAND SURGERY     right hand   spleenectomy  1991   x 2   TOTAL HIP ARTHROPLASTY Right 11/28/2013   Procedure: TOTAL HIP ARTHROPLASTY ANTERIOR APPROACH;  Surgeon: Norleen LITTIE Gavel, MD;  Location: [facility] OR;  Service: Orthopedics;  Laterality: Right;  Right total hip arthroplasty anterior approach   TOTAL HIP ARTHROPLASTY Left 05/15/2014   Procedure: TOTAL HIP ARTHROPLASTY ANTERIOR APPROACH;  Surgeon: Norleen LITTIE Gavel, MD;  Location: [facility] OR;  Service: Orthopedics;  Laterality: Left;   TUBAL LIGATION      OB History     Gravida  2   Para  2   Term  2   Preterm      AB      Living  2      SAB      IAB      Ectopic      Multiple      Live Births  2            Home Medications  Prior to Admission medications  Medication Sig Start Date End Date Taking? Authorizing Provider  amoxicillin -clavulanate (AUGMENTIN ) 875-125 MG tablet Take 1 tablet by mouth every 12 (twelve) hours for 7 days. 08/05/24 08/12/24 Yes Bernardino Ditch, NP  benzonatate  (TESSALON ) 100 MG capsule Take 2 capsules (200 mg total) by mouth every 8 (eight) hours. 08/05/24  Yes Bernardino Ditch, NP  ipratropium (ATROVENT ) 0.06 % nasal spray Place 2 sprays into both nostrils 4 (four) times daily. 08/05/24  Yes Bernardino Ditch, NP  promethazine -dextromethorphan (PROMETHAZINE -DM) 6.25-15 MG/5ML syrup Take 5 mLs by mouth 4 (four) times daily as needed. 08/05/24  Yes Bernardino Ditch, NP  acetaminophen  (TYLENOL ) 650 MG CR tablet Take 650 mg  by mouth 2 (two) times daily as needed for pain.    [provider]  albuterol  (VENTOLIN  HFA) 108 (90 Base) MCG/ACT inhaler Inhale 1-2 puffs into the lungs every 4 (four) hours as needed for wheezing or shortness of breath. 02/09/22   Van Knee, MD  Calcium Carbonate-Vitamin D  (CALCIUM + D PO) Take 1 tablet by mouth daily.    [provider]  carboxymethylcellulose (REFRESH PLUS) 0.5 % SOLN Apply to eye.    [provider]  Cetirizine HCl 10 MG CAPS Take 1 capsule by mouth as needed. Seasonal allergies    [provider]  ELDERBERRY PO Take by mouth.    [provider]  ESTROGENS CONJUGATED PO Take by mouth.    [provider]  fluticasone  (FLONASE ) 50 MCG/ACT nasal spray Place 2 sprays into both nostrils daily. 02/09/22   Mortenson, Ashley, MD  furosemide  (LASIX ) 40 MG tablet Take 40 mg by mouth daily. 04/29/22   [provider]  lovastatin (MEVACOR) 10 MG tablet Take 10 mg by mouth daily. 07/30/21   [provider]  Multiple Vitamin (MULTIVITAMIN WITH MINERALS) TABS tablet Take 1 tablet by mouth daily.    [provider]  mupirocin  ointment (BACTROBAN ) 2 % Apply 1 Application topically 2 (two) times daily. 10/14/22   Raspet, Erin K, PA-C  Naphazoline HCl (CLEAR EYES OP) Place 1 drop into both eyes daily.    [provider]  NON FORMULARY Use as directed 1 scoop in the mouth or throat daily. IT WORKS! ([employer]) pt mixes in water or fruit juice    [provider]  Spacer/Aero-Holding Chambers (AEROCHAMBER MV) inhaler Use as instructed 02/09/22   Van Knee, MD  Vitamin D , Ergocalciferol , (DRISDOL ) 1.25 MG (50000 UNIT) CAPS capsule TAKE 1 CAPSULE BY MOUTH ONCE  WEEKLY 12/15/23   Dasie Tinnie MATSU, NP    Family History Family History  Problem Relation Age of Onset   Lung disease Mother    Breast cancer Paternal Aunt 48       x 2 (first time late 40s), same breast   Prostate cancer Paternal  Uncle 20   Bone cancer Paternal Uncle 81   Brain cancer Paternal Uncle 40   Lung cancer Paternal Uncle 68   Asthma Father     Social History Social History[1]   Allergies   Nsaids, Aspirin , and Citrus   Review of Systems Review of Systems  Constitutional:  Positive for fatigue. Negative for fever.  HENT:  Positive for congestion, rhinorrhea and sore throat. Negative for ear pain.   Respiratory:  Positive for cough and wheezing. Negative for shortness of breath.      Physical Exam Triage Vital Signs ED Triage Vitals [08/05/24 1601]  Encounter Vitals Group     BP  Girls Systolic BP Percentile      Girls Diastolic BP Percentile      Boys Systolic BP Percentile      Boys Diastolic BP Percentile      Pulse      Resp      Temp      Temp src      SpO2      Weight      Height      Head Circumference      Peak Flow      Pain Score 0     Pain Loc      Pain Education      Exclude from Growth Chart    No data found.  Updated Vital Signs BP (!) 179/91   Pulse 65   Temp 97.6 F (36.4 C) (Oral)   Resp 16   SpO2 100%   Visual Acuity Right Eye Distance:   Left Eye Distance:   Bilateral Distance:    Right Eye Near:   Left Eye Near:    Bilateral Near:     Physical Exam Vitals and nursing note reviewed.  Constitutional:      Appearance: Normal appearance. She is not ill-appearing.  HENT:     Head: Normocephalic and atraumatic.     Right Ear: Tympanic membrane, ear canal and external ear normal. There is no impacted cerumen.     Left Ear: Tympanic membrane, ear canal and external ear normal. There is no impacted cerumen.     Nose: Congestion and rhinorrhea present.     Comments: Nasal mucosa is edematous and erythematous with yellow discharge in both nares.    Mouth/Throat:     Mouth: Mucous membranes are moist.     Pharynx: Oropharynx is clear. Posterior oropharyngeal erythema present. No oropharyngeal exudate.     Comments: Mild erythema to the posterior  oropharynx with yellow postnasal drip. Cardiovascular:     Rate and Rhythm: Normal rate and regular rhythm.     Pulses: Normal pulses.     Heart sounds: Normal heart sounds. No murmur heard.    No friction rub. No gallop.  Pulmonary:     Effort: Pulmonary effort is normal.     Breath sounds: Normal breath sounds. No wheezing, rhonchi or rales.  Musculoskeletal:     Cervical back: Normal range of motion and neck supple. No tenderness.  Lymphadenopathy:     Cervical: No cervical adenopathy.  Skin:    General: Skin is warm and dry.     Capillary Refill: Capillary refill takes less than 2 seconds.     Findings: No erythema or rash.  Neurological:     General: No focal deficit present.     Mental Status: She is alert and oriented to person, place, and time.      UC Treatments / Results  Labs (all labs ordered are listed, but only abnormal results are displayed) Labs Reviewed - No data to display  EKG   Radiology No results found.  Procedures Procedures (including critical care time)  Medications Ordered in UC Medications - No data to display  Initial Impression / Assessment and Plan / UC Course  I have reviewed the triage vital signs and the nursing notes.  Pertinent labs & imaging results that were available during my care of the patient were reviewed by me and considered in my medical decision making (see chart for details).   Patient is a nontoxic-appearing 68 year old female presenting for evaluation 2 weeks with respiratory  symptoms as outlined HPI above.  She reports that she is concerned that she may have bronchitis as she gets that every year at this time a year.  She is not experiencing any shortness of breath but she does endorse wheezing.  However, she also reports wheezing at baseline due to medication she takes for her thrombocytopenia.  Her lungs are clear to auscultation in all fields.  Given that she has had symptoms for 2 weeks I will not test her for  influenza or COVID at this time.  The fact that her lungs are clear I will not obtain a chest x-ray as I do not think 1 is warranted.  She does have yellow discharge in both nares as well as yellow postnasal drip which I believe accounts for the yellow sputum production when she coughs.  I will treat her for a URI with cough and congestion with a 7-day course of Augmentin .  Also Atrovent  nasal spray for her nasal congestion and postnasal drip.  Tessalon  Perles and Promethazine  DM cough syrup for cough and congestion.  Return precautions reviewed.   Final Clinical Impressions(s) / UC Diagnoses   Final diagnoses:  Influenza-like symptoms  URI with cough and congestion     Discharge Instructions      Take the Augmentin  twice daily with food for 7 days for treatment of your URI.  Use over-the-counter Tylenol  according to the package instructions as needed for any fever or pain.  Use the Atrovent  nasal spray, 2 squirts in each nostril every 6 hours, as needed for runny nose and postnasal drip.  Use the Tessalon  Perles every 8 hours during the day.  Take them with a small sip of water.  They may give you some numbness to the base of your tongue or a metallic taste in your mouth, this is normal.  Use the Promethazine  DM cough syrup at bedtime for cough and congestion.  It will make you drowsy so do not take it during the day.  Return for reevaluation or see your primary care provider for any new or worsening symptoms.      ED Prescriptions     Medication Sig Dispense Auth. Provider   amoxicillin -clavulanate (AUGMENTIN ) 875-125 MG tablet Take 1 tablet by mouth every 12 (twelve) hours for 7 days. 14 tablet Bernardino Ditch, NP   benzonatate  (TESSALON ) 100 MG capsule Take 2 capsules (200 mg total) by mouth every 8 (eight) hours. 21 capsule Bernardino Ditch, NP   ipratropium (ATROVENT ) 0.06 % nasal spray Place 2 sprays into both nostrils 4 (four) times daily. 15 mL Bernardino Ditch, NP    promethazine -dextromethorphan (PROMETHAZINE -DM) 6.25-15 MG/5ML syrup Take 5 mLs by mouth 4 (four) times daily as needed. 118 mL Bernardino Ditch, NP      PDMP not reviewed this encounter.    [1]  Social History Tobacco Use   Smoking status: Never   Smokeless tobacco: Never  Vaping Use   Vaping status: Never Used  Substance Use Topics   Alcohol use: Yes    Comment: occasionally   Drug use: No     Bernardino Ditch, NP 08/05/24 1618  "

## 2024-08-13 ENCOUNTER — Other Ambulatory Visit
Admission: RE | Admit: 2024-08-13 | Discharge: 2024-08-13 | Disposition: A | Discharge status: Home / Self Care | Payer: Self-pay | Source: Ambulatory Visit | Attending: Medical Genetics | Admitting: Medical Genetics

## 2024-08-13 DIAGNOSIS — Z006 Encounter for examination for normal comparison and control in clinical research program: Secondary | ICD-10-CM | POA: Insufficient documentation

## 2024-08-19 ENCOUNTER — Ambulatory Visit
Admission: RE | Admit: 2024-08-19 | Discharge: 2024-08-19 | Disposition: A | Source: Ambulatory Visit | Attending: Obstetrics and Gynecology

## 2024-08-19 DIAGNOSIS — R928 Other abnormal and inconclusive findings on diagnostic imaging of breast: Secondary | ICD-10-CM

## 2024-08-19 DIAGNOSIS — R921 Mammographic calcification found on diagnostic imaging of breast: Secondary | ICD-10-CM

## 2024-08-19 DIAGNOSIS — Z1231 Encounter for screening mammogram for malignant neoplasm of breast: Secondary | ICD-10-CM | POA: Diagnosis present

## 2024-08-20 ENCOUNTER — Ambulatory Visit: Payer: Self-pay | Admitting: Obstetrics and Gynecology

## 2024-08-24 LAB — GENECONNECT MOLECULAR SCREEN: Genetic Analysis Overall Interpretation: NEGATIVE

## 2024-08-29 ENCOUNTER — Other Ambulatory Visit: Payer: Self-pay | Admitting: *Deleted

## 2024-08-29 DIAGNOSIS — D693 Immune thrombocytopenic purpura: Secondary | ICD-10-CM

## 2024-08-29 MED ORDER — ALVAIZ 36 MG PO TABS: 1.0000 | ORAL_TABLET | ORAL | 2 refills | Status: DC

## 2024-08-29 NOTE — Telephone Encounter (Signed)
 Patient requesting Rf on eltrombopag  (Alvaiz ). She only has 1 tablet left that she will take on Thursday. Pt is requesting a 30 days supply. She stated that the mail order pharmacy will be faxing a request.

## 2024-09-01 ENCOUNTER — Telehealth: Payer: Self-pay | Admitting: *Deleted

## 2024-09-01 ENCOUNTER — Other Ambulatory Visit: Payer: Self-pay | Admitting: *Deleted

## 2024-09-01 DIAGNOSIS — D693 Immune thrombocytopenic purpura: Secondary | ICD-10-CM

## 2024-09-01 MED ORDER — ALVAIZ 36 MG PO TABS: 1.0000 | ORAL_TABLET | ORAL | 2 refills | Status: DC

## 2024-09-01 NOTE — Telephone Encounter (Signed)
 Received message that [facility] is not the preferred pharmacy for her prescription Alvaiz . [facility] is prefereed. Prescription sent to [facility].

## 2024-09-02 ENCOUNTER — Telehealth: Payer: Self-pay | Admitting: *Deleted

## 2024-09-02 ENCOUNTER — Other Ambulatory Visit: Payer: Self-pay | Admitting: *Deleted

## 2024-09-02 DIAGNOSIS — D693 Immune thrombocytopenic purpura: Secondary | ICD-10-CM

## 2024-09-02 MED ORDER — ALVAIZ 36 MG PO TABS: 1.0000 | ORAL_TABLET | ORAL | 2 refills | Status: AC

## 2024-09-02 NOTE — Telephone Encounter (Signed)
 Caller verified using pt's full name and dob prior to discussing PHI.  Patient has not received her Alvaiz  medication. I informed her that Rosaline PEAK documented that [facility] would not fill the script and the script had to be filled by [facility]. Rosaline routed the script yeterday and we have receipt  Receipt confirmed by pharmacy on 09/01/2024 12:32 PM. Pt stated that she had no idea that [facility] was no longer in her network and her insurance did not share this with her on the new plan. She thanked me for calling her and she will personally reach out the the [facility].

## 2024-10-31 ENCOUNTER — Other Ambulatory Visit

## 2024-10-31 ENCOUNTER — Ambulatory Visit: Admitting: Internal Medicine
